# Patient Record
Sex: Female | Born: 1950 | Race: White | Hispanic: No | Smoking: Former smoker
Health system: Southern US, Community
[De-identification: ages and names within clinical notes are randomized; demographics above are authoritative.]

## PROBLEM LIST (undated history)

## (undated) DIAGNOSIS — J449 Chronic obstructive pulmonary disease, unspecified: Secondary | ICD-10-CM

## (undated) DIAGNOSIS — K529 Noninfective gastroenteritis and colitis, unspecified: Secondary | ICD-10-CM

## (undated) DIAGNOSIS — I10 Essential (primary) hypertension: Secondary | ICD-10-CM

## (undated) DIAGNOSIS — M810 Age-related osteoporosis without current pathological fracture: Secondary | ICD-10-CM

## (undated) HISTORY — DX: Age-related osteoporosis without current pathological fracture: M81.0

## (undated) HISTORY — PX: ELBOW SURGERY: SHX618

## (undated) HISTORY — DX: Chronic obstructive pulmonary disease, unspecified: J44.9

## (undated) HISTORY — PX: FOOT SURGERY: SHX648

## (undated) HISTORY — DX: Noninfective gastroenteritis and colitis, unspecified: K52.9

## (undated) HISTORY — PX: HAND SURGERY: SHX662

## (undated) HISTORY — PX: ABDOMINAL HYSTERECTOMY: SHX81

---

## 1997-10-08 ENCOUNTER — Emergency Department (HOSPITAL_COMMUNITY): Admission: EM | Admit: 1997-10-08 | Discharge: 1997-10-08 | Payer: Self-pay | Admitting: Emergency Medicine

## 1997-10-09 ENCOUNTER — Emergency Department (HOSPITAL_COMMUNITY): Admission: EM | Admit: 1997-10-09 | Discharge: 1997-10-09 | Payer: Self-pay | Admitting: Emergency Medicine

## 1997-10-11 ENCOUNTER — Encounter (HOSPITAL_COMMUNITY): Admission: RE | Admit: 1997-10-11 | Discharge: 1998-01-09 | Payer: Self-pay | Admitting: Emergency Medicine

## 1998-02-15 ENCOUNTER — Ambulatory Visit: Admission: RE | Admit: 1998-02-15 | Discharge: 1998-02-15 | Payer: Self-pay | Admitting: Family Medicine

## 1998-04-08 ENCOUNTER — Ambulatory Visit: Admission: RE | Admit: 1998-04-08 | Discharge: 1998-04-08 | Payer: Self-pay | Admitting: Family Medicine

## 1998-04-08 ENCOUNTER — Encounter: Payer: Self-pay | Admitting: Family Medicine

## 1998-10-27 ENCOUNTER — Ambulatory Visit (HOSPITAL_BASED_OUTPATIENT_CLINIC_OR_DEPARTMENT_OTHER): Admission: RE | Admit: 1998-10-27 | Discharge: 1998-10-27 | Payer: Self-pay | Admitting: Orthopedic Surgery

## 1999-03-15 ENCOUNTER — Encounter: Payer: Self-pay | Admitting: Family Medicine

## 1999-03-15 ENCOUNTER — Ambulatory Visit (HOSPITAL_COMMUNITY): Admission: RE | Admit: 1999-03-15 | Discharge: 1999-03-15 | Payer: Self-pay | Admitting: Family Medicine

## 1999-11-07 ENCOUNTER — Ambulatory Visit (HOSPITAL_BASED_OUTPATIENT_CLINIC_OR_DEPARTMENT_OTHER): Admission: RE | Admit: 1999-11-07 | Discharge: 1999-11-08 | Payer: Self-pay | Admitting: Orthopedic Surgery

## 2000-03-27 ENCOUNTER — Ambulatory Visit (HOSPITAL_COMMUNITY): Admission: RE | Admit: 2000-03-27 | Discharge: 2000-03-27 | Payer: Self-pay | Admitting: *Deleted

## 2000-03-27 ENCOUNTER — Encounter: Payer: Self-pay | Admitting: *Deleted

## 2001-04-24 ENCOUNTER — Encounter: Payer: Self-pay | Admitting: Family Medicine

## 2001-04-24 ENCOUNTER — Ambulatory Visit (HOSPITAL_COMMUNITY): Admission: RE | Admit: 2001-04-24 | Discharge: 2001-04-24 | Payer: Self-pay | Admitting: Family Medicine

## 2002-06-03 ENCOUNTER — Ambulatory Visit (HOSPITAL_COMMUNITY): Admission: RE | Admit: 2002-06-03 | Discharge: 2002-06-03 | Payer: Self-pay | Admitting: *Deleted

## 2002-06-03 ENCOUNTER — Encounter: Payer: Self-pay | Admitting: *Deleted

## 2002-07-08 ENCOUNTER — Encounter: Admission: RE | Admit: 2002-07-08 | Discharge: 2002-10-06 | Payer: Self-pay | Admitting: Family Medicine

## 2002-11-06 ENCOUNTER — Ambulatory Visit (HOSPITAL_BASED_OUTPATIENT_CLINIC_OR_DEPARTMENT_OTHER): Admission: RE | Admit: 2002-11-06 | Discharge: 2002-11-06 | Payer: Self-pay | Admitting: Orthopedic Surgery

## 2003-06-09 ENCOUNTER — Ambulatory Visit (HOSPITAL_COMMUNITY): Admission: RE | Admit: 2003-06-09 | Discharge: 2003-06-09 | Payer: Self-pay | Admitting: *Deleted

## 2003-07-21 ENCOUNTER — Ambulatory Visit (HOSPITAL_COMMUNITY): Admission: RE | Admit: 2003-07-21 | Discharge: 2003-07-21 | Payer: Self-pay | Admitting: Gastroenterology

## 2003-09-15 ENCOUNTER — Ambulatory Visit (HOSPITAL_BASED_OUTPATIENT_CLINIC_OR_DEPARTMENT_OTHER): Admission: RE | Admit: 2003-09-15 | Discharge: 2003-09-15 | Payer: Self-pay | Admitting: Family Medicine

## 2004-01-25 ENCOUNTER — Emergency Department (HOSPITAL_COMMUNITY): Admission: EM | Admit: 2004-01-25 | Discharge: 2004-01-25 | Payer: Self-pay | Admitting: Emergency Medicine

## 2004-07-18 ENCOUNTER — Ambulatory Visit (HOSPITAL_COMMUNITY): Admission: RE | Admit: 2004-07-18 | Discharge: 2004-07-18 | Payer: Self-pay | Admitting: *Deleted

## 2009-05-14 HISTORY — PX: ELBOW SURGERY: SHX618

## 2010-11-13 ENCOUNTER — Encounter (HOSPITAL_BASED_OUTPATIENT_CLINIC_OR_DEPARTMENT_OTHER)
Admission: RE | Admit: 2010-11-13 | Discharge: 2010-11-13 | Disposition: A | Discharge status: Home / Self Care | Payer: 59 | Source: Ambulatory Visit | Attending: Orthopedic Surgery | Admitting: Orthopedic Surgery

## 2010-11-13 LAB — BASIC METABOLIC PANEL
BUN: 8 mg/dL (ref 6–23)
CO2: 29 mEq/L (ref 19–32)
Chloride: 98 mEq/L (ref 96–112)
Glucose, Bld: 100 mg/dL — ABNORMAL HIGH (ref 70–99)
Potassium: 3.8 mEq/L (ref 3.5–5.1)
Sodium: 136 mEq/L (ref 135–145)

## 2010-11-16 ENCOUNTER — Other Ambulatory Visit: Payer: Self-pay | Admitting: Orthopedic Surgery

## 2010-11-16 ENCOUNTER — Ambulatory Visit (HOSPITAL_BASED_OUTPATIENT_CLINIC_OR_DEPARTMENT_OTHER)
Admission: RE | Admit: 2010-11-16 | Discharge: 2010-11-16 | Disposition: A | Discharge status: Home / Self Care | Payer: 59 | Source: Ambulatory Visit | Attending: Orthopedic Surgery | Admitting: Orthopedic Surgery

## 2010-11-16 DIAGNOSIS — M6530 Trigger finger, unspecified finger: Secondary | ICD-10-CM | POA: Insufficient documentation

## 2010-11-16 DIAGNOSIS — Z01812 Encounter for preprocedural laboratory examination: Secondary | ICD-10-CM | POA: Insufficient documentation

## 2010-11-16 DIAGNOSIS — F17200 Nicotine dependence, unspecified, uncomplicated: Secondary | ICD-10-CM | POA: Insufficient documentation

## 2010-11-16 DIAGNOSIS — I10 Essential (primary) hypertension: Secondary | ICD-10-CM | POA: Insufficient documentation

## 2010-11-16 DIAGNOSIS — Z01810 Encounter for preprocedural cardiovascular examination: Secondary | ICD-10-CM | POA: Insufficient documentation

## 2010-11-16 DIAGNOSIS — G4733 Obstructive sleep apnea (adult) (pediatric): Secondary | ICD-10-CM | POA: Insufficient documentation

## 2010-11-16 DIAGNOSIS — F3289 Other specified depressive episodes: Secondary | ICD-10-CM | POA: Insufficient documentation

## 2010-11-16 DIAGNOSIS — F329 Major depressive disorder, single episode, unspecified: Secondary | ICD-10-CM | POA: Insufficient documentation

## 2010-11-16 DIAGNOSIS — M659 Unspecified synovitis and tenosynovitis, unspecified site: Secondary | ICD-10-CM | POA: Insufficient documentation

## 2010-11-24 NOTE — Op Note (Signed)
Dawn Lowery, Dawn Lowery                 ACCOUNT NO.:  [id number]  MEDICAL RECORD NO.:  [id number]  LOCATION:                                 FACILITY:  PHYSICIAN:  Katy Fitch. Draysen Weygandt, M.D.      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  OPERATIONS: 1. Release of right long finger A1 pulley with synovectomy with     superficialis profundus tendons for synovial biopsy. 2. Release of right ring finger A1 pulley with limited synovectomy.  OPERATIONS:  Katy Fitch. Luellen Howson, MD  ASSISTANT:  Marveen Reeks Dasnoit, PA-C  ANESTHESIA:  General sedation/2% lidocaine flexor sheath block and palm block right long and ring fingers.  TOTAL VOLUME:  4.5 mL.  SUPERVISING ANESTHESIOLOGIST:  Janetta Hora. Gelene Mink, MD  INDICATIONS:  Tanaiya Kolarik is a 60 year old woman well acquainted with our practice.  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  She presented for evaluation and management of trigger fingers and hand numbness.  In April 2012, we performed detailed electrodiagnostic studies which did not reveal evidence of entrapment neuropathy.  Ms. 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  To date her evaluation of the symptoms has been nondiagnostic.  She has been treated with steroid injection without success.  Due to a failed respond to nonoperative measures, she is brought to the operating room at this time anticipating release of her right long and ring finger A1 pulleys.  Questions were invited and answered in detail in the preoperative holding  area.  PROCEDURE:  Kearie Mennen is brought to room 2 of the [facility] and placed in supine position upon the operating table.  Following preoperative evaluation by Dr. Gelene Mink, general anesthesia was declined and local anesthesia with sedation recommended and accepted.  Under Dr. Gelene Mink supervision, IV sedation was provided and followed by routine Betadine prep of the right hand.  Lidocaine 2% was infiltrated into the path intended incisions and into the flexor sheaths of the right long and ring fingers.  After few moments, excellent anesthesia was achieved.  The right arm was then prepped with Betadine soap solution and sterilely draped.  A pneumatic tourniquet was applied to proximal brachium. Following exsanguination of the right arm with Esmarch bandage, arterial tourniquet was inflated to 120 mmHg.  After routine surgical time-out, short oblique incisions were fashioned directly over the palpably thickened A1 pulleys.  The pretendinous fibers of the palmar fascia was released to the long and ring fingers.  Inspection of the flexor sheaths at the A1 pulley revealed rather significant tenosynovitis proximal to the A1 pulleys of both the long and ring fingers.  The A1 pulley was isolated, split with scalpel and scissors and a proximal cuff of tenosynovitis resected with scissors dissection from the superficialis profundus tendons of the long and ring fingers.  The specimen from  the long finger was placed in formalin and passed off the pathologic evaluation.  There was minor tendinopathy of the superficialis tendon.  After completion of the A1 pulley release and synovectomy, Ms. Evon demonstrated full active range of motion of her fingers without residual triggering.  Careful inspection of the index and small fingers revealed a slight crepitation suggesting synovitis in these tendon sheath as well.  The wounds were then inspected for bleeding points and  repaired with mattress sutures of 5-0 nylon.  Ms. Yano was placed in compressive dressing with Xeroflo sterile gauze and Ace wrap.  For aftercare were going to advise immediate range of motion excises.  We are going to suggest that her primary care physician consider a further evaluation for possible inflammatory arthritis. Question invited and answered in detail.  She is provided prescription for Dilaudid 2 mg 1 p.o. q.4-6 h. p.r.n. pain 20 tablets without refill.     Katy Fitch Neel Buffone, M.D.    RVS/MEDQ  D:  11/16/2010  T:  11/16/2010  Job:  [id number]  Electronically Signed by Josephine Igo M.D. on 11/24/2010 08:40:01 AM

## 2017-12-12 ENCOUNTER — Emergency Department (HOSPITAL_BASED_OUTPATIENT_CLINIC_OR_DEPARTMENT_OTHER)
Admission: EM | Admit: 2017-12-12 | Discharge: 2017-12-12 | Disposition: A | Discharge status: Home / Self Care | Payer: Self-pay | Attending: Emergency Medicine | Admitting: Emergency Medicine

## 2017-12-12 ENCOUNTER — Other Ambulatory Visit: Payer: Self-pay

## 2017-12-12 ENCOUNTER — Encounter (HOSPITAL_BASED_OUTPATIENT_CLINIC_OR_DEPARTMENT_OTHER): Payer: Self-pay

## 2017-12-12 DIAGNOSIS — I159 Secondary hypertension, unspecified: Secondary | ICD-10-CM | POA: Insufficient documentation

## 2017-12-12 DIAGNOSIS — Z79899 Other long term (current) drug therapy: Secondary | ICD-10-CM | POA: Insufficient documentation

## 2017-12-12 DIAGNOSIS — N390 Urinary tract infection, site not specified: Secondary | ICD-10-CM | POA: Insufficient documentation

## 2017-12-12 DIAGNOSIS — F17210 Nicotine dependence, cigarettes, uncomplicated: Secondary | ICD-10-CM | POA: Insufficient documentation

## 2017-12-12 HISTORY — DX: Essential (primary) hypertension: I10

## 2017-12-12 LAB — BASIC METABOLIC PANEL
Anion gap: 9 (ref 5–15)
BUN: 9 mg/dL (ref 8–23)
CALCIUM: 9.5 mg/dL (ref 8.9–10.3)
CO2: 30 mmol/L (ref 22–32)
Chloride: 101 mmol/L (ref 98–111)
Creatinine, Ser: 0.72 mg/dL (ref 0.44–1.00)
GFR calc non Af Amer: >60 mL/min (ref >60)
Glucose, Bld: 95 mg/dL (ref 70–99)
Potassium: 3.6 mmol/L (ref 3.5–5.1)
SODIUM: 140 mmol/L (ref 135–145)

## 2017-12-12 LAB — CBC WITH DIFFERENTIAL/PLATELET
BASOS PCT: 0 %
Basophils Absolute: 0 10*3/uL (ref 0.0–0.1)
EOS ABS: 0.1 10*3/uL (ref 0.0–0.7)
EOS PCT: 2 %
HCT: 44.8 % (ref 36.0–46.0)
Hemoglobin: 15.6 g/dL — ABNORMAL HIGH (ref 12.0–15.0)
LYMPHS ABS: 1.4 10*3/uL (ref 0.7–4.0)
Lymphocytes Relative: 23 %
MCH: 31.8 pg (ref 26.0–34.0)
MCHC: 34.8 g/dL (ref 30.0–36.0)
MCV: 91.2 fL (ref 78.0–100.0)
MONO ABS: 0.5 10*3/uL (ref 0.1–1.0)
MONOS PCT: 8 %
Neutro Abs: 4.1 10*3/uL (ref 1.7–7.7)
Neutrophils Relative %: 67 %
PLATELETS: 219 10*3/uL (ref 150–400)
RBC: 4.91 MIL/uL (ref 3.87–5.11)
RDW: 12.6 % (ref 11.5–15.5)
WBC: 6.1 10*3/uL (ref 4.0–10.5)

## 2017-12-12 LAB — URINALYSIS, ROUTINE W REFLEX MICROSCOPIC
BILIRUBIN URINE: NEGATIVE
Glucose, UA: NEGATIVE mg/dL
HGB URINE DIPSTICK: NEGATIVE
KETONES UR: NEGATIVE mg/dL
NITRITE: NEGATIVE
PH: 7 (ref 5.0–8.0)
Protein, ur: NEGATIVE mg/dL
SPECIFIC GRAVITY, URINE: 1.01 (ref 1.005–1.030)

## 2017-12-12 LAB — URINALYSIS, MICROSCOPIC (REFLEX)

## 2017-12-12 MED ORDER — LISINOPRIL 10 MG PO TABS: 20.0000 mg | ORAL_TABLET | Freq: Every day | ORAL | 1 refills | Status: DC

## 2017-12-12 MED ORDER — LOVASTATIN 40 MG PO TABS: 40.0000 mg | ORAL_TABLET | Freq: Every day | ORAL | 1 refills | Status: DC

## 2017-12-12 MED ORDER — CEPHALEXIN 500 MG PO CAPS: 500.0000 mg | ORAL_CAPSULE | Freq: Two times a day (BID) | ORAL | 0 refills | Status: DC

## 2017-12-12 MED ORDER — HYDROCHLOROTHIAZIDE 12.5 MG PO TABS: 12.5000 mg | ORAL_TABLET | Freq: Every day | ORAL | 1 refills | Status: DC

## 2017-12-12 NOTE — ED Triage Notes (Signed)
Pt c/o elevated BP and HA-hx of HTN-out of meds x "months"-NAD-steady gait

## 2017-12-12 NOTE — Discharge Instructions (Addendum)
Take antibiotics as prescribed until all gone for urinary tract infection.  Take your blood pressure medications as prescribed.  Low-sodium diet.  Follow-up with family doctor.  Return if worsening symptoms.  Avoid ibuprofen for headaches, he can take Tylenol or Excedrin Migraine.

## 2017-12-12 NOTE — ED Notes (Signed)
ED Provider at bedside. 

## 2017-12-12 NOTE — ED Provider Notes (Signed)
[facility] EMERGENCY DEPARTMENT Provider Note   CSN: [id number] Arrival date & time: 12/12/17  1514     History   Chief Complaint Chief Complaint  Patient presents with  . Hypertension    HPI Dawn Lowery is a 67 y.o. female.  HPI Dawn Lowery is a 67 y.o. female presents to ED with elevated BP. States she moved here about a years ago and has not found PCP yet. States ran out of medications several months ago. PT takes HCTZ, lisinopril, lovastatin. Reports she has had mild headache for several weeks. Yesterday headache worsened, states had associated dizziness and nausea. Tried ibuprofen which helped. States today headache is better but still present. No focal neurological complaints. No changes in vision. No numbness or weakness in extremities. No chest pain or SOB. States she is having some urinary symptoms, states dribbles when she goes and only small amount at a time. States has a BP cuff at home and BPs have been very high in 409W systolic.   Past Medical History:  Diagnosis Date  . Hypertension     There are no active problems to display for this patient.   Past Surgical History:  Procedure Laterality Date  . ABDOMINAL HYSTERECTOMY    . ELBOW SURGERY       OB History   None      Home Medications    Prior to Admission medications   Medication Sig Start Date End Date Taking? Authorizing Provider  hydrochlorothiazide (HYDRODIURIL) 25 MG tablet Take 25 mg by mouth daily.   Yes [provider]  lisinopril (PRINIVIL,ZESTRIL) 10 MG tablet Take 10 mg by mouth daily.   Yes [provider]    Family History No family history on file.  Social History Social History   Tobacco Use  . Smoking status: Current Every Day Smoker  . Smokeless tobacco: Never Used  Substance Use Topics  . Alcohol use: Yes    Comment: occ  . Drug use: Never     Allergies   Sulfa antibiotics   Review of Systems Review of Systems  Constitutional:  Negative for chills and fever.  Respiratory: Negative for cough, chest tightness and shortness of breath.   Cardiovascular: Negative for chest pain, palpitations and leg swelling.  Gastrointestinal: Negative for abdominal pain, diarrhea, nausea and vomiting.  Genitourinary: Positive for decreased urine volume and urgency. Negative for dysuria, flank pain, pelvic pain, vaginal bleeding, vaginal discharge and vaginal pain.  Musculoskeletal: Negative for arthralgias, myalgias, neck pain and neck stiffness.  Skin: Negative for rash.  Neurological: Positive for headaches. Negative for dizziness and weakness.  All other systems reviewed and are negative.    Physical Exam Updated Vital Signs BP (!) 191/110 (BP Location: Left Arm)   Pulse 78   Temp 98.5 F (36.9 C) (Oral)   Resp 18   Ht 5\' 5"  (1.651 m)   Wt 75.3 kg (166 lb)   SpO2 100%   BMI 27.62 kg/m   Physical Exam  Constitutional: She is oriented to person, place, and time. She appears well-developed and well-nourished. No distress.  HENT:  Head: Normocephalic.  Eyes: Conjunctivae are normal.  Neck: Neck supple.  Cardiovascular: Normal rate, regular rhythm and normal heart sounds.  Pulmonary/Chest: Effort normal and breath sounds normal. No respiratory distress. She has no wheezes. She has no rales.  Abdominal: Soft. Bowel sounds are normal. She exhibits no distension. There is no tenderness. There is no rebound.  Musculoskeletal: She exhibits no edema.  Neurological: She is alert and oriented to person, place, and time.  Skin: Skin is warm and dry.  Psychiatric: She has a normal mood and affect. Her behavior is normal.  Nursing note and vitals reviewed.    ED Treatments / Results  Labs (all labs ordered are listed, but only abnormal results are displayed) Labs Reviewed  CBC WITH DIFFERENTIAL/PLATELET - Abnormal; Notable for the following components:      Result Value   Hemoglobin 15.6 (*)    All other components within  normal limits  URINALYSIS, ROUTINE W REFLEX MICROSCOPIC - Abnormal; Notable for the following components:   Leukocytes, UA MODERATE (*)    All other components within normal limits  URINALYSIS, MICROSCOPIC (REFLEX) - Abnormal; Notable for the following components:   Bacteria, UA FEW (*)    All other components within normal limits  BASIC METABOLIC PANEL    EKG EKG Interpretation  Date/Time:  Thursday December 12 2017 16:05:58 EDT Ventricular Rate:  72 PR Interval:  130 QRS Duration: 82 QT Interval:  418 QTC Calculation: 457 R Axis:   52 Text Interpretation:  Normal sinus rhythm Normal ECG No significant change since last tracing Confirmed by Blanchie Dessert [phone]) on 12/12/2017 4:09:09 PM   Radiology No results found.  Procedures Procedures (including critical care time)  Medications Ordered in ED Medications - No data to display   Initial Impression / Assessment and Plan / ED Course  I have reviewed the triage vital signs and the nursing notes.  Pertinent labs & imaging results that were available during my care of the patient were reviewed by me and considered in my medical decision making (see chart for details).     Pt in ed with elevated BPs at home. Mild headaches for last few days, minimal now. Normal neurological exam. Will check ECG, renal function. I do not think she has [place] and do not think she needs and brain imaging at this time. She is non toxic NAD.   Urinalysis is suggestive of infection.  Her labs are unremarkable.  EKG is normal.  Plan to discharge home, will refill her medications for her blood pressure.  She states she has someone that she is planning on seeing as a primary care doctor, instructed to follow-up.  Return precautions discussed.  Vitals:   12/12/17 1520 12/12/17 1521  BP: (!) 191/110   Pulse: 78   Resp: 18   Temp: 98.5 F (36.9 C)   TempSrc: Oral   SpO2: 100%   Weight:  75.3 kg (166 lb)  Height:  5\' 5"  (1.651 m)     Final  Clinical Impressions(s) / ED Diagnoses   Final diagnoses:  Secondary hypertension  Urinary tract infection without hematuria, site unspecified    ED Discharge Orders        Ordered    hydrochlorothiazide (HYDRODIURIL) 12.5 MG tablet  Daily     12/12/17 1714    lisinopril (PRINIVIL,ZESTRIL) 10 MG tablet  Daily     12/12/17 1714    lovastatin (MEVACOR) 40 MG tablet  Daily at bedtime     12/12/17 1714    cephALEXin (KEFLEX) 500 MG capsule  2 times daily     12/12/17 1714       Jeannett Senior, PA-C 12/12/17 1716    Blanchie Dessert, MD 12/13/17 [phone]

## 2020-01-29 ENCOUNTER — Ambulatory Visit (INDEPENDENT_AMBULATORY_CARE_PROVIDER_SITE_OTHER): Payer: Self-pay | Admitting: Family

## 2020-01-29 ENCOUNTER — Other Ambulatory Visit: Payer: Self-pay

## 2020-01-29 ENCOUNTER — Encounter: Payer: Self-pay | Admitting: Family

## 2020-01-29 VITALS — BP 155/93 | HR 86 | Temp 98.5°F | Resp 16 | Ht 64.0 in | Wt 177.6 lb

## 2020-01-29 DIAGNOSIS — I10 Essential (primary) hypertension: Secondary | ICD-10-CM

## 2020-01-29 DIAGNOSIS — Z720 Tobacco use: Secondary | ICD-10-CM

## 2020-01-29 DIAGNOSIS — E785 Hyperlipidemia, unspecified: Secondary | ICD-10-CM

## 2020-01-29 DIAGNOSIS — Z7185 Encounter for immunization safety counseling: Secondary | ICD-10-CM

## 2020-01-29 DIAGNOSIS — Z7189 Other specified counseling: Secondary | ICD-10-CM

## 2020-01-29 LAB — BASIC METABOLIC PANEL
BUN: 9 mg/dL (ref 7–25)
CO2: 30 mmol/L (ref 20–32)
Calcium: 9.8 mg/dL (ref 8.6–10.4)
Chloride: 104 mmol/L (ref 98–110)
Creat: 0.78 mg/dL (ref 0.50–0.99)
Glucose, Bld: 86 mg/dL (ref 65–99)
Potassium: 5.2 mmol/L (ref 3.5–5.3)
Sodium: 140 mmol/L (ref 135–146)

## 2020-01-29 MED ORDER — LISINOPRIL 10 MG PO TABS: 10.0000 mg | ORAL_TABLET | Freq: Every day | ORAL | 1 refills | Status: DC

## 2020-01-29 MED ORDER — HYDROCHLOROTHIAZIDE 12.5 MG PO TABS: 12.5000 mg | ORAL_TABLET | Freq: Every day | ORAL | 1 refills | Status: DC

## 2020-01-29 MED ORDER — LOVASTATIN 40 MG PO TABS: 40.0000 mg | ORAL_TABLET | Freq: Every day | ORAL | 1 refills | Status: DC

## 2020-01-29 NOTE — Patient Instructions (Addendum)
Please complete lab work prior to leaving.  Restart your medications.  Welcome to [facility]!

## 2020-01-29 NOTE — Progress Notes (Signed)
Subjective:    Patient ID: Dawn Lowery, female    DOB: 15-Aug-1950, 69 y.o.   MRN: [id number]  HPI  Patient is a 69 yr ol female who presents today to establish care. States she moved here 2 years ago from [place]. She is originally from this area and wanted to return to be closer to her daughter.   HTN- reports that she ran out of her medication about 6 months ago. States she knows that her blood pressure has been elevated. States she feels flush/palpitations when it is elevated.    BP Readings from Last 3 Encounters:  01/29/20 (!) 155/93  12/12/17 (!) 191/110   Used to be a heavy drinker, does not drink anymore since she retired.  Smokes 1/2 PPD (started smoking at age 69) Not ready to quit.  "I enjoy smoking"  Declines flu shot.   Hyperlipidemia- previously on mevacor. She tolerated without difficulty.  Review of Systems  Constitutional: Negative for unexpected weight change.  HENT: Negative for hearing loss and rhinorrhea.   Eyes: Negative for visual disturbance.  Respiratory: Negative for cough.   Cardiovascular: Negative for chest pain.  Gastrointestinal: Negative for constipation and diarrhea.  Genitourinary: Negative for dysuria, frequency and hematuria.  Musculoskeletal: Negative for arthralgias and myalgias.  Skin: Negative for rash.  Neurological: Negative for headaches.  Hematological: Negative for adenopathy.  Psychiatric/Behavioral:       Denies depression/anxiety       Past Medical History:  Diagnosis Date   Hypertension      Social History   Socioeconomic History   Marital status: Single    Spouse name: Not on file   Number of children: Not on file   Years of education: Not on file   Highest education level: Not on file  Occupational History   Not on file  Tobacco Use   Smoking status: Current Every Day Smoker    Types: Cigarettes   Smokeless tobacco: Never Used  Substance and Sexual Activity   Alcohol use: Not Currently   Drug  use: Never   Sexual activity: Not Currently  Other Topics Concern   Not on file  Social History Narrative   Not on file   Social Determinants of Health   Financial Resource Strain:    Difficulty of Paying Living Expenses: Not on file  Food Insecurity:    Worried About [place] in the Last Year: Not on file   Ran Out of Food in the Last Year: Not on file  Transportation Needs:    Lack of Transportation (Medical): Not on file   Lack of Transportation (Non-Medical): Not on file  Physical Activity:    Days of Exercise per Week: Not on file   Minutes of Exercise per Session: Not on file  Stress:    Feeling of Stress : Not on file  Social Connections:    Frequency of Communication with Friends and Family: Not on file   Frequency of Social Gatherings with Friends and Family: Not on file   Attends Religious Services: Not on file   Active Member of Clubs or Organizations: Not on file   Attends Archivist Meetings: Not on file   Marital Status: Not on file  Intimate Partner Violence:    Fear of Current or Ex-Partner: Not on file   Emotionally Abused: Not on file   Physically Abused: Not on file   Sexually Abused: Not on file    Past Surgical History:  Procedure Laterality  Date   ABDOMINAL HYSTERECTOMY     ELBOW SURGERY     FOOT SURGERY Right    big toe   HAND SURGERY Left    thumb surgery    History reviewed. No pertinent family history.  Allergies  Allergen Reactions   Sulfa Antibiotics Hives    Current Outpatient Medications on File Prior to Visit  Medication Sig Dispense Refill   hydrochlorothiazide (HYDRODIURIL) 12.5 MG tablet Take 1 tablet (12.5 mg total) by mouth daily. (Patient not taking: Reported on 01/29/2020) 30 tablet 1   lisinopril (PRINIVIL,ZESTRIL) 10 MG tablet Take 2 tablets (20 mg total) by mouth daily. (Patient not taking: Reported on 01/29/2020) 30 tablet 1   lovastatin (MEVACOR) 40 MG tablet Take 1  tablet (40 mg total) by mouth at bedtime. (Patient not taking: Reported on 01/29/2020) 30 tablet 1   No current facility-administered medications on file prior to visit.    BP (!) 155/93 (BP Location: Right Arm, Patient Position: Sitting, Cuff Size: Small)    Pulse 86    Temp 98.5 F (36.9 C) (Oral)    Resp 16    Ht 5\' 4"  (1.626 m)    Wt 177 lb 9.6 oz (80.6 kg)    SpO2 100%    BMI 30.48 kg/m    Objective:   Physical Exam Constitutional:      Appearance: She is well-developed.  Neck:     Thyroid: No thyromegaly.  Cardiovascular:     Rate and Rhythm: Normal rate and regular rhythm.     Heart sounds: Normal heart sounds. No murmur heard.   Pulmonary:     Effort: Pulmonary effort is normal. No respiratory distress.     Breath sounds: Normal breath sounds. No wheezing.  Musculoskeletal:        General: No swelling.     Cervical back: Neck supple.  Skin:    General: Skin is warm and dry.  Neurological:     Mental Status: She is alert and oriented to person, place, and time.  Psychiatric:        Behavior: Behavior normal.        Thought Content: Thought content normal.        Judgment: Judgment normal.           Assessment & Plan:  HTN-  Uncontrolled.  Restart hctz and lisinopril.  Check bmet to assess renal function.  Hyperlipidemia- restart mevacor. Plan to check lipid panel when she follow up.  Tobacco abuse- discussed importance of smoking cessation. She is not currently ready to quit.  Immunization counseling- she has not been vaccinated against covid.  Pt counseled on importance of protecting herself from covid through vaccination. She will consider.  40 minutes spent on today's visit. The majority of this time was spent counseling patient and obtaining history.  This visit occurred during the SARS-CoV-2 public health emergency.  Safety protocols were in place, including screening questions prior to the visit, additional usage of staff PPE, and extensive cleaning of  exam room while observing appropriate contact time as indicated for disinfecting solutions.

## 2020-01-30 ENCOUNTER — Encounter: Payer: Self-pay | Admitting: Family

## 2020-02-01 NOTE — Progress Notes (Signed)
Mailed out to patient 

## 2020-02-29 ENCOUNTER — Ambulatory Visit: Payer: Self-pay | Admitting: Family

## 2020-03-04 ENCOUNTER — Telehealth: Payer: Self-pay | Admitting: Family

## 2020-03-04 ENCOUNTER — Other Ambulatory Visit: Payer: Self-pay

## 2020-03-04 ENCOUNTER — Ambulatory Visit (INDEPENDENT_AMBULATORY_CARE_PROVIDER_SITE_OTHER): Payer: Self-pay | Admitting: Family

## 2020-03-04 VITALS — BP 144/69 | HR 68 | Temp 98.5°F | Resp 16 | Ht 64.0 in | Wt 174.0 lb

## 2020-03-04 DIAGNOSIS — E1169 Type 2 diabetes mellitus with other specified complication: Secondary | ICD-10-CM

## 2020-03-04 DIAGNOSIS — Z0000 Encounter for general adult medical examination without abnormal findings: Secondary | ICD-10-CM

## 2020-03-04 DIAGNOSIS — Z1231 Encounter for screening mammogram for malignant neoplasm of breast: Secondary | ICD-10-CM

## 2020-03-04 DIAGNOSIS — E785 Hyperlipidemia, unspecified: Secondary | ICD-10-CM

## 2020-03-04 DIAGNOSIS — I10 Essential (primary) hypertension: Secondary | ICD-10-CM

## 2020-03-04 NOTE — Telephone Encounter (Signed)
Could you please check status of her record's release? I don't think I have received her records. tks

## 2020-03-04 NOTE — Progress Notes (Signed)
Subjective:    Patient ID: Dawn Lowery, female    DOB: Dec 09, 1950, 69 y.o.   MRN: [id number]  HPI   Patient is a 69 yr old female who presents today for follow up.  HTN- last visit we restarted her hctz and lisinopril.  BP Readings from Last 3 Encounters:  03/04/20 (!) 144/69  01/29/20 (!) 155/93  12/12/17 (!) 191/110   Hyperlipidemia- back on mevcor  Reports HA on the left side of her head.  Describes pounding.  Denies nausea/vomitting.  Review of Systems See HPI  Past Medical History:  Diagnosis Date  . Hypertension      Social History   Socioeconomic History  . Marital status: Single    Spouse name: Not on file  . Number of children: Not on file  . Years of education: Not on file  . Highest education level: Not on file  Occupational History  . Not on file  Tobacco Use  . Smoking status: Current Every Day Smoker    Types: Cigarettes  . Smokeless tobacco: Never Used  Substance and Sexual Activity  . Alcohol use: Not Currently  . Drug use: Never  . Sexual activity: Not Currently  Other Topics Concern  . Not on file  Social History Narrative   Retired Engineer, structural   Son- lives in [place]   Daughter lives locally   5 grandchildren and 1 great grandson   Enjoys spending time with her daughter and relaxing.    2 dogs   Single   Completed HS   Social Determinants of Health   Financial Resource Strain:   . Difficulty of Paying Living Expenses: Not on file  Food Insecurity:   . Worried About [place] fund[place] in the Last Year: Not on file  . Ran Out of Food in the Last Year: Not on file  Transportation Needs:   . Lack of Transportation (Medical): Not on file  . Lack of Transportation (Non-Medical): Not on file  Physical Activity:   . Days of Exercise per Week: Not on file  . Minutes of Exercise per Session: Not on file  Stress:   . Feeling of Stress : Not on file  Social Connections:   . Frequency of Communication with Friends and Family: Not  on file  . Frequency of Social Gatherings with Friends and Family: Not on file  . Attends Religious Services: Not on file  . Active Member of Clubs or Organizations: Not on file  . Attends Archivist Meetings: Not on file  . Marital Status: Not on file  Intimate Partner Violence:   . Fear of Current or Ex-Partner: Not on file  . Emotionally Abused: Not on file  . Physically Abused: Not on file  . Sexually Abused: Not on file    Past Surgical History:  Procedure Laterality Date  . ABDOMINAL HYSTERECTOMY    . ELBOW SURGERY Right 2011   tennis elbow  . FOOT SURGERY Right    big toe- pinning due to fracture  . HAND SURGERY Left    thumb surgery    Family History  Problem Relation Age of Onset  . Lung cancer Mother   . Lung cancer Father   . Obesity Sister   . Hypertension Sister        [name]  . Lung cancer Paternal Grandfather     Allergies  Allergen Reactions  . Sulfa Antibiotics Hives    Current Outpatient Medications on File Prior to Visit  Medication Sig Dispense Refill  . hydrochlorothiazide (HYDRODIURIL) 12.5 MG tablet Take 1 tablet (12.5 mg total) by mouth daily. 90 tablet 1  . lisinopril (ZESTRIL) 10 MG tablet Take 1 tablet (10 mg total) by mouth daily. 90 tablet 1  . lovastatin (MEVACOR) 40 MG tablet Take 1 tablet (40 mg total) by mouth at bedtime. 90 tablet 1   No current facility-administered medications on file prior to visit.    BP (!) 144/69 (BP Location: Right Arm, Patient Position: Sitting, Cuff Size: Small)   Pulse 68   Temp 98.5 F (36.9 C) (Oral)   Resp 16   Ht 5\' 4"  (1.626 m)   Wt 174 lb (78.9 kg)   SpO2 100%   BMI 29.87 kg/m       Objective:   Physical Exam Constitutional:      Appearance: She is well-developed.  Neck:     Thyroid: No thyromegaly.  Cardiovascular:     Rate and Rhythm: Normal rate and regular rhythm.     Heart sounds: Normal heart sounds. No murmur heard.   Pulmonary:     Effort: Pulmonary  effort is normal. No respiratory distress.     Breath sounds: Normal breath sounds. No wheezing.  Musculoskeletal:     Cervical back: Neck supple.  Skin:    General: Skin is warm and dry.  Neurological:     Mental Status: She is alert and oriented to person, place, and time.  Psychiatric:        Behavior: Behavior normal.        Thought Content: Thought content normal.        Judgment: Judgment normal.           Assessment & Plan:  HTN- bp improved back on medication. Continue hctz 12.5 and lisinopril 10mg  once daily. Check follow up bmet.   Hyperlipidemia- back on statin. Obtain follow up lipid panel.   Will refer for mammogram.  She is uninsured currently and reports that colo is up to date.  Recommended flu shot at the pharmacy. Commended her on receiving the Moderna vaccine.  This visit occurred during the SARS-CoV-2 public health emergency.  Safety protocols were in place, including screening questions prior to the visit, additional usage of staff PPE, and extensive cleaning of exam room while observing appropriate contact time as indicated for disinfecting solutions.

## 2020-03-04 NOTE — Telephone Encounter (Signed)
Records released faxed to previous pcp, provider and fax number confirmed on the phone

## 2020-03-05 LAB — BASIC METABOLIC PANEL
BUN/Creatinine Ratio: 8 (calc) (ref 6–22)
BUN: 6 mg/dL — ABNORMAL LOW (ref 7–25)
CO2: 30 mmol/L (ref 20–32)
Calcium: 9.3 mg/dL (ref 8.6–10.4)
Chloride: 103 mmol/L (ref 98–110)
Creat: 0.77 mg/dL (ref 0.50–0.99)
Glucose, Bld: 88 mg/dL (ref 65–99)
Potassium: 4.2 mmol/L (ref 3.5–5.3)
Sodium: 138 mmol/L (ref 135–146)

## 2020-03-05 LAB — LIPID PANEL
Cholesterol: 166 mg/dL (ref <200)
HDL: 45 mg/dL — ABNORMAL LOW (ref >=50)
LDL Cholesterol (Calc): 95 mg/dL (calc)
Non-HDL Cholesterol (Calc): 121 mg/dL (calc) (ref <130)
Total CHOL/HDL Ratio: 3.7 (calc) (ref <5.0)
Triglycerides: 165 mg/dL — ABNORMAL HIGH (ref <150)

## 2020-05-14 DIAGNOSIS — I639 Cerebral infarction, unspecified: Secondary | ICD-10-CM

## 2020-05-14 HISTORY — DX: Cerebral infarction, unspecified: I63.9

## 2020-05-16 ENCOUNTER — Telehealth: Payer: Self-pay

## 2020-05-16 NOTE — Telephone Encounter (Signed)
FYI Looks like patient is at ed now

## 2020-05-16 NOTE — Telephone Encounter (Signed)
Nurse Assessment Nurse: Risa Grill, RN, Lambert Mody Date/Time Lamount Cohen Time): 05/16/2020 8:47:27 AM Confirm and document reason for call. If symptomatic, describe symptoms. ---Caller states she had an episode of slurry speech and couldn't remember pass code for the tv yesterday . The paramedics said she probably had a TIA and advised to go to the hospital, but the emergency room had a 12-14 hour wait. She opted not to go . She is having weakness in her right arm today. hurt her right arm 2-3 weeks ago. Does the patient have any new or worsening symptoms? ---Yes Will a triage be completed? ---Yes Related visit to physician within the last 2 weeks? ---No Does the PT have any chronic conditions? (i.e. diabetes, asthma, this includes High risk factors for pregnancy, etc.) ---Yes List chronic conditions. ---HTN Is this a behavioral health or substance abuse call? ---No Guidelines Guideline Title Affirmed Question Affirmed Notes Nurse Date/Time (Eastern Time) Neurologic Deficit [1] Weakness (i.e., paralysis, loss of muscle strength) of the face, arm / Risa Grill, RN, Tracie 05/16/2020 8:49:47 AM PLEASE NOTE: All timestamps contained within this report are represented as [place] Standard Time. CONFIDENTIALTY NOTICE: This fax transmission is intended only for the addressee. It contains information that is legally privileged, confidential or otherwise protected from use or disclosure. If you are not the intended recipient, you are strictly prohibited from reviewing, disclosing, copying using or disseminating any of this information or taking any action in reliance on or regarding this information. If you have received this fax in error, please notify us immediately by telephone so that we can arrange for its return to [name]. Phone: [phone], Toll-Free: [phone], Fax: [phone] Page: 2 of 2 Call Id: [id number] Guidelines Guideline Title Affirmed Question Affirmed Notes Nurse Date/Time  Lamount Cohen Time) hand, or leg / foot on one side of the body AND [2] sudden onset AND [3] brief (now gone) Disp. Time Lamount Cohen Time) Disposition Final User 05/16/2020 8:45:41 AM Send to Urgent Queue Talmadge Coventry 05/16/2020 8:52:46 AM Go to ED Now (or PCP triage) Yes Risa Grill, RN, Cyndia Diver Disagree/Comply Comply Caller Understands Yes PreDisposition Did not know what to do Care Advice Given Per Guideline GO TO ED NOW (OR PCP TRIAGE): * IF NO PCP (PRIMARY CARE PROVIDER) SECOND-LEVEL TRIAGE: You need to be seen within the next hour. Go to the ED/UCC at _____________ Hospital. Leave as soon as you can. ANOTHER ADULT SHOULD DRIVE: * It is better and safer if another adult drives instead of you. CARE ADVICE given per Neurologic Deficit (Adult) guideline. Referrals GO TO FACILITY OTHER - SPECIFY

## 2020-05-16 NOTE — Telephone Encounter (Signed)
Pt called stating she experienced TIA symptoms yesterday, 05/15/20 and called EMS.  EMS advised pt she would be waiting at the hospital for hours and pt declined to be transported.  She called office this morning stating she is still experiencing some slight symptoms and wanted to know if PCP could advise without sending pt to ER. I advised pt I would have to transfer her to our nurse triage for further evaluation.  Pt transferred to Triage.

## 2020-06-06 ENCOUNTER — Encounter: Payer: Self-pay | Admitting: Family

## 2020-06-13 ENCOUNTER — Telehealth: Payer: Self-pay | Admitting: Family

## 2020-06-13 ENCOUNTER — Other Ambulatory Visit: Payer: Self-pay

## 2020-06-13 ENCOUNTER — Telehealth (INDEPENDENT_AMBULATORY_CARE_PROVIDER_SITE_OTHER): Payer: Medicaid Other | Admitting: Family

## 2020-06-13 DIAGNOSIS — E538 Deficiency of other specified B group vitamins: Secondary | ICD-10-CM

## 2020-06-13 DIAGNOSIS — I10 Essential (primary) hypertension: Secondary | ICD-10-CM

## 2020-06-13 DIAGNOSIS — E785 Hyperlipidemia, unspecified: Secondary | ICD-10-CM | POA: Diagnosis not present

## 2020-06-13 DIAGNOSIS — I63412 Cerebral infarction due to embolism of left middle cerebral artery: Secondary | ICD-10-CM

## 2020-06-13 DIAGNOSIS — Z720 Tobacco use: Secondary | ICD-10-CM | POA: Diagnosis not present

## 2020-06-13 NOTE — Telephone Encounter (Signed)
Patient scheduled to come in for labs 06-17-2020

## 2020-06-13 NOTE — Telephone Encounter (Signed)
Can you please schedule her a lab visit?

## 2020-06-13 NOTE — Progress Notes (Signed)
Virtual Visit via Video Note  I connected with Dawn Lowery on 06/13/20 at 11:20 AM EST by a video enabled telemedicine application and verified that I am speaking with the correct person using two identifiers.  Location: Patient: home Provider: work   I discussed the limitations of evaluation and management by telemedicine and the availability of in person appointments. The patient expressed understanding and agreed to proceed. Only the patient and myself were present for today's video call.   History of Present Illness:   Patient is a 70 year old female who presents today for hospital follow-up.  She was admitted to [facility] January 3 through May 19, 2020.  She was diagnosed with a left hemispheric embolic stroke.  Upon arrival she reported 2 episodes of transient aphasia as well as right-sided arm weakness.  She was not felt to be a TPA candidate.  MRI was performed which showed 9 acute/early subacute cortical and subcortical infarcts within the left MCA territory.  Neurology was consulted.  She underwent a CT angio of the head and neck which showed multifocal left MCA territory/watershed infarcts and no large vessel occlusion.  There was noted to be moderate to severe left distal P2/P3 segment narrowing and mild to moderate narrowing of the bilateral M2 segments and proximal superior cerebellar arteries and mild left M1 segment and mid basilar artery narrowing.  Her statin dose was increased to meet goal of LDL less than 70.  Also her lisinopril was increased for improved blood pressure control.  She was discharged home with aspirin and Plavix for 90 days which should continue until August 14, 2020 at which time she can be transitioned to aspirin 325 mg daily.  It was also recommended that she pick up a Zio patch as an outpatient to assess for atrial fibrillation.  She reports that her right sided weakness is much better. She is able to use her right hand.  She did not  require physical therapy at the time of discharge.  She completed her zio monitor. She is waiting to hear back on her results. These results are not available for review in care everywhere.  Past Medical History:  Diagnosis Date  . Hypertension      Social History   Socioeconomic History  . Marital status: Single    Spouse name: Not on file  . Number of children: Not on file  . Years of education: Not on file  . Highest education level: Not on file  Occupational History  . Not on file  Tobacco Use  . Smoking status: Current Every Day Smoker    Types: Cigarettes  . Smokeless tobacco: Never Used  Substance and Sexual Activity  . Alcohol use: Not Currently  . Drug use: Never  . Sexual activity: Not Currently  Other Topics Concern  . Not on file  Social History Narrative   Retired Engineer, structural   Son- lives in [place]   Daughter lives locally   5 grandchildren and 1 great grandson   Enjoys spending time with her daughter and relaxing.    2 dogs   Single   Completed HS   Social Determinants of Health   Financial Resource Strain: Not on file  Food Insecurity: Not on file  Transportation Needs: Not on file  Physical Activity: Not on file  Stress: Not on file  Social Connections: Not on file  Intimate Partner Violence: Not on file    Past Surgical History:  Procedure Laterality Date  .  ABDOMINAL HYSTERECTOMY    . ELBOW SURGERY Right 2011   tennis elbow  . FOOT SURGERY Right    big toe- pinning due to fracture  . HAND SURGERY Left    thumb surgery    Family History  Problem Relation Age of Onset  . Lung cancer Mother   . Lung cancer Father   . Obesity Sister   . Hypertension Sister        [name]  . Lung cancer Paternal Grandfather     Allergies  Allergen Reactions  . Sulfa Antibiotics Hives    Current Outpatient Medications on File Prior to Visit  Medication Sig Dispense Refill  . aspirin 81 MG chewable tablet Chew by mouth.    Marland Kitchen  atorvastatin (LIPITOR) 80 MG tablet Take by mouth.    . clopidogrel (PLAVIX) 75 MG tablet Take by mouth.    . hydrochlorothiazide (HYDRODIURIL) 25 MG tablet Take 25 mg by mouth daily.    Marland Kitchen lisinopril (ZESTRIL) 20 MG tablet Take by mouth.    . pantoprazole (PROTONIX) 40 MG tablet Take by mouth.    . vitamin B-12 (CYANOCOBALAMIN) 500 MCG tablet Take by mouth.     No current facility-administered medications on file prior to visit.    There were no vitals taken for this visit.     Observations/Objective:   Gen: Awake, alert, no acute distress Resp: Breathing is even and non-labored Psych: calm/pleasant demeanor Neuro: Alert and Oriented x 3, + facial symmetry, speech is clear.   Assessment and Plan:   HTN- lisinopril was increased from 10mg  to 20mg  while hospitalized and her hctz was increased from 12.5 to 25mg  once daily. She needs to return for follow up bmet.  Will ask staff to schedule a lab visit. BP last night was 148/88.  Has not checked blood pressure today. Continue lisinopril 20mg  and hctz 25mg . BP acceptable per pt report.    BP Readings from Last 3 Encounters:  03/04/20 (!) 144/69  01/29/20 (!) 155/93  12/12/17 (!) 191/110   Tobacco abuse- now down <1/2 PPD following her CVA. She is working on completely quitting.    CVA-  Neuro recommendations as follows:  Continue dual antiplatelet therapy with aspirin + plavix x 90 days (08/14/20) and then de-escalate to aspirin 325 mg monotherapy given intracranial stenosis. Reviewed these recommendations with the patient.    Hyperlipidemia- LDL was 105 most recently.  Goal <70. Previously on mevacor 40mg .  This was changed to atorvastatin 80mg .  Plan to recheck lipids in 3 months.   B12 deficiency- she was placed on oral b12 supplementation 56mcg PO daily. Goal b12 level is >400.  Tobacco abuse- discussed importance of complete tobacco cessation for secondary stroke prevention.  Follow Up Instructions:    I discussed the  assessment and treatment plan with the patient. The patient was provided an opportunity to ask questions and all were answered. The patient agreed with the plan and demonstrated an understanding of the instructions.   The patient was advised to call back or seek an in-person evaluation if the symptoms worsen or if the condition fails to improve as anticipated.  Nance Pear, NP

## 2020-06-14 ENCOUNTER — Telehealth: Payer: Self-pay | Admitting: Family

## 2020-06-14 DIAGNOSIS — Z87891 Personal history of nicotine dependence: Secondary | ICD-10-CM | POA: Insufficient documentation

## 2020-06-14 DIAGNOSIS — E785 Hyperlipidemia, unspecified: Secondary | ICD-10-CM | POA: Insufficient documentation

## 2020-06-14 DIAGNOSIS — I10 Essential (primary) hypertension: Secondary | ICD-10-CM | POA: Insufficient documentation

## 2020-06-14 DIAGNOSIS — E538 Deficiency of other specified B group vitamins: Secondary | ICD-10-CM | POA: Insufficient documentation

## 2020-06-14 DIAGNOSIS — I63412 Cerebral infarction due to embolism of left middle cerebral artery: Secondary | ICD-10-CM | POA: Insufficient documentation

## 2020-06-14 DIAGNOSIS — Z720 Tobacco use: Secondary | ICD-10-CM | POA: Insufficient documentation

## 2020-06-14 NOTE — Telephone Encounter (Signed)
Please contact pt and schedule her for a lab appointment.

## 2020-06-14 NOTE — Telephone Encounter (Signed)
Please disregard. I see that this is already scheduled.

## 2020-06-17 ENCOUNTER — Other Ambulatory Visit: Payer: Self-pay

## 2020-06-17 ENCOUNTER — Other Ambulatory Visit (INDEPENDENT_AMBULATORY_CARE_PROVIDER_SITE_OTHER): Payer: Medicaid Other

## 2020-06-17 DIAGNOSIS — I10 Essential (primary) hypertension: Secondary | ICD-10-CM

## 2020-06-17 LAB — BASIC METABOLIC PANEL
BUN: 6 mg/dL (ref 6–23)
CO2: 29 mEq/L (ref 19–32)
Calcium: 9.6 mg/dL (ref 8.4–10.5)
Chloride: 97 mEq/L (ref 96–112)
Creatinine, Ser: 0.7 mg/dL (ref 0.40–1.20)
GFR: 88.16 mL/min (ref >60.00)
Glucose, Bld: 108 mg/dL — ABNORMAL HIGH (ref 70–99)
Potassium: 4.1 mEq/L (ref 3.5–5.1)
Sodium: 134 mEq/L — ABNORMAL LOW (ref 135–145)

## 2020-06-21 ENCOUNTER — Telehealth: Payer: Self-pay | Admitting: Family

## 2020-06-21 ENCOUNTER — Encounter: Payer: Self-pay | Admitting: Family

## 2020-06-21 DIAGNOSIS — I471 Supraventricular tachycardia: Secondary | ICD-10-CM

## 2020-06-21 NOTE — Telephone Encounter (Signed)
Please contact pt and let her know that I have completed her FMLA for her daughter. Also, I received the results from her heart monitor.  Her rhythm was normal, but she did have some episodes of increased heart rate.  I would like to set her up to meet with cardiology.

## 2020-06-23 NOTE — Telephone Encounter (Signed)
Pt was notified via Mychart when she sent a message inquiring about the call for a cardiology referral.

## 2020-07-01 ENCOUNTER — Encounter: Payer: Self-pay | Admitting: General Practice

## 2020-07-14 ENCOUNTER — Other Ambulatory Visit: Payer: Self-pay | Admitting: Family

## 2020-07-14 MED ORDER — LISINOPRIL 20 MG PO TABS
20.0000 mg | ORAL_TABLET | Freq: Every day | ORAL | 0 refills | Status: DC
Start: 2020-07-14 — End: 2020-08-17

## 2020-07-28 ENCOUNTER — Other Ambulatory Visit: Payer: Self-pay

## 2020-07-28 ENCOUNTER — Encounter: Payer: Self-pay | Admitting: Family

## 2020-07-28 ENCOUNTER — Other Ambulatory Visit: Payer: Self-pay | Admitting: Family

## 2020-07-28 MED ORDER — HYDROCHLOROTHIAZIDE 25 MG PO TABS: 25.0000 mg | ORAL_TABLET | Freq: Every day | ORAL | 1 refills | Status: DC

## 2020-07-28 NOTE — Telephone Encounter (Signed)
Patient advised per ov noted from 06-13-20, her HCTZ had been increased to 25 mg and provider wanted her to continue this dose. Patient reports she has been taking 12.5 because she did not have a rx for 25mg . New rx for HCTZ 25 mg sent to her pharmacy as indicated on last ov note. She will follow up in April

## 2020-08-16 ENCOUNTER — Encounter: Payer: Self-pay | Admitting: Family

## 2020-08-17 MED ORDER — HYDROCHLOROTHIAZIDE 25 MG PO TABS: 25.0000 mg | ORAL_TABLET | Freq: Every day | ORAL | 1 refills | Status: DC

## 2020-08-17 MED ORDER — LISINOPRIL 20 MG PO TABS: 20.0000 mg | ORAL_TABLET | Freq: Every day | ORAL | 1 refills | Status: DC

## 2020-08-17 MED ORDER — ATORVASTATIN CALCIUM 80 MG PO TABS: 80.0000 mg | ORAL_TABLET | Freq: Every day | ORAL | 1 refills | Status: DC

## 2020-08-17 MED ORDER — PANTOPRAZOLE SODIUM 40 MG PO TBEC: 40.0000 mg | DELAYED_RELEASE_TABLET | Freq: Every day | ORAL | 1 refills | Status: DC

## 2020-09-02 ENCOUNTER — Other Ambulatory Visit: Payer: Self-pay

## 2020-09-02 ENCOUNTER — Ambulatory Visit (INDEPENDENT_AMBULATORY_CARE_PROVIDER_SITE_OTHER): Payer: Medicaid Other | Admitting: Family

## 2020-09-02 ENCOUNTER — Encounter: Payer: Self-pay | Admitting: Family

## 2020-09-02 VITALS — BP 128/74 | HR 84 | Temp 99.0°F | Resp 16 | Ht 65.0 in | Wt 165.6 lb

## 2020-09-02 DIAGNOSIS — E785 Hyperlipidemia, unspecified: Secondary | ICD-10-CM | POA: Diagnosis not present

## 2020-09-02 DIAGNOSIS — Z720 Tobacco use: Secondary | ICD-10-CM

## 2020-09-02 DIAGNOSIS — E538 Deficiency of other specified B group vitamins: Secondary | ICD-10-CM | POA: Diagnosis not present

## 2020-09-02 DIAGNOSIS — Z0000 Encounter for general adult medical examination without abnormal findings: Secondary | ICD-10-CM

## 2020-09-02 DIAGNOSIS — I63412 Cerebral infarction due to embolism of left middle cerebral artery: Secondary | ICD-10-CM

## 2020-09-02 DIAGNOSIS — Z23 Encounter for immunization: Secondary | ICD-10-CM | POA: Diagnosis not present

## 2020-09-02 DIAGNOSIS — I10 Essential (primary) hypertension: Secondary | ICD-10-CM | POA: Diagnosis not present

## 2020-09-02 LAB — COMPREHENSIVE METABOLIC PANEL
ALT: 19 U/L (ref 0–35)
AST: 17 U/L (ref 0–37)
Albumin: 4.1 g/dL (ref 3.5–5.2)
Alkaline Phosphatase: 112 U/L (ref 39–117)
BUN: 7 mg/dL (ref 6–23)
CO2: 30 mEq/L (ref 19–32)
Calcium: 10 mg/dL (ref 8.4–10.5)
Chloride: 98 mEq/L (ref 96–112)
Creatinine, Ser: 0.7 mg/dL (ref 0.40–1.20)
GFR: 88.03 mL/min (ref >60.00)
Glucose, Bld: 88 mg/dL (ref 70–99)
Potassium: 4.3 mEq/L (ref 3.5–5.1)
Sodium: 138 mEq/L (ref 135–145)
Total Bilirubin: 0.8 mg/dL (ref 0.2–1.2)
Total Protein: 6.9 g/dL (ref 6.0–8.3)

## 2020-09-02 LAB — LIPID PANEL
Cholesterol: 147 mg/dL (ref 0–200)
HDL: 49.3 mg/dL (ref >39.00)
LDL Cholesterol: 76 mg/dL (ref 0–99)
NonHDL: 97.22
Total CHOL/HDL Ratio: 3
Triglycerides: 106 mg/dL (ref 0.0–149.0)
VLDL: 21.2 mg/dL (ref 0.0–40.0)

## 2020-09-02 MED ORDER — TETANUS-DIPHTHERIA TOXOIDS TD 2-2 LF/0.5ML IM SUSP: 0.5000 mL | Freq: Once | INTRAMUSCULAR | 0 refills | Status: AC

## 2020-09-02 MED ORDER — SHINGRIX 50 MCG/0.5ML IM SUSR: INTRAMUSCULAR | 1 refills | Status: DC

## 2020-09-02 NOTE — Assessment & Plan Note (Signed)
Tolerating atorvastatin 80mg. Continue same. Obtain follow up lipid panel.  

## 2020-09-02 NOTE — Assessment & Plan Note (Signed)
On oral b12 525mcg daily. Obtain follow up b12 level.

## 2020-09-02 NOTE — Assessment & Plan Note (Signed)
Reports only residual deficit is some difficultly writing neatly with her right hand. She completed plavix and is now on aspirin 325mg  once daily for secondary stroke prevention + statin.  Still needs to quit smoking- see below.

## 2020-09-02 NOTE — Patient Instructions (Signed)
Please get your tetanus shot from your pharmacy.  You should be contacted about your referral to GI.

## 2020-09-02 NOTE — Assessment & Plan Note (Signed)
She has cut down to < 1/2 PPD. We discussed starting 14 mcg patch to help her quit completely.

## 2020-09-02 NOTE — Progress Notes (Signed)
Subjective:   By signing my name below, I, [name], attest that this documentation has been prepared under the direction and in the presence of [name], NP. 09/02/2020    Patient ID: Dawn Lowery, female    DOB: 02/02/1951, 70 y.o.   MRN: [id number]  No chief complaint on file.   HPI Patient is in today for a office visit.   B12 deficiency- She reports that she is taking 500 mg vitamin B12 daily PO. She had her third Covid 19 booster shot on 08/23/2020. She is willing to get a Pneumonia vaccination.   Hx of CVA- reports that her only deficit is difficulty writing neatly with her right hand. She is on aspirin 325mg  once daily.  Preventative care-She is also interested in getting a mammogram screening appointment.    Hypertension- She is taking 25 mg Hydrochlorothiazide daily PO to help manage her hypertension.  Hyperlipidemia- She reported that she takes 80 mg Lipitor to manage her cholesterol levels since her last hospital visit.  GERD- She stopped taking her 40 mg Protonix daily PO because she has not experienced any GERD recently.  Past Medical History:  Diagnosis Date  . Hypertension     Past Surgical History:  Procedure Laterality Date  . ABDOMINAL HYSTERECTOMY    . ELBOW SURGERY Right 2011   tennis elbow  . FOOT SURGERY Right    big toe- pinning due to fracture  . HAND SURGERY Left    thumb surgery    Family History  Problem Relation Age of Onset  . Lung cancer Mother   . Lung cancer Father   . Obesity Sister   . Hypertension Sister        [name]  . Lung cancer Paternal Grandfather     Social History   Socioeconomic History  . Marital status: Single    Spouse name: Not on file  . Number of children: Not on file  . Years of education: Not on file  . Highest education level: Not on file  Occupational History  . Not on file  Tobacco Use  . Smoking status: Current Every Day Smoker    Types: Cigarettes  . Smokeless tobacco:  Never Used  Substance and Sexual Activity  . Alcohol use: Not Currently  . Drug use: Never  . Sexual activity: Not Currently  Other Topics Concern  . Not on file  Social History Narrative   Retired Engineer, structural   Son- lives in [place]   Daughter lives locally   5 grandchildren and 1 great grandson   Enjoys spending time with her daughter and relaxing.    2 dogs   Single   Completed HS   Social Determinants of Health   Financial Resource Strain: Not on file  Food Insecurity: Not on file  Transportation Needs: Not on file  Physical Activity: Not on file  Stress: Not on file  Social Connections: Not on file  Intimate Partner Violence: Not on file    Outpatient Medications Prior to Visit  Medication Sig Dispense Refill  . atorvastatin (LIPITOR) 80 MG tablet Take 1 tablet (80 mg total) by mouth daily. 90 tablet 1  . hydrochlorothiazide (HYDRODIURIL) 25 MG tablet Take 1 tablet (25 mg total) by mouth daily. 90 tablet 1  . lisinopril (ZESTRIL) 20 MG tablet Take 1 tablet (20 mg total) by mouth daily. 90 tablet 1  . pantoprazole (PROTONIX) 40 MG tablet Take 1 tablet (40 mg total) by mouth daily. 90 tablet  1   No facility-administered medications prior to visit.    Allergies  Allergen Reactions  . Sulfa Antibiotics Hives    ROS     Objective:    Physical Exam Constitutional:      Appearance: She is well-developed.  HENT:     Head: Normocephalic and atraumatic.  Eyes:     Comments: No nystagmus.  Neck:     Thyroid: No thyromegaly.  Cardiovascular:     Rate and Rhythm: Normal rate and regular rhythm.     Pulses: Normal pulses.     Heart sounds: Normal heart sounds. No murmur heard.   Pulmonary:     Effort: Pulmonary effort is normal. No respiratory distress.     Breath sounds: Normal breath sounds. No wheezing.  Musculoskeletal:        General: No swelling.     Cervical back: Neck supple.     Comments: 5/5 strength in both upper and lower extremities.    Skin:    General: Skin is warm and dry.  Neurological:     General: No focal deficit present.     Mental Status: She is alert and oriented to person, place, and time.     Cranial Nerves: No cranial nerve deficit.     Motor: No weakness.  Psychiatric:        Behavior: Behavior normal.        Thought Content: Thought content normal.        Judgment: Judgment normal.     There were no vitals taken for this visit. Wt Readings from Last 3 Encounters:  03/04/20 174 lb (78.9 kg)  01/29/20 177 lb 9.6 oz (80.6 kg)  12/12/17 166 lb (75.3 kg)    Diabetic Foot Exam - Simple   No data filed    Lab Results  Component Value Date   WBC 6.1 12/12/2017   HGB 15.6 (H) 12/12/2017   HCT 44.8 12/12/2017   PLT 219 12/12/2017   GLUCOSE 108 (H) 06/17/2020   CHOL 166 03/04/2020   TRIG 165 (H) 03/04/2020   HDL 45 (L) 03/04/2020   LDLCALC 95 03/04/2020   NA 134 (L) 06/17/2020   K 4.1 06/17/2020   CL 97 06/17/2020   CREATININE 0.70 06/17/2020   BUN 6 06/17/2020   CO2 29 06/17/2020    No results found for: TSH Lab Results  Component Value Date   WBC 6.1 12/12/2017   HGB 15.6 (H) 12/12/2017   HCT 44.8 12/12/2017   MCV 91.2 12/12/2017   PLT 219 12/12/2017   Lab Results  Component Value Date   NA 134 (L) 06/17/2020   K 4.1 06/17/2020   CO2 29 06/17/2020   GLUCOSE 108 (H) 06/17/2020   BUN 6 06/17/2020   CREATININE 0.70 06/17/2020   CALCIUM 9.6 06/17/2020   ANIONGAP 9 12/12/2017   GFR 88.16 06/17/2020   Lab Results  Component Value Date   CHOL 166 03/04/2020   Lab Results  Component Value Date   HDL 45 (L) 03/04/2020   Lab Results  Component Value Date   LDLCALC 95 03/04/2020   Lab Results  Component Value Date   TRIG 165 (H) 03/04/2020   Lab Results  Component Value Date   CHOLHDL 3.7 03/04/2020   No results found for: HGBA1C     Assessment & Plan:   Problem List Items Addressed This Visit   None      No orders of the defined types were placed in this  encounter.  I, Shehryar [place], personally preformed the services described in this documentation.  All medical record entries made by the scribe were at my direction and in my presence.  I have reviewed the chart and discharge instructions (if applicable) and agree that the record reflects my personal performance and is accurate and complete. 09/02/2020   I,[name],acting as a scribe for Nance Pear, NP.,have documented all relevant documentation on the behalf of Nance Pear, NP,as directed by  Nance Pear, NP while in the presence of Nance Pear, NP.    Shehryar Granger, NP, have reviewed all documentation for this visit. The documentation on 09/02/20 for the exam, diagnosis, procedures, and orders are all accurate and complete.

## 2020-09-06 LAB — METHYLMALONIC ACID, SERUM: Methylmalonic Acid, Quant: 111 nmol/L (ref 87–318)

## 2020-09-26 ENCOUNTER — Encounter: Payer: Self-pay | Admitting: Family

## 2020-09-30 ENCOUNTER — Ambulatory Visit (INDEPENDENT_AMBULATORY_CARE_PROVIDER_SITE_OTHER): Payer: Medicaid Other | Admitting: Family

## 2020-09-30 ENCOUNTER — Other Ambulatory Visit: Payer: Self-pay

## 2020-09-30 ENCOUNTER — Encounter: Payer: Self-pay | Admitting: Family

## 2020-09-30 VITALS — BP 132/65 | HR 77 | Temp 98.4°F | Resp 16 | Wt 165.0 lb

## 2020-09-30 DIAGNOSIS — D229 Melanocytic nevi, unspecified: Secondary | ICD-10-CM

## 2020-09-30 DIAGNOSIS — K219 Gastro-esophageal reflux disease without esophagitis: Secondary | ICD-10-CM

## 2020-09-30 DIAGNOSIS — C44529 Squamous cell carcinoma of skin of other part of trunk: Secondary | ICD-10-CM

## 2020-09-30 MED ORDER — PANTOPRAZOLE SODIUM 40 MG PO TBEC: 40.0000 mg | DELAYED_RELEASE_TABLET | Freq: Every day | ORAL | Status: DC

## 2020-09-30 NOTE — Assessment & Plan Note (Signed)
Procedure including risks/benefits explained to patient.  Questions were answered. After informed consent was obtained and a time out completed, the site was cleansed with betadine and then alcohol. 1% Lidocaine with epinephrine was injected under lesion and then shave biopsy was performed. Area was cauterized to obtain hemostasis.  Pt tolerated procedure well.  Specimen sent for pathology review.  Pt instructed to keep the area dry for 24 hours and to contact us if she develops redness, drainage or swelling at the site.  Pt may use tylenol as needed for discomfort today.

## 2020-09-30 NOTE — Progress Notes (Signed)
Subjective:   By signing my name below, I, [name], attest that this documentation has been prepared under the direction and in the presence of [name] NP. 09/30/2020      Patient ID: Dawn Lowery, female    DOB: 31-Mar-1951, 70 y.o.   MRN: [id number]  No chief complaint on file.   HPI Patient is in today for a visit to remove a mole on her right buttock. She complains that it itches and gives her issues in her daily life. It often scabs and when she pulls up her underwear the scab pulls off.  She resumed her 40 mg Protonix because her gerd symptoms recurred.      Past Medical History:  Diagnosis Date  . Hypertension     Past Surgical History:  Procedure Laterality Date  . ABDOMINAL HYSTERECTOMY    . ELBOW SURGERY Right 2011   tennis elbow  . FOOT SURGERY Right    big toe- pinning due to fracture  . HAND SURGERY Left    thumb surgery    Family History  Problem Relation Age of Onset  . Lung cancer Mother   . Lung cancer Father   . Obesity Sister   . Hypertension Sister        [name]  . Lung cancer Paternal Grandfather     Social History   Socioeconomic History  . Marital status: Single    Spouse name: Not on file  . Number of children: Not on file  . Years of education: Not on file  . Highest education level: Not on file  Occupational History  . Not on file  Tobacco Use  . Smoking status: Current Every Day Smoker    Types: Cigarettes  . Smokeless tobacco: Never Used  Substance and Sexual Activity  . Alcohol use: Not Currently  . Drug use: Never  . Sexual activity: Not Currently  Other Topics Concern  . Not on file  Social History Narrative   Retired Engineer, structural   Son- lives in [place]   Daughter lives locally   5 grandchildren and 1 great grandson   Enjoys spending time with her daughter and relaxing.    2 dogs   Single   Completed HS   Social Determinants of Health   Financial Resource Strain: Not on file  Food  Insecurity: Not on file  Transportation Needs: Not on file  Physical Activity: Not on file  Stress: Not on file  Social Connections: Not on file  Intimate Partner Violence: Not on file    Outpatient Medications Prior to Visit  Medication Sig Dispense Refill  . aspirin 325 MG tablet Take by mouth daily.    Marland Kitchen atorvastatin (LIPITOR) 80 MG tablet Take 1 tablet (80 mg total) by mouth daily. 90 tablet 1  . hydrochlorothiazide (HYDRODIURIL) 25 MG tablet Take 1 tablet (25 mg total) by mouth daily. 90 tablet 1  . lisinopril (ZESTRIL) 20 MG tablet Take 1 tablet (20 mg total) by mouth daily. 90 tablet 1  . vitamin B-12 (CYANOCOBALAMIN) 500 MCG tablet Take 500 mcg by mouth daily.    Marland Kitchen Zoster Vaccine Adjuvanted [facility]) injection Inject 0.5mg  IM now and again in 2-6 months. 0.5 mL 1   No facility-administered medications prior to visit.    Allergies  Allergen Reactions  . Sulfa Antibiotics Hives    Review of Systems  Gastrointestinal: Positive for abdominal pain.  Skin:       (+)Mole on right buttock  Objective:    Physical Exam Constitutional:      Appearance: Normal appearance.  HENT:     Head: Normocephalic and atraumatic.  Eyes:     Extraocular Movements: Extraocular movements intact.     Pupils: Pupils are equal, round, and reactive to light.  Skin:    General: Skin is warm and dry.     Comments: Approximately 1 cm wide raised lesion on right buttock  Neurological:     Mental Status: She is alert and oriented to person, place, and time.  Psychiatric:        Behavior: Behavior normal.     There were no vitals taken for this visit. Wt Readings from Last 3 Encounters:  09/02/20 165 lb 9.6 oz (75.1 kg)  03/04/20 174 lb (78.9 kg)  01/29/20 177 lb 9.6 oz (80.6 kg)    Diabetic Foot Exam - Simple   No data filed    Lab Results  Component Value Date   WBC 6.1 12/12/2017   HGB 15.6 (H) 12/12/2017   HCT 44.8 12/12/2017   PLT 219 12/12/2017   GLUCOSE 88  09/02/2020   CHOL 147 09/02/2020   TRIG 106.0 09/02/2020   HDL 49.30 09/02/2020   LDLCALC 76 09/02/2020   ALT 19 09/02/2020   AST 17 09/02/2020   NA 138 09/02/2020   K 4.3 09/02/2020   CL 98 09/02/2020   CREATININE 0.70 09/02/2020   BUN 7 09/02/2020   CO2 30 09/02/2020    No results found for: TSH Lab Results  Component Value Date   WBC 6.1 12/12/2017   HGB 15.6 (H) 12/12/2017   HCT 44.8 12/12/2017   MCV 91.2 12/12/2017   PLT 219 12/12/2017   Lab Results  Component Value Date   NA 138 09/02/2020   K 4.3 09/02/2020   CO2 30 09/02/2020   GLUCOSE 88 09/02/2020   BUN 7 09/02/2020   CREATININE 0.70 09/02/2020   BILITOT 0.8 09/02/2020   ALKPHOS 112 09/02/2020   AST 17 09/02/2020   ALT 19 09/02/2020   PROT 6.9 09/02/2020   ALBUMIN 4.1 09/02/2020   CALCIUM 10.0 09/02/2020   ANIONGAP 9 12/12/2017   GFR 88.03 09/02/2020   Lab Results  Component Value Date   CHOL 147 09/02/2020   Lab Results  Component Value Date   HDL 49.30 09/02/2020   Lab Results  Component Value Date   LDLCALC 76 09/02/2020   Lab Results  Component Value Date   TRIG 106.0 09/02/2020   Lab Results  Component Value Date   CHOLHDL 3 09/02/2020   No results found for: HGBA1C     Assessment & Plan:   Problem List Items Addressed This Visit   None      No orders of the defined types were placed in this encounter.   I, [name] NP, personally preformed the services described in this documentation.  All medical record entries made by the scribe were at my direction and in my presence.  I have reviewed the chart and discharge instructions (if applicable) and agree that the record reflects my personal performance and is accurate and complete. 09/30/2020   I,[name],acting as a Education administrator for Nance Pear, NP.,have documented all relevant documentation on the behalf of Nance Pear, NP,as directed by  Nance Pear, NP while in the presence of Nance Pear, NP.   Shehryar [employer]

## 2020-09-30 NOTE — Assessment & Plan Note (Signed)
Patient did not tolerate coming off of protonix 40mg  once daily and has restarted.

## 2020-09-30 NOTE — Patient Instructions (Signed)
Excision of Skin Lesions, Care After  This sheet gives you information about how to care for yourself after your procedure. Your health care provider may also give you more specific instructions. If you have problems or questions, contact your health care provider.  What can I expect after the procedure?  After your procedure, it is common to have pain or discomfort at the excision site.  Follow these instructions at home:  Excision care       · Follow instructions from your health care provider about how to take care of your excision site. Make sure you:  ? Wash your hands with soap and water before and after you change your bandage (dressing). If soap and water are not available, use hand sanitizer.  ? Change your dressing as told by your health care provider.  ? Leave stitches (sutures), skin glue, or adhesive strips in place. These skin closures may need to stay in place for 2 weeks or longer. If adhesive strip edges start to loosen and curl up, you may trim the loose edges. Do not remove adhesive strips completely unless your health care provider tells you to do that.  · Check the excision area every day for signs of infection. Watch for:  ? Redness, swelling, or pain.  ? Fluid or blood.  ? Warmth.  ? Pus or a bad smell.  · Keep the site clean, dry, and protected for at least 48 hours.  · For bleeding, apply gentle but firm pressure to the area using a folded towel for 20 minutes.  · Avoid high-impact exercise and activities until the sutures are removed or the area heals.  General instructions  · Take over-the-counter and prescription medicines only as told by your health care provider.  · Follow instructions from your health care provider about how to minimize scarring. Scarring should lessen over time.  · Avoid sun exposure until the area has healed. Use sunscreen to protect the area from the sun after it has healed.  · Keep all follow-up visits as told by your health care provider. This is  important.  Contact a health care provider if:  · You have redness, swelling, or pain around your excision site.  · You have fluid or blood coming from your excision site.  · Your excision site feels warm to the touch.  · You have pus or a bad smell coming from your excision site.  · You have a fever.  · You have pain that does not improve in 2-3 days after your procedure.  · You notice skin irregularities or changes in how you feel (sensation).  Summary  · This sheet of instructions provides you with information about caring for yourself after your procedure. Contact your health care provider if you have any problems or questions.  · Take over-the-counter and prescription medicines only as told by your health care provider.  · Change your dressing as told by your health care provider.  · Contact a health care provider if you have redness, swelling, pain, or other signs of infection around your excision site.  · Keep all follow-up visits as told by your health care provider. This is important.  This information is not intended to replace advice given to you by your health care provider. Make sure you discuss any questions you have with your health care provider.  Document Revised: 11/06/2017 Document Reviewed: 11/06/2017  Elsevier Patient Education © 2021 Elsevier Inc.   

## 2020-09-30 NOTE — Addendum Note (Signed)
Addended by: Jiles Prows on: 09/30/2020 03:19 PM   Modules accepted: Orders

## 2020-10-04 ENCOUNTER — Inpatient Hospital Stay (HOSPITAL_BASED_OUTPATIENT_CLINIC_OR_DEPARTMENT_OTHER): Admission: RE | Admit: 2020-10-04 | Payer: Medicaid Other | Source: Ambulatory Visit

## 2020-10-05 ENCOUNTER — Encounter: Payer: Self-pay | Admitting: Family

## 2020-10-07 ENCOUNTER — Telehealth: Payer: Self-pay

## 2020-10-07 NOTE — Telephone Encounter (Signed)
Cytology called in wanted to know where the mole was removed from, I inform them right buttock.

## 2020-10-11 ENCOUNTER — Other Ambulatory Visit: Payer: Self-pay

## 2020-10-11 ENCOUNTER — Ambulatory Visit (HOSPITAL_BASED_OUTPATIENT_CLINIC_OR_DEPARTMENT_OTHER)
Admission: RE | Admit: 2020-10-11 | Discharge: 2020-10-11 | Disposition: A | Discharge status: Home / Self Care | Payer: Medicaid Other | Source: Ambulatory Visit | Attending: Family | Admitting: Family

## 2020-10-11 ENCOUNTER — Encounter (HOSPITAL_BASED_OUTPATIENT_CLINIC_OR_DEPARTMENT_OTHER): Payer: Self-pay

## 2020-10-11 DIAGNOSIS — Z1231 Encounter for screening mammogram for malignant neoplasm of breast: Secondary | ICD-10-CM | POA: Diagnosis present

## 2020-10-11 DIAGNOSIS — Z0000 Encounter for general adult medical examination without abnormal findings: Secondary | ICD-10-CM

## 2020-10-12 ENCOUNTER — Telehealth: Payer: Self-pay | Admitting: Family

## 2020-10-12 DIAGNOSIS — C4490 Unspecified malignant neoplasm of skin, unspecified: Secondary | ICD-10-CM | POA: Insufficient documentation

## 2020-10-12 DIAGNOSIS — D0471 Carcinoma in situ of skin of right lower limb, including hip: Secondary | ICD-10-CM

## 2020-10-12 NOTE — Telephone Encounter (Signed)
Reviewed skin biopsy results with patient- + squamous cell carcinoma. Discussed importance of following through with dermatology referral for wider/deeper incision.  Pt verbalizes understanding.

## 2020-10-22 ENCOUNTER — Telehealth: Payer: Medicaid Other | Admitting: Nurse Practitioner

## 2020-10-22 DIAGNOSIS — N3000 Acute cystitis without hematuria: Secondary | ICD-10-CM | POA: Diagnosis not present

## 2020-10-22 MED ORDER — CEPHALEXIN 500 MG PO CAPS: 500.0000 mg | ORAL_CAPSULE | Freq: Two times a day (BID) | ORAL | 0 refills | Status: DC

## 2020-10-22 NOTE — Progress Notes (Signed)

## 2020-10-25 ENCOUNTER — Encounter: Payer: Self-pay | Admitting: Family

## 2020-10-25 NOTE — Telephone Encounter (Signed)
Dawn Lowery, could you please call the radiology reading room and ask them the status of the mammogram reading on this patient?  [phone]  See if there is anything that they need from the patient (records release etc.) Thanks

## 2020-10-27 ENCOUNTER — Other Ambulatory Visit: Payer: Self-pay | Admitting: Family

## 2020-10-27 DIAGNOSIS — R928 Other abnormal and inconclusive findings on diagnostic imaging of breast: Secondary | ICD-10-CM

## 2020-11-07 ENCOUNTER — Ambulatory Visit
Admission: RE | Admit: 2020-11-07 | Discharge: 2020-11-07 | Disposition: A | Discharge status: Home / Self Care | Payer: Medicaid Other | Source: Ambulatory Visit | Attending: Family | Admitting: Family

## 2020-11-07 ENCOUNTER — Other Ambulatory Visit: Payer: Self-pay

## 2020-11-07 DIAGNOSIS — R928 Other abnormal and inconclusive findings on diagnostic imaging of breast: Secondary | ICD-10-CM

## 2020-11-07 IMAGING — US US BREAST*L* LIMITED INC AXILLA
1 series · 4 of 4 positions shown · non-contrast
Comparison: Previous exams including recent screening mammogram
dated [DATE].

CLINICAL DATA: Patient returns today to evaluate a possible
elongated mass within the central LEFT breast identified on recent
screening mammogram.

EXAM:
DIGITAL DIAGNOSTIC UNILATERAL LEFT MAMMOGRAM WITH TOMOSYNTHESIS AND
CAD; ULTRASOUND LEFT BREAST LIMITED
TECHNIQUE: Left digital diagnostic mammography and breast tomosynthesis was
performed. The images were evaluated with computer-aided detection.;
Targeted ultrasound examination of the left breast was performed

[Series 1: us breast*left* limited inc axilla · 0.06mm/px · 4 of 4 slices shown]
[im 1/4]
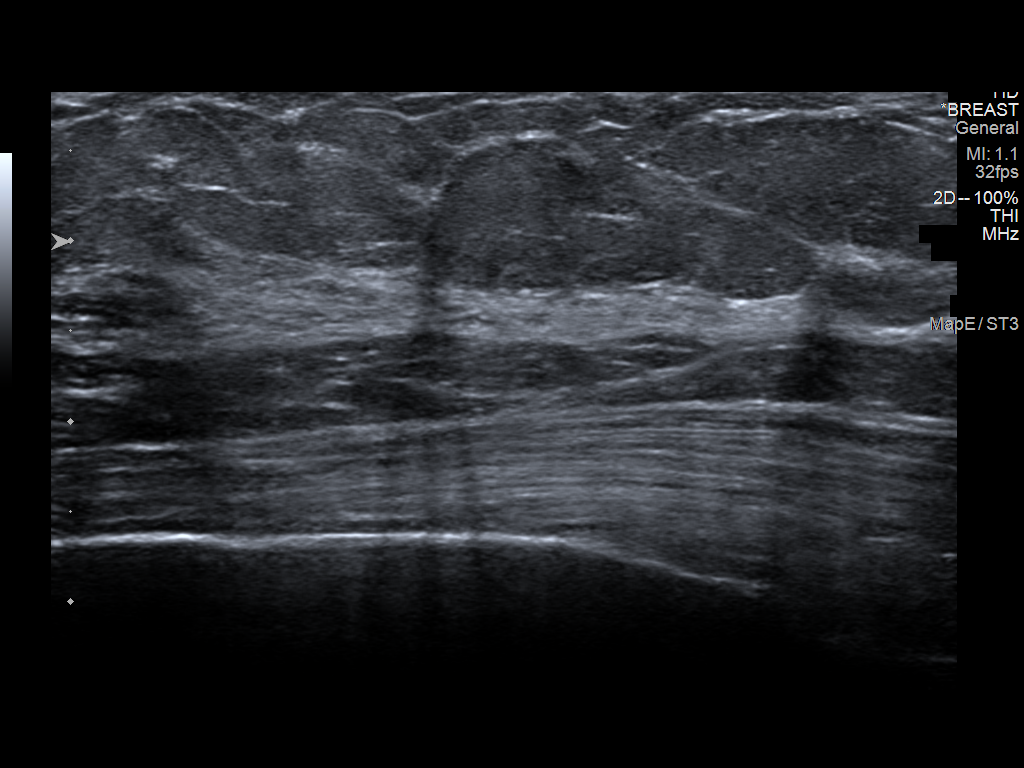
[im 2/4]
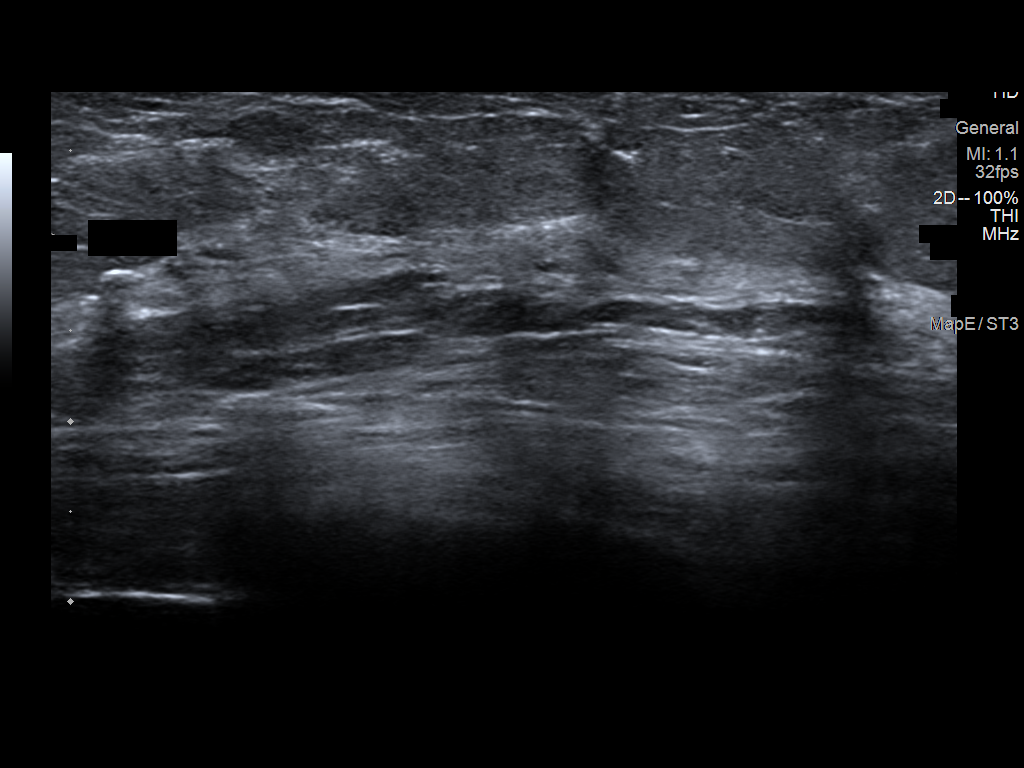
[im 3/4]
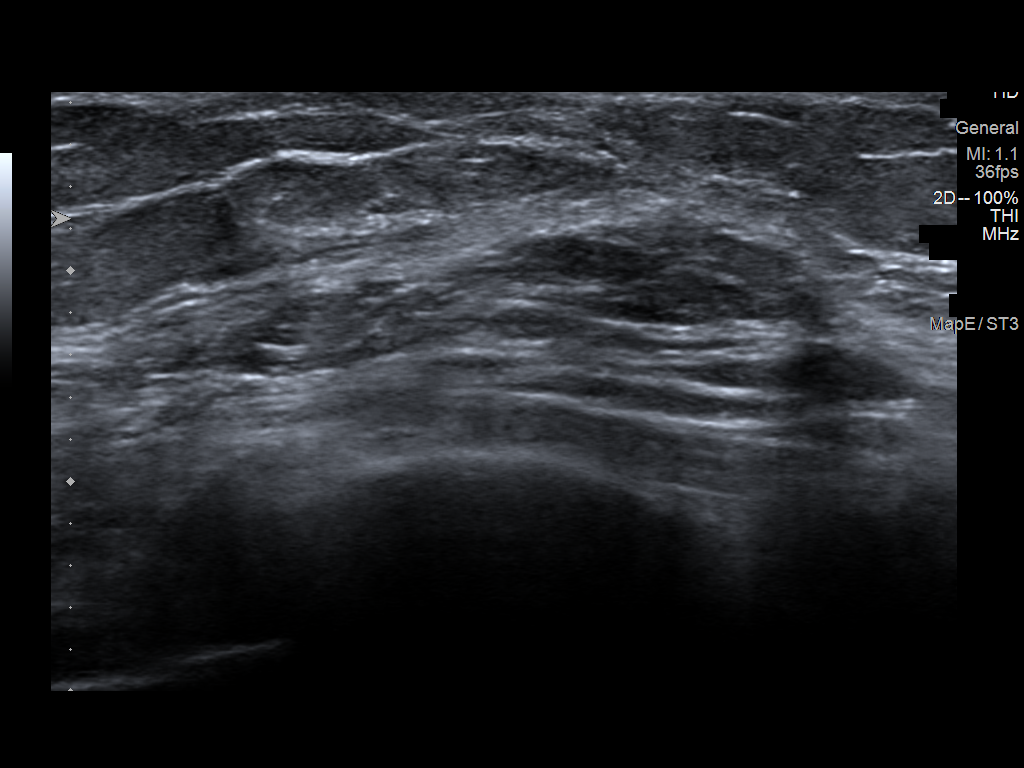
[im 4/4]
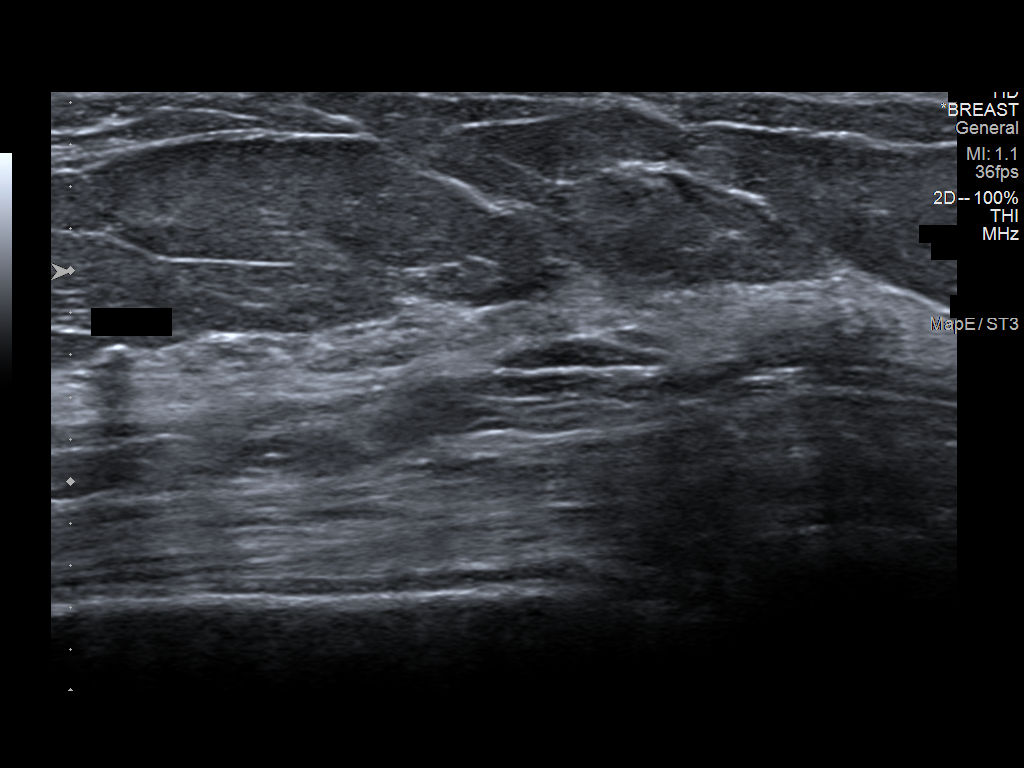

[4 of 4 positions shown; findings below may reference images not displayed]

ACR Breast Density Category b: There are scattered areas of
fibroglandular density.
FINDINGS: LEFT breast diagnostic mammogram: On today's additional diagnostic
views, including spot compression with 3D tomosynthesis, there is no
persistent abnormality within the outer central LEFT breast
suggesting superimposition of normal fibroglandular tissues.
Ultrasound will be performed to ensure benignity.

Targeted ultrasound is performed, evaluating the outer,
retroareolar, and all periareolar regions of the LEFT breast,
showing only normal fibroglandular tissues and fat lobules. No solid
or cystic mass is identified. No dilated ducts.
IMPRESSION: No evidence of malignancy.

Patient may return to routine annual bilateral screening mammogram
schedule.

RECOMMENDATION:
Screening mammogram in one year.(Code:[4M])

I have discussed the findings and recommendations with the patient.
If applicable, a reminder letter will be sent to the patient
regarding the next appointment.

BI-RADS CATEGORY  1: Negative.

## 2020-11-07 IMAGING — MG MM DIGITAL DIAGNOSTIC UNILAT*L* W/ TOMO W/ CAD
4 series · 4 of 12 positions shown · non-contrast
Comparison: Previous exams including recent screening mammogram
dated [DATE].

CLINICAL DATA: Patient returns today to evaluate a possible
elongated mass within the central LEFT breast identified on recent
screening mammogram.

EXAM:
DIGITAL DIAGNOSTIC UNILATERAL LEFT MAMMOGRAM WITH TOMOSYNTHESIS AND
CAD; ULTRASOUND LEFT BREAST LIMITED
TECHNIQUE: Left digital diagnostic mammography and breast tomosynthesis was
performed. The images were evaluated with computer-aided detection.;
Targeted ultrasound examination of the left breast was performed

[L ML synth-2D]
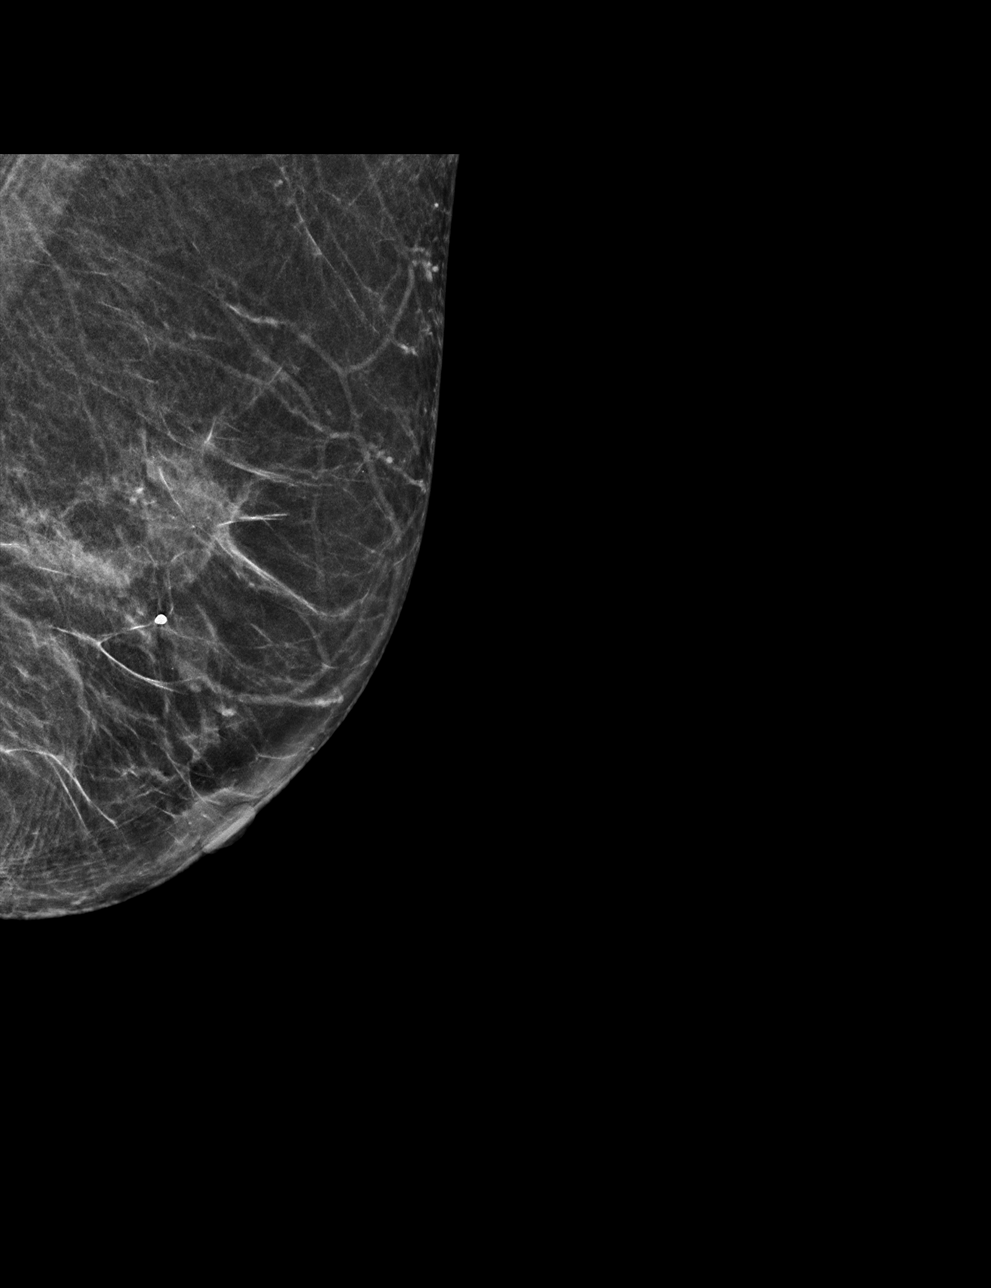

[L MLO synth-2D]
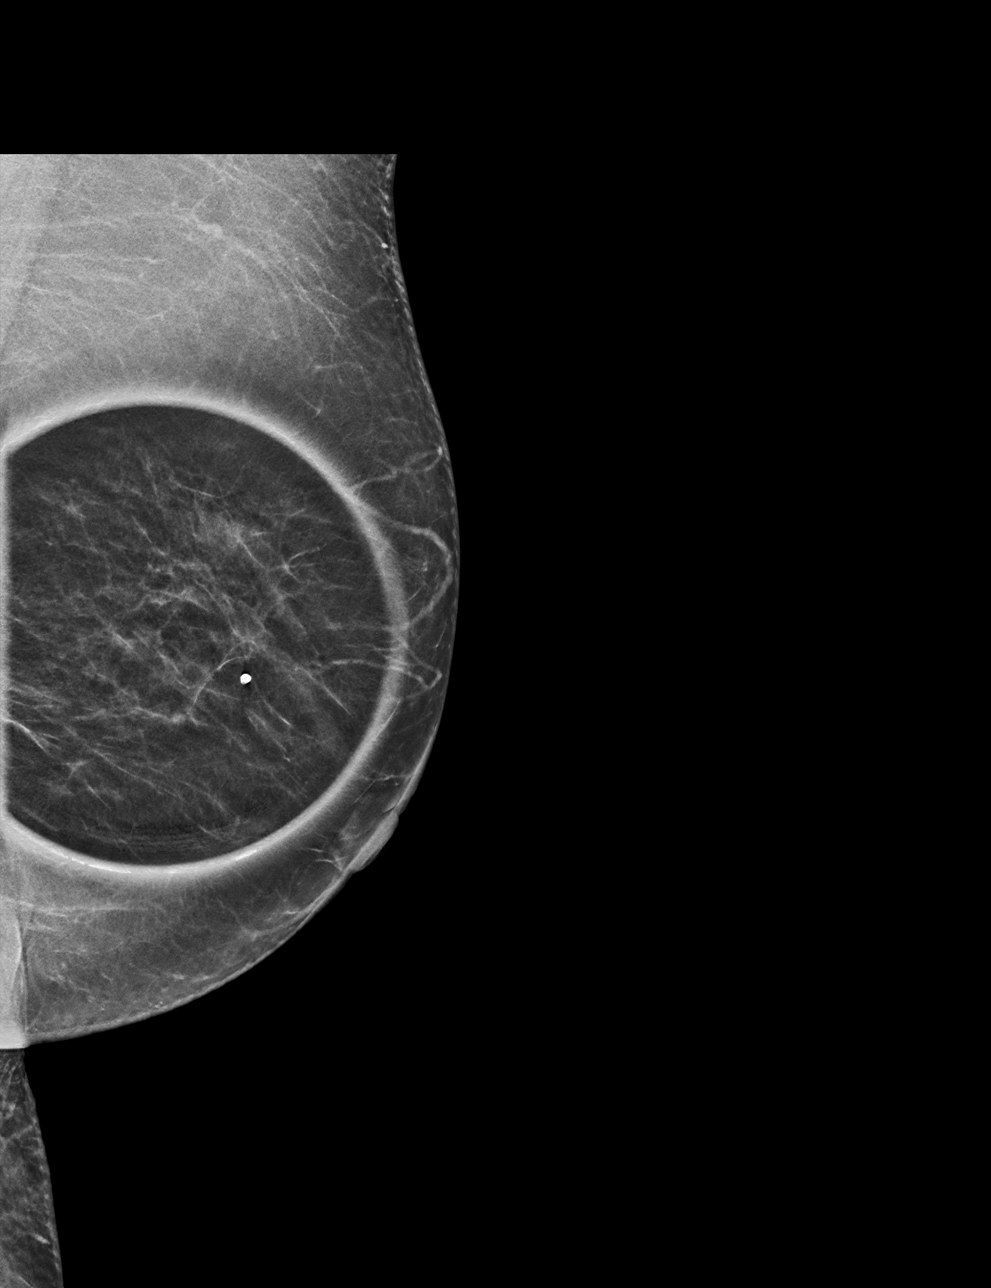

[L ML tomo · tomo slice 27/54.0]
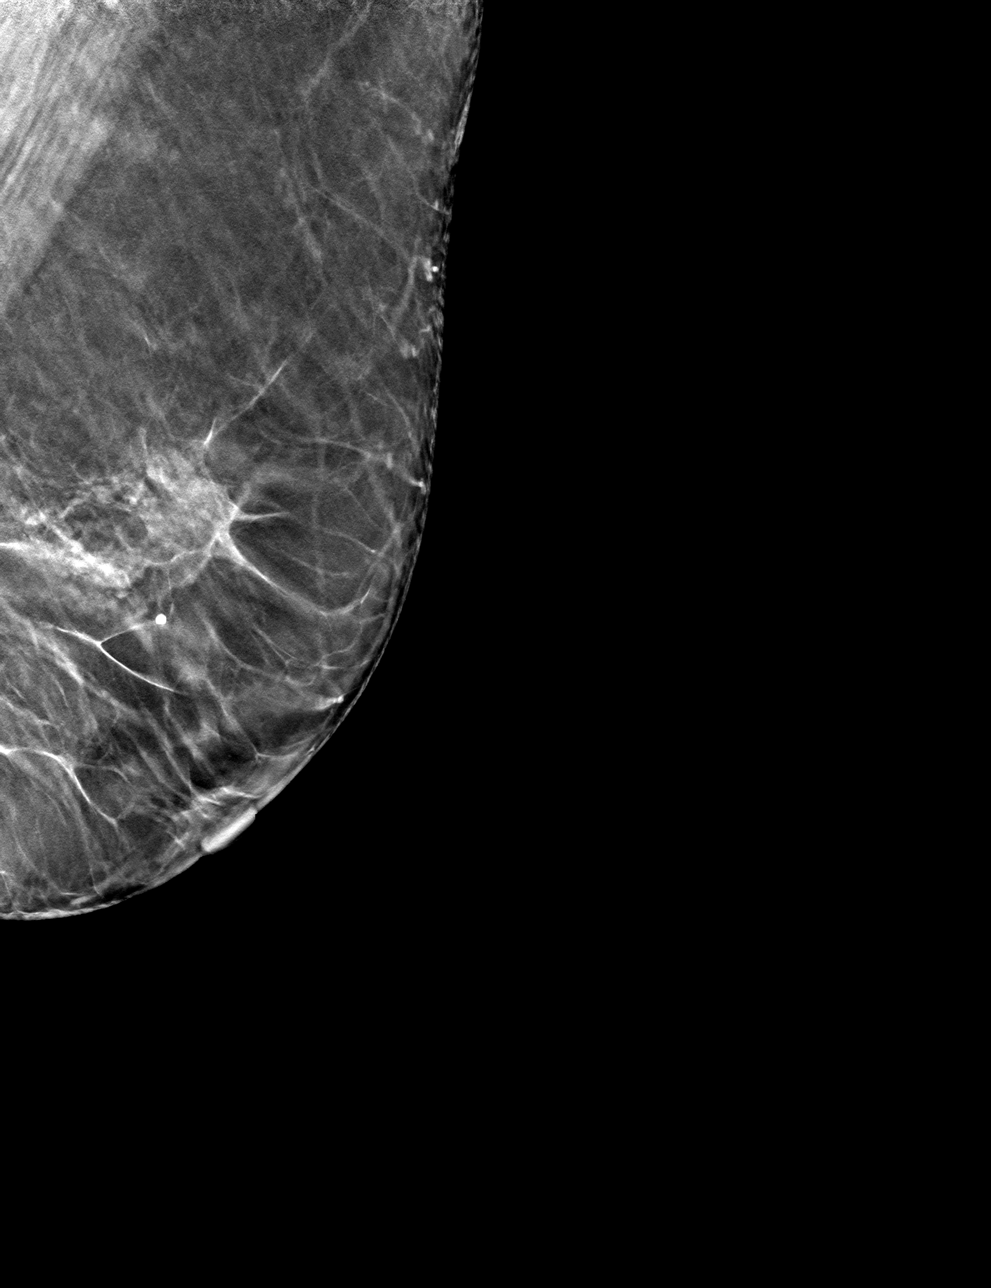

[L MLO tomo · tomo slice 21/40.0]
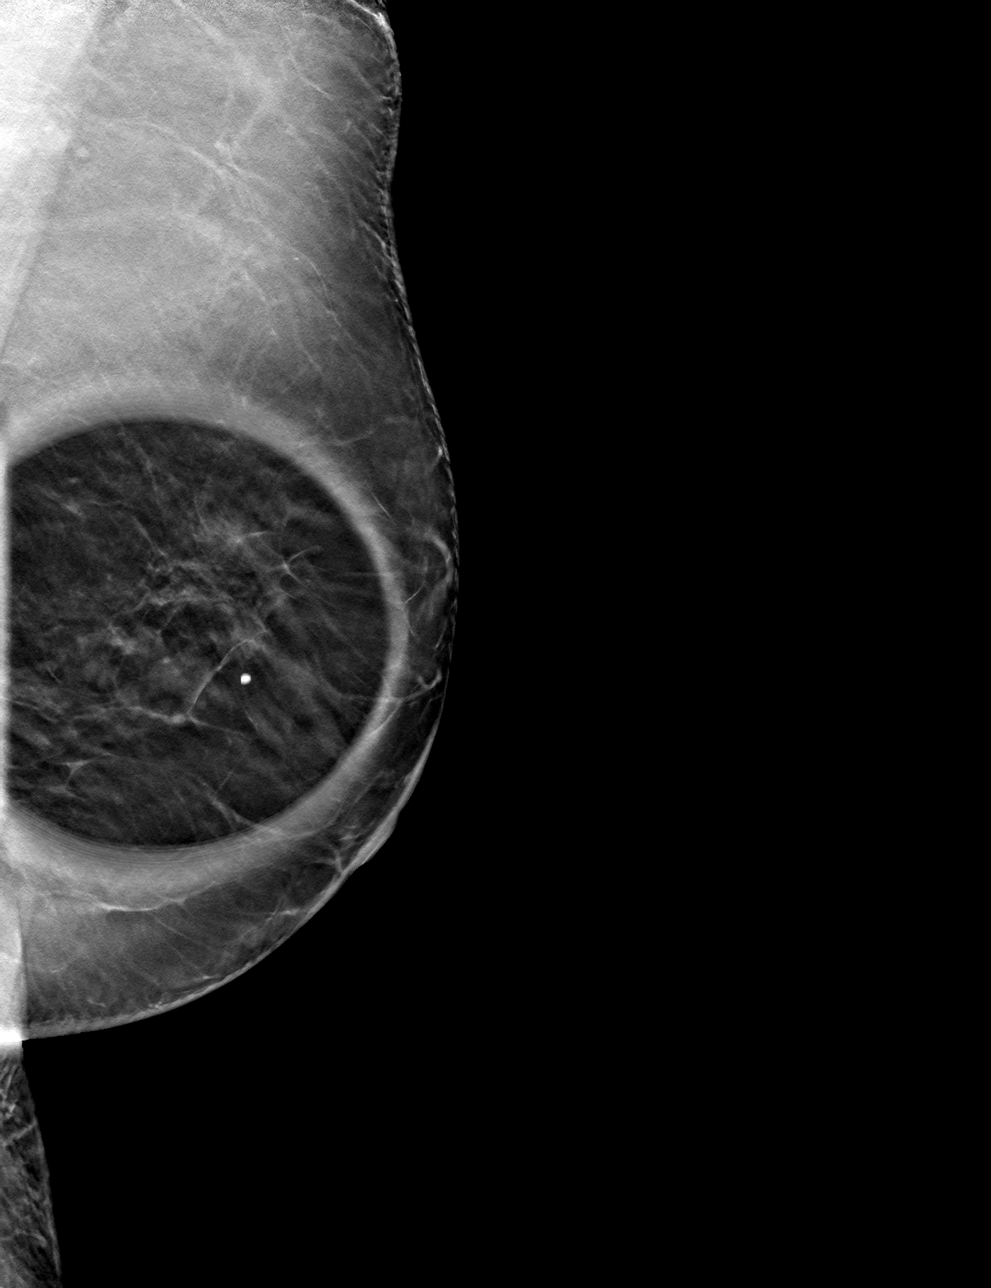

[4 of 12 positions shown; findings below may reference images not displayed]

ACR Breast Density Category b: There are scattered areas of
fibroglandular density.
FINDINGS: LEFT breast diagnostic mammogram: On today's additional diagnostic
views, including spot compression with 3D tomosynthesis, there is no
persistent abnormality within the outer central LEFT breast
suggesting superimposition of normal fibroglandular tissues.
Ultrasound will be performed to ensure benignity.

Targeted ultrasound is performed, evaluating the outer,
retroareolar, and all periareolar regions of the LEFT breast,
showing only normal fibroglandular tissues and fat lobules. No solid
or cystic mass is identified. No dilated ducts.
IMPRESSION: No evidence of malignancy.

Patient may return to routine annual bilateral screening mammogram
schedule.

RECOMMENDATION:
Screening mammogram in one year.(Code:[4M])

I have discussed the findings and recommendations with the patient.
If applicable, a reminder letter will be sent to the patient
regarding the next appointment.

BI-RADS CATEGORY  1: Negative.

## 2020-12-08 ENCOUNTER — Telehealth: Payer: Self-pay | Admitting: Family

## 2020-12-08 NOTE — Telephone Encounter (Signed)
Pt had [place] document to pick up at front desk (5 pages FMLA) Pt has not pick up document since 06-21-2020. Document mailed to pt.

## 2020-12-26 ENCOUNTER — Encounter: Payer: Self-pay | Admitting: Family

## 2020-12-27 ENCOUNTER — Other Ambulatory Visit: Payer: Self-pay | Admitting: Family

## 2021-02-15 ENCOUNTER — Other Ambulatory Visit: Payer: Self-pay | Admitting: Family

## 2021-03-06 ENCOUNTER — Ambulatory Visit (INDEPENDENT_AMBULATORY_CARE_PROVIDER_SITE_OTHER): Payer: Medicaid Other | Admitting: Family

## 2021-03-06 ENCOUNTER — Other Ambulatory Visit: Payer: Self-pay

## 2021-03-06 VITALS — BP 146/79 | HR 71 | Temp 98.1°F | Resp 16 | Ht 65.0 in | Wt 164.0 lb

## 2021-03-06 DIAGNOSIS — Z23 Encounter for immunization: Secondary | ICD-10-CM | POA: Diagnosis not present

## 2021-03-06 DIAGNOSIS — E785 Hyperlipidemia, unspecified: Secondary | ICD-10-CM | POA: Diagnosis not present

## 2021-03-06 DIAGNOSIS — I63412 Cerebral infarction due to embolism of left middle cerebral artery: Secondary | ICD-10-CM

## 2021-03-06 DIAGNOSIS — E538 Deficiency of other specified B group vitamins: Secondary | ICD-10-CM

## 2021-03-06 DIAGNOSIS — I10 Essential (primary) hypertension: Secondary | ICD-10-CM

## 2021-03-06 LAB — BASIC METABOLIC PANEL
BUN: 7 mg/dL (ref 6–23)
CO2: 29 mEq/L (ref 19–32)
Calcium: 9.4 mg/dL (ref 8.4–10.5)
Chloride: 96 mEq/L (ref 96–112)
Creatinine, Ser: 0.65 mg/dL (ref 0.40–1.20)
GFR: 89.3 mL/min (ref >60.00)
Glucose, Bld: 68 mg/dL — ABNORMAL LOW (ref 70–99)
Potassium: 3.9 mEq/L (ref 3.5–5.1)
Sodium: 134 mEq/L — ABNORMAL LOW (ref 135–145)

## 2021-03-06 LAB — VITAMIN B12: Vitamin B-12: 1111 pg/mL — ABNORMAL HIGH (ref 211–911)

## 2021-03-06 MED ORDER — HYDROCHLOROTHIAZIDE 25 MG PO TABS: 25.0000 mg | ORAL_TABLET | Freq: Every day | ORAL | 1 refills | Status: DC

## 2021-03-06 MED ORDER — VITAMIN B-12 1000 MCG PO TABS: 1000.0000 ug | ORAL_TABLET | Freq: Every day | ORAL | Status: AC

## 2021-03-06 MED ORDER — LISINOPRIL 20 MG PO TABS: 20.0000 mg | ORAL_TABLET | Freq: Every day | ORAL | 1 refills | Status: DC

## 2021-03-06 MED ORDER — ATORVASTATIN CALCIUM 80 MG PO TABS: 80.0000 mg | ORAL_TABLET | Freq: Every day | ORAL | 1 refills | Status: DC

## 2021-03-06 NOTE — Addendum Note (Signed)
Addended by: [name] on: 03/06/2021 10:20 AM   Modules accepted: Orders

## 2021-03-06 NOTE — Assessment & Plan Note (Addendum)
Lab Results  Component Value Date   CHOL 147 09/02/2020   HDL 49.30 09/02/2020   LDLCALC 76 09/02/2020   TRIG 106.0 09/02/2020   CHOLHDL 3 09/02/2020   Lipids stable. Continue atorvastatin 80mg .

## 2021-03-06 NOTE — Assessment & Plan Note (Signed)
Recheck level. Was low on oral b12 500 mcg outside our system. Recommended that she increase to 1000 mcg once daily.

## 2021-03-06 NOTE — Assessment & Plan Note (Addendum)
BP Readings from Last 3 Encounters:  03/06/21 (!) 146/79  09/30/20 132/65  09/02/20 128/74   Fair BP today. Continue current dose of lisinopril.

## 2021-03-06 NOTE — Assessment & Plan Note (Signed)
Continue bp control, statin and aspirin 325mg  once daily.

## 2021-03-06 NOTE — Progress Notes (Signed)
Subjective:   By signing my name below, I, [name], attest that this documentation has been prepared under the direction and in the presence of [name] NP. 03/06/2021    Patient ID: Dawn Lowery, female    DOB: 22-Jul-1950, 70 y.o.   MRN: [id number]  Chief Complaint  Patient presents with   Hypertension    Here for follow up   Hyperlipidemia    HPI Patient is in today for a office visit.   Blood pressure- Her blood pressure is slightly elevated. She continues taking 25 mg hydrochlorothiazide daily PO, 20 mg lisinopril daily PO and reports no new issues while taking it. She is requesting a refill on them as well.   BP Readings from Last 3 Encounters:  03/06/21 (!) 146/79  09/30/20 132/65  09/02/20 128/74   Pulse Readings from Last 3 Encounters:  03/06/21 71  09/30/20 77  09/02/20 84   Cholesterol- She continues taking 80 mg atorvastatin daily PO and reports no new issues while taking it. She is requesting a refill on it as well. Her last cholesterol levels looked good.  Lab Results  Component Value Date   CHOL 147 09/02/2020   HDL 49.30 09/02/2020   LDLCALC 76 09/02/2020   TRIG 106.0 09/02/2020   CHOLHDL 3 09/02/2020   B12- She continues taking vitamin 500 mg B12 supplements daily. Her last lab work showed she had low vitamin B12. Immunizations- She is receiving a flu vaccine during this visit. She reports receiving the new bivalent Covid-19 vaccine on 02/20/2021.    Health Maintenance Due  Topic Date Due   TETANUS/TDAP  Never done   Zoster Vaccines- Shingrix (1 of 2) Never done   COLONOSCOPY (Pts 45-8yrs Insurance coverage will need to be confirmed)  Never done    Past Medical History:  Diagnosis Date   Hypertension     Past Surgical History:  Procedure Laterality Date   ABDOMINAL HYSTERECTOMY     ELBOW SURGERY Right 2011   tennis elbow   FOOT SURGERY Right    big toe- pinning due to fracture   HAND SURGERY Left    thumb surgery     Family History  Problem Relation Age of Onset   Lung cancer Mother    Lung cancer Father    Obesity Sister    Hypertension Sister        [name]   Lung cancer Paternal Grandfather     Social History   Socioeconomic History   Marital status: Single    Spouse name: Not on file   Number of children: Not on file   Years of education: Not on file   Highest education level: Not on file  Occupational History   Not on file  Tobacco Use   Smoking status: Every Day    Types: Cigarettes   Smokeless tobacco: Never  Substance and Sexual Activity   Alcohol use: Not Currently   Drug use: Never   Sexual activity: Not Currently  Other Topics Concern   Not on file  Social History Narrative   Retired Engineer, structural   Son- lives in [place]   Daughter lives locally   5 grandchildren and 1 great grandson   Enjoys spending time with her daughter and relaxing.    2 dogs   Single   Completed HS   Social Determinants of Health   Financial Resource Strain: Not on file  Food Insecurity: Not on file  Transportation Needs: Not on file  Physical Activity: Not on file  Stress: Not on file  Social Connections: Not on file  Intimate Partner Violence: Not on file    Outpatient Medications Prior to Visit  Medication Sig Dispense Refill   aspirin 325 MG tablet Take by mouth daily.     hydrochlorothiazide (HYDRODIURIL) 25 MG tablet Take 1 tablet by mouth once daily 30 tablet 1   pantoprazole (PROTONIX) 40 MG tablet Take 1 tablet by mouth once daily 90 tablet 1   Zoster Vaccine Adjuvanted [facility]) injection Inject 0.5mg  IM now and again in 2-6 months. 0.5 mL 1   atorvastatin (LIPITOR) 80 MG tablet Take 1 tablet (80 mg total) by mouth daily. 90 tablet 1   cephALEXin (KEFLEX) 500 MG capsule Take 1 capsule (500 mg total) by mouth 2 (two) times daily. 14 capsule 0   lisinopril (ZESTRIL) 20 MG tablet Take 1 tablet (20 mg total) by mouth daily. 90 tablet 1   vitamin B-12  (CYANOCOBALAMIN) 500 MCG tablet Take 500 mcg by mouth daily.     No facility-administered medications prior to visit.    Allergies  Allergen Reactions   Sulfa Antibiotics Hives    ROS    See HPI Objective:    Physical Exam Constitutional:      General: She is not in acute distress.    Appearance: Normal appearance. She is not ill-appearing.  HENT:     Head: Normocephalic and atraumatic.     Right Ear: External ear normal.     Left Ear: External ear normal.  Eyes:     Extraocular Movements: Extraocular movements intact.     Pupils: Pupils are equal, round, and reactive to light.  Cardiovascular:     Rate and Rhythm: Normal rate and regular rhythm.     Heart sounds: Normal heart sounds. No murmur heard.   No gallop.  Pulmonary:     Effort: Pulmonary effort is normal. No respiratory distress.     Breath sounds: Normal breath sounds. No wheezing or rales.  Skin:    General: Skin is warm and dry.  Neurological:     Mental Status: She is alert and oriented to person, place, and time.  Psychiatric:        Behavior: Behavior normal.    BP (!) 146/79 (BP Location: Right Arm, Patient Position: Sitting, Cuff Size: Small)   Pulse 71   Temp 98.1 F (36.7 C) (Oral)   Resp 16   Ht 5\' 5"  (1.651 m)   Wt 164 lb (74.4 kg)   SpO2 100%   BMI 27.29 kg/m  Wt Readings from Last 3 Encounters:  03/06/21 164 lb (74.4 kg)  09/30/20 165 lb (74.8 kg)  09/02/20 165 lb 9.6 oz (75.1 kg)       Assessment & Plan:   Problem List Items Addressed This Visit       Unprioritized   Hyperlipidemia    Lab Results  Component Value Date   CHOL 147 09/02/2020   HDL 49.30 09/02/2020   LDLCALC 76 09/02/2020   TRIG 106.0 09/02/2020   CHOLHDL 3 09/02/2020  Lipids stable. Continue atorvastatin 80mg .        Relevant Medications   lisinopril (ZESTRIL) 20 MG tablet   atorvastatin (LIPITOR) 80 MG tablet   Essential hypertension    BP Readings from Last 3 Encounters:  03/06/21 (!) 146/79   09/30/20 132/65  09/02/20 128/74  Fair BP today. Continue current dose of lisinopril.       Relevant Medications  lisinopril (ZESTRIL) 20 MG tablet   atorvastatin (LIPITOR) 80 MG tablet   Other Relevant Orders   Basic metabolic panel   Cerebrovascular accident (CVA) due to embolism of left middle cerebral artery ([place])    Continue bp control, statin and aspirin 325mg  once daily.       Relevant Medications   lisinopril (ZESTRIL) 20 MG tablet   atorvastatin (LIPITOR) 80 MG tablet   B12 deficiency    Recheck level. Was low on oral b12 500 mcg outside our system. Recommended that she increase to 1000 mcg once daily.       Relevant Orders   B12   Other Visit Diagnoses     Needs flu shot    -  Primary   Relevant Orders   Flu Vaccine QUAD High Dose(Fluad) (Completed)        Meds ordered this encounter  Medications   vitamin B-12 (CYANOCOBALAMIN) 1000 MCG tablet    Sig: Take 1 tablet (1,000 mcg total) by mouth daily.    Order Specific Question:   Supervising Provider    Answer:   Penni Homans A [[id number]]   lisinopril (ZESTRIL) 20 MG tablet    Sig: Take 1 tablet (20 mg total) by mouth daily.    Dispense:  90 tablet    Refill:  1    Order Specific Question:   Supervising Provider    Answer:   Penni Homans A [[id number]]   atorvastatin (LIPITOR) 80 MG tablet    Sig: Take 1 tablet (80 mg total) by mouth daily.    Dispense:  90 tablet    Refill:  1    Order Specific Question:   Supervising Provider    Answer:   Penni Homans A [[id number]]    I, [name] NP, personally preformed the services described in this documentation.  All medical record entries made by the scribe were at my direction and in my presence.  I have reviewed the chart and discharge instructions (if applicable) and agree that the record reflects my personal performance and is accurate and complete. 03/06/2021   I,[name],acting as a Education administrator for Nance Pear, NP.,have documented all relevant  documentation on the behalf of Nance Pear, NP,as directed by  Nance Pear, NP while in the presence of Nance Pear, NP.   Nance Pear, NP

## 2021-03-09 ENCOUNTER — Ambulatory Visit (INDEPENDENT_AMBULATORY_CARE_PROVIDER_SITE_OTHER): Payer: Medicaid Other

## 2021-03-09 ENCOUNTER — Encounter: Payer: Self-pay | Admitting: Family

## 2021-03-09 VITALS — Ht 65.0 in | Wt 164.0 lb

## 2021-03-09 DIAGNOSIS — Z0000 Encounter for general adult medical examination without abnormal findings: Secondary | ICD-10-CM

## 2021-03-09 NOTE — Patient Instructions (Signed)
Dawn Lowery , Thank you for taking time to complete your Medicare Wellness Visit. I appreciate your ongoing commitment to your health goals. Please review the following plan we discussed and let me know if I can assist you in the future.   Screening recommendations/referrals: Colonoscopy: Per our conversation completed 6-7 years ago & exam was normal. Mammogram: Completed 10/11/2020-Due 10/11/2021 Bone Density: Declined today. Please call the office to schedule if you change your mind. Recommended yearly ophthalmology/optometry visit for glaucoma screening and checkup Recommended yearly dental visit for hygiene and checkup  Vaccinations: Influenza vaccine: Up to date Pneumococcal vaccine: Up to date Tdap vaccine: As we discussed today, please bring vaccination date to your next office visit. Shingles vaccine:  As we discussed today, please bring vaccination date to your next office visit.  Covid-19:Up to date  Advanced directives: Please bring a copy of Living Will and/or [place] for your chart once completed.   Conditions/risks identified: See problem list  Next appointment: Follow up in one year for your annual wellness visit 03/12/2022 @ 9:00   Preventive Care 65 Years and Older, Female Preventive care refers to lifestyle choices and visits with your health care provider that can promote health and wellness. What does preventive care include? A yearly physical exam. This is also called an annual well check. Dental exams once or twice a year. Routine eye exams. Ask your health care provider how often you should have your eyes checked. Personal lifestyle choices, including: Daily care of your teeth and gums. Regular physical activity. Eating a healthy diet. Avoiding tobacco and drug use. Limiting alcohol use. Practicing safe sex. Taking low-dose aspirin every day. Taking vitamin and mineral supplements as recommended by your health care provider. What happens  during an annual well check? The services and screenings done by your health care provider during your annual well check will depend on your age, overall health, lifestyle risk factors, and family history of disease. Counseling  Your health care provider may ask you questions about your: Alcohol use. Tobacco use. Drug use. Emotional well-being. Home and relationship well-being. Sexual activity. Eating habits. History of falls. Memory and ability to understand (cognition). Work and work Statistician. Reproductive health. Screening  You may have the following tests or measurements: Height, weight, and BMI. Blood pressure. Lipid and cholesterol levels. These may be checked every 5 years, or more frequently if you are over 66 years old. Skin check. Lung cancer screening. You may have this screening every year starting at age 37 if you have a 30-pack-year history of smoking and currently smoke or have quit within the past 15 years. Fecal occult blood test (FOBT) of the stool. You may have this test every year starting at age 70. Flexible sigmoidoscopy or colonoscopy. You may have a sigmoidoscopy every 5 years or a colonoscopy every 10 years starting at age 70. Hepatitis C blood test. Hepatitis B blood test. Sexually transmitted disease (STD) testing. Diabetes screening. This is done by checking your blood sugar (glucose) after you have not eaten for a while (fasting). You may have this done every 1-3 years. Bone density scan. This is done to screen for osteoporosis. You may have this done starting at age 70. Mammogram. This may be done every 1-2 years. Talk to your health care provider about how often you should have regular mammograms. Talk with your health care provider about your test results, treatment options, and if necessary, the need for more tests. Vaccines  Your health care provider  may recommend certain vaccines, such as: Influenza vaccine. This is recommended every  year. Tetanus, diphtheria, and acellular pertussis (Tdap, Td) vaccine. You may need a Td booster every 10 years. Zoster vaccine. You may need this after age 70. Pneumococcal 13-valent conjugate (PCV13) vaccine. One dose is recommended after age 70. Pneumococcal polysaccharide (PPSV23) vaccine. One dose is recommended after age 70. Talk to your health care provider about which screenings and vaccines you need and how often you need them. This information is not intended to replace advice given to you by your health care provider. Make sure you discuss any questions you have with your health care provider. Document Released: 05/27/2015 Document Revised: 01/18/2016 Document Reviewed: 03/01/2015 Elsevier Interactive Patient Education  2017 [employer] Prevention in the Home Falls can cause injuries. They can happen to people of all ages. There are many things you can do to make your home safe and to help prevent falls. What can I do on the outside of my home? Regularly fix the edges of walkways and driveways and fix any cracks. Remove anything that might make you trip as you walk through a door, such as a raised step or threshold. Trim any bushes or trees on the path to your home. Use bright outdoor lighting. Clear any walking paths of anything that might make someone trip, such as rocks or tools. Regularly check to see if handrails are loose or broken. Make sure that both sides of any steps have handrails. Any raised decks and porches should have guardrails on the edges. Have any leaves, snow, or ice cleared regularly. Use sand or salt on walking paths during winter. Clean up any spills in your garage right away. This includes oil or grease spills. What can I do in the bathroom? Use night lights. Install grab bars by the toilet and in the tub and shower. Do not use towel bars as grab bars. Use non-skid mats or decals in the tub or shower. If you need to sit down in the shower, use a  plastic, non-slip stool. Keep the floor dry. Clean up any water that spills on the floor as soon as it happens. Remove soap buildup in the tub or shower regularly. Attach bath mats securely with double-sided non-slip rug tape. Do not have throw rugs and other things on the floor that can make you trip. What can I do in the bedroom? Use night lights. Make sure that you have a light by your bed that is easy to reach. Do not use any sheets or blankets that are too big for your bed. They should not hang down onto the floor. Have a firm chair that has side arms. You can use this for support while you get dressed. Do not have throw rugs and other things on the floor that can make you trip. What can I do in the kitchen? Clean up any spills right away. Avoid walking on wet floors. Keep items that you use a lot in easy-to-reach places. If you need to reach something above you, use a strong step stool that has a grab bar. Keep electrical cords out of the way. Do not use floor polish or wax that makes floors slippery. If you must use wax, use non-skid floor wax. Do not have throw rugs and other things on the floor that can make you trip. What can I do with my stairs? Do not leave any items on the stairs. Make sure that there are handrails on both sides  of the stairs and use them. Fix handrails that are broken or loose. Make sure that handrails are as long as the stairways. Check any carpeting to make sure that it is firmly attached to the stairs. Fix any carpet that is loose or worn. Avoid having throw rugs at the top or bottom of the stairs. If you do have throw rugs, attach them to the floor with carpet tape. Make sure that you have a light switch at the top of the stairs and the bottom of the stairs. If you do not have them, ask someone to add them for you. What else can I do to help prevent falls? Wear shoes that: Do not have high heels. Have rubber bottoms. Are comfortable and fit you  well. Are closed at the toe. Do not wear sandals. If you use a stepladder: Make sure that it is fully opened. Do not climb a closed stepladder. Make sure that both sides of the stepladder are locked into place. Ask someone to hold it for you, if possible. Clearly mark and make sure that you can see: Any grab bars or handrails. First and last steps. Where the edge of each step is. Use tools that help you move around (mobility aids) if they are needed. These include: Canes. Walkers. Scooters. Crutches. Turn on the lights when you go into a dark area. Replace any light bulbs as soon as they burn out. Set up your furniture so you have a clear path. Avoid moving your furniture around. If any of your floors are uneven, fix them. If there are any pets around you, be aware of where they are. Review your medicines with your doctor. Some medicines can make you feel dizzy. This can increase your chance of falling. Ask your doctor what other things that you can do to help prevent falls. This information is not intended to replace advice given to you by your health care provider. Make sure you discuss any questions you have with your health care provider. Document Released: 02/24/2009 Document Revised: 10/06/2015 Document Reviewed: 06/04/2014 Elsevier Interactive Patient Education  2017 [employer].

## 2021-03-09 NOTE — Progress Notes (Signed)
Subjective:   Dawn Lowery is a 70 y.o. female who presents for an Initial Medicare Annual Wellness Visit.  I connected with Elyn today by telephone and verified that I am speaking with the correct person using two identifiers. Location patient: home Location provider: work Persons participating in the virtual visit: patient, Marine scientist.    I discussed the limitations, risks, security and privacy concerns of performing an evaluation and management service by telephone and the availability of in person appointments. I also discussed with the patient that there may be a patient responsible charge related to this service. The patient expressed understanding and verbally consented to this telephonic visit.    Interactive audio and video telecommunications were attempted between this provider and patient, however failed, due to patient having technical difficulties OR patient did not have access to video capability.  We continued and completed visit with audio only.  Some vital signs may be absent or patient reported.   Time Spent with patient on telephone encounter: 20 minutes   Review of Systems     Cardiac Risk Factors include: advanced age (>49men, >71 women);hypertension;dyslipidemia     Objective:    Today's Vitals   03/09/21 0901  Weight: 164 lb (74.4 kg)  Height: 5\' 5"  (1.651 m)   Body mass index is 27.29 kg/m.  Advanced Directives 03/09/2021 12/12/2017  Does Patient Have a Medical Advance Directive? No Yes  Type of Advance Directive - [place];Living will  Would patient like information on creating a medical advance directive? No - Patient declined -    Current Medications (verified) Outpatient Encounter Medications as of 03/09/2021  Medication Sig   aspirin 325 MG tablet Take by mouth daily.   atorvastatin (LIPITOR) 80 MG tablet Take 1 tablet (80 mg total) by mouth daily.   hydrochlorothiazide (HYDRODIURIL) 25 MG tablet Take 1 tablet (25 mg total)  by mouth daily.   lisinopril (ZESTRIL) 20 MG tablet Take 1 tablet (20 mg total) by mouth daily.   pantoprazole (PROTONIX) 40 MG tablet Take 1 tablet by mouth once daily   vitamin B-12 (CYANOCOBALAMIN) 1000 MCG tablet Take 1 tablet (1,000 mcg total) by mouth daily.   Zoster Vaccine Adjuvanted [facility]) injection Inject 0.5mg  IM now and again in 2-6 months. (Patient not taking: Reported on 03/09/2021)   No facility-administered encounter medications on file as of 03/09/2021.    Allergies (verified) Morphine and related and Sulfa antibiotics   History: Past Medical History:  Diagnosis Date   Hypertension    Past Surgical History:  Procedure Laterality Date   ABDOMINAL HYSTERECTOMY     ELBOW SURGERY Right 2011   tennis elbow   FOOT SURGERY Right    big toe- pinning due to fracture   HAND SURGERY Left    thumb surgery   Family History  Problem Relation Age of Onset   Lung cancer Mother    Lung cancer Father    Obesity Sister    Hypertension Sister        [name]   Lung cancer Paternal Grandfather    Social History   Socioeconomic History   Marital status: Divorced    Spouse name: Not on file   Number of children: Not on file   Years of education: Not on file   Highest education level: Not on file  Occupational History   Not on file  Tobacco Use   Smoking status: Every Day    Packs/day: 0.50    Years: 20.00  Pack years: 10.00    Types: Cigarettes   Smokeless tobacco: Never  Substance and Sexual Activity   Alcohol use: Not Currently   Drug use: Never   Sexual activity: Not Currently  Other Topics Concern   Not on file  Social History Narrative   Retired Engineer, structural   Son- lives in [place]   Daughter lives locally   5 grandchildren and 1 great grandson   Enjoys spending time with her daughter and relaxing.    2 dogs   Single   Completed HS   Social Determinants of Health   Financial Resource Strain: Low Risk    Difficulty of Paying Living  Expenses: Not hard at all  Food Insecurity: No Food Insecurity   Worried About Charity fundraiser in the Last Year: Never true   Ran Out of Food in the Last Year: Never true  Transportation Needs: No Transportation Needs   Lack of Transportation (Medical): No   Lack of Transportation (Non-Medical): No  Physical Activity: Inactive   Days of Exercise per Week: 0 days   Minutes of Exercise per Session: 0 min  Stress: No Stress Concern Present   Feeling of Stress : Not at all  Social Connections: Socially Isolated   Frequency of Communication with Friends and Family: More than three times a week   Frequency of Social Gatherings with Friends and Family: More than three times a week   Attends Religious Services: Never   Marine scientist or Organizations: No   Attends Music therapist: Never   Marital Status: Divorced    Tobacco Counseling Ready to quit: Not Answered Counseling given: Not Answered   Clinical Intake:  Pre-visit preparation completed: Yes  Pain : No/denies pain     BMI - recorded: 27.29 Nutritional Status: BMI 25 -29 Overweight Nutritional Risks: None Diabetes: No  How often do you need to have someone help you when you read instructions, pamphlets, or other written materials from your doctor or pharmacy?: 1 - Never  Diabetic?No  Interpreter Needed?: No  Information entered by :: Caroleen Hamman LPN   Activities of Daily Living In your present state of health, do you have any difficulty performing the following activities: 03/09/2021 03/06/2021  Hearing? N N  Vision? N N  Difficulty concentrating or making decisions? N N  Walking or climbing stairs? N N  Dressing or bathing? N N  Doing errands, shopping? N N  Preparing Food and eating ? N -  Using the Toilet? N -  In the past six months, have you accidently leaked urine? N -  Do you have problems with loss of bowel control? N -  Managing your Medications? N -  Managing your  Finances? N -  Housekeeping or managing your Housekeeping? N -  Some recent data might be hidden    Patient Care Team: [name], NP as PCP - General (Internal Medicine)  Indicate any recent Medical Services you may have received from other than [facility] providers in the past year (date may be approximate).     Assessment:   This is a routine wellness examination for Dawn Lowery.  Hearing/Vision screen Hearing Screening - Comments:: No issues Vision Screening - Comments:: Last eye exam-6 months ago-Dr. Tora Perches  Dietary issues and exercise activities discussed: Current Exercise Habits: The patient does not participate in regular exercise at present, Exercise limited by: None identified   Goals Addressed  This Visit's Progress    Patient Stated       Would like to walk more       Depression Screen PHQ 2/9 Scores 03/09/2021 03/06/2021 01/29/2020  PHQ - 2 Score 0 0 0  PHQ- 9 Score - - 0    Fall Risk Fall Risk  03/09/2021  Falls in the past year? 0  Number falls in past yr: 0  Injury with Fall? 0  Follow up Falls prevention discussed    FALL RISK PREVENTION PERTAINING TO THE HOME:  Any stairs in or around the home? Yes  If so, are there any without handrails? No  Home free of loose throw rugs in walkways, pet beds, electrical cords, etc? Yes  Adequate lighting in your home to reduce risk of falls? Yes   ASSISTIVE DEVICES UTILIZED TO PREVENT FALLS:  Life alert? No  Use of a cane, walker or w/c? No  Grab bars in the bathroom? No  Shower chair or bench in shower? No  Elevated toilet seat or a handicapped toilet? No   TIMED UP AND GO:  Was the test performed? No . Phone visit   Cognitive Function:Normal cognitive status assessed by this Nurse Health Advisor. No abnormalities found.          Immunizations Immunization History  Administered Date(s) Administered   Fluad Quad(high Dose 65+) 03/06/2021   Moderna SARS-COV2 Booster Vaccination  08/23/2020   Moderna Sars-Covid-2 Vaccination 01/29/2020, 02/26/2020   Pfizer Covid-19 Vaccine Bivalent Booster 61yrs & up 02/20/2021   Prichard Sars-cov-2 Pediatric Vaccine(81mos to <29yrs) 02/20/2021   Pneumococcal Polysaccharide-23 09/02/2020    TDAP status: Due, Education has been provided regarding the importance of this vaccine. Advised may receive this vaccine at local pharmacy or Health Dept. Aware to provide a copy of the vaccination record if obtained from local pharmacy or Health Dept. Verbalized acceptance and understanding.  Flu Vaccine status: Up to date  Pneumococcal vaccine status: Up to date  Covid-19 vaccine status: Completed vaccines  Qualifies for Shingles Vaccine? Yes   Zostavax completed No   Shingrix Completed?: No.    Education has been provided regarding the importance of this vaccine. Patient has been advised to call insurance company to determine out of pocket expense if they have not yet received this vaccine. Advised may also receive vaccine at local pharmacy or Health Dept. Verbalized acceptance and understanding.  Screening Tests Health Maintenance  Topic Date Due   TETANUS/TDAP  Never done   Zoster Vaccines- Shingrix (1 of 2) Never done   COLONOSCOPY (Pts 45-38yrs Insurance coverage will need to be confirmed)  Never done   DEXA SCAN  08/22/2021 (Originally 11/30/2015)   COVID-19 Vaccine (3 - Moderna risk series) 03/20/2021   Pneumonia Vaccine 51+ Years old (2 - PCV) 09/02/2021   MAMMOGRAM  10/12/2022   INFLUENZA VACCINE  Completed   HPV VACCINES  Aged Out   Hepatitis C Screening  Discontinued    Health Maintenance  Health Maintenance Due  Topic Date Due   TETANUS/TDAP  Never done   Zoster Vaccines- Shingrix (1 of 2) Never done   COLONOSCOPY (Pts 45-27yrs Insurance coverage will need to be confirmed)  Never done    Colorectal cancer screening: Type of screening: Colonoscopy. Completed 6-7 years ago per patient. Repeat every 10 years-per  patient.  Mammogram status: Completed bilateral 10/11/2020. Repeat every year  Bone Density status: Due-Declined  Lung Cancer Screening: (Low Dose CT Chest recommended if Age 91-80 years, 30 pack-year currently smoking  OR have quit w/in 15years.) does not qualify.     Additional Screening:  Hepatitis C Screening: does qualify; Declined  Vision Screening: Recommended annual ophthalmology exams for early detection of glaucoma and other disorders of the eye. Is the patient up to date with their annual eye exam?  Yes  Who is the provider or what is the name of the office in which the patient attends annual eye exams? Dr. Tora Perches   Dental Screening: Recommended annual dental exams for proper oral hygiene  Community Resource Referral / Chronic Care Management: CRR required this visit?  No   CCM required this visit?  No      Plan:     I have personally reviewed and noted the following in the patient's chart:   Medical and social history Use of alcohol, tobacco or illicit drugs  Current medications and supplements including opioid prescriptions. Patient is not currently taking opioid prescriptions. Functional ability and status Nutritional status Physical activity Advanced directives List of other physicians Hospitalizations, surgeries, and ER visits in previous 12 months Vitals Screenings to include cognitive, depression, and falls Referrals and appointments  In addition, I have reviewed and discussed with patient certain preventive protocols, quality metrics, and best practice recommendations. A written personalized care plan for preventive services as well as general preventive health recommendations were provided to patient.   Due to this being a telephonic visit, the after visit summary with patients personalized plan was offered to patient via mail or my-chart.  Patient would like to access on my-chart.   Marta Antu, LPN   [id number]  Nurse health  Advisor  Nurse Notes: None

## 2021-03-13 ENCOUNTER — Encounter: Payer: Self-pay | Admitting: Family

## 2021-03-13 MED ORDER — OSELTAMIVIR PHOSPHATE 75 MG PO CAPS: 75.0000 mg | ORAL_CAPSULE | Freq: Two times a day (BID) | ORAL | 0 refills | Status: AC

## 2021-03-13 MED ORDER — ALBUTEROL SULFATE HFA 108 (90 BASE) MCG/ACT IN AERS: 2.0000 | INHALATION_SPRAY | Freq: Four times a day (QID) | RESPIRATORY_TRACT | 0 refills | Status: DC | PRN

## 2021-03-13 MED ORDER — BENZONATATE 100 MG PO CAPS: 100.0000 mg | ORAL_CAPSULE | Freq: Three times a day (TID) | ORAL | 0 refills | Status: DC | PRN

## 2021-03-13 NOTE — Telephone Encounter (Signed)
Information forwarded to provider in another message

## 2021-03-14 ENCOUNTER — Encounter: Payer: Self-pay | Admitting: Family

## 2021-05-02 ENCOUNTER — Encounter: Payer: Self-pay | Admitting: Family

## 2021-05-02 NOTE — Telephone Encounter (Signed)
Patient scheduled to come in tomorrow  

## 2021-05-03 ENCOUNTER — Ambulatory Visit: Payer: Medicaid Other | Admitting: Family

## 2021-05-06 ENCOUNTER — Encounter (HOSPITAL_BASED_OUTPATIENT_CLINIC_OR_DEPARTMENT_OTHER): Payer: Self-pay

## 2021-05-06 ENCOUNTER — Other Ambulatory Visit: Payer: Self-pay

## 2021-05-06 ENCOUNTER — Emergency Department (HOSPITAL_BASED_OUTPATIENT_CLINIC_OR_DEPARTMENT_OTHER)
Admission: EM | Admit: 2021-05-06 | Discharge: 2021-05-06 | Disposition: A | Discharge status: Home / Self Care | Payer: Medicaid Other | Attending: Emergency Medicine | Admitting: Emergency Medicine

## 2021-05-06 ENCOUNTER — Emergency Department (HOSPITAL_BASED_OUTPATIENT_CLINIC_OR_DEPARTMENT_OTHER): Payer: Medicaid Other

## 2021-05-06 DIAGNOSIS — J40 Bronchitis, not specified as acute or chronic: Secondary | ICD-10-CM | POA: Diagnosis not present

## 2021-05-06 DIAGNOSIS — R059 Cough, unspecified: Secondary | ICD-10-CM | POA: Diagnosis present

## 2021-05-06 DIAGNOSIS — J069 Acute upper respiratory infection, unspecified: Secondary | ICD-10-CM | POA: Diagnosis not present

## 2021-05-06 DIAGNOSIS — I10 Essential (primary) hypertension: Secondary | ICD-10-CM | POA: Insufficient documentation

## 2021-05-06 DIAGNOSIS — F17210 Nicotine dependence, cigarettes, uncomplicated: Secondary | ICD-10-CM | POA: Diagnosis not present

## 2021-05-06 DIAGNOSIS — Z20822 Contact with and (suspected) exposure to covid-19: Secondary | ICD-10-CM | POA: Insufficient documentation

## 2021-05-06 DIAGNOSIS — Z7982 Long term (current) use of aspirin: Secondary | ICD-10-CM | POA: Insufficient documentation

## 2021-05-06 DIAGNOSIS — Z79899 Other long term (current) drug therapy: Secondary | ICD-10-CM | POA: Diagnosis not present

## 2021-05-06 LAB — RESP PANEL BY RT-PCR (FLU A&B, COVID) ARPGX2
Influenza A by PCR: NEGATIVE
Influenza B by PCR: NEGATIVE
SARS Coronavirus 2 by RT PCR: NEGATIVE

## 2021-05-06 MED ORDER — PREDNISONE 20 MG PO TABS: 60.0000 mg | ORAL_TABLET | Freq: Every day | ORAL | 0 refills | Status: DC

## 2021-05-06 MED ORDER — IPRATROPIUM-ALBUTEROL 0.5-2.5 (3) MG/3ML IN SOLN
3.0000 mL | Freq: Once | RESPIRATORY_TRACT | Status: AC
  Administered 2021-05-06: 10:00:00 3 mL via RESPIRATORY_TRACT

## 2021-05-06 MED ORDER — PREDNISONE 20 MG PO TABS: 60.0000 mg | ORAL_TABLET | Freq: Every day | ORAL | 0 refills | Status: AC

## 2021-05-06 MED ORDER — IPRATROPIUM-ALBUTEROL 0.5-2.5 (3) MG/3ML IN SOLN
RESPIRATORY_TRACT | Status: AC
  Filled 2021-05-06: qty 3

## 2021-05-06 MED ORDER — PREDNISONE 50 MG PO TABS
60.0000 mg | ORAL_TABLET | Freq: Once | ORAL | Status: AC
  Administered 2021-05-06: 10:00:00 60 mg via ORAL
  Filled 2021-05-06: qty 1

## 2021-05-06 MED ORDER — AZITHROMYCIN 250 MG PO TABS: 250.0000 mg | ORAL_TABLET | Freq: Every day | ORAL | 0 refills | Status: DC

## 2021-05-06 NOTE — ED Provider Notes (Signed)
[facility] EMERGENCY DEPARTMENT Provider Note   CSN: [id number] Arrival date & time: 05/06/21  6761     History Chief Complaint  Patient presents with   Cough    Dawn Lowery is a 70 y.o. female.  The history is provided by the patient.  Cough Cough characteristics:  Non-productive Sputum characteristics:  Nondescript Severity:  Mild Onset quality:  Gradual Duration:  1 week Timing:  Intermittent Progression:  Waxing and waning Chronicity:  New Smoker: yes   Context: upper respiratory infection   Relieved by:  Beta-agonist inhaler Worsened by:  Nothing Associated symptoms: sinus congestion and wheezing   Associated symptoms: no chest pain, no chills, no diaphoresis, no ear fullness, no ear pain, no eye discharge, no fever, no headaches, no rash, no shortness of breath and no sore throat   Risk factors comment:  Smoker     Past Medical History:  Diagnosis Date   Hypertension     Patient Active Problem List   Diagnosis Date Noted   Skin cancer 10/12/2020   Nevus 09/30/2020   GERD (gastroesophageal reflux disease) 09/30/2020   Cerebrovascular accident (CVA) due to embolism of left middle cerebral artery ([place]) 06/14/2020   B12 deficiency 06/14/2020   Tobacco abuse 06/14/2020   Hyperlipidemia 06/14/2020   Essential hypertension 06/14/2020    Past Surgical History:  Procedure Laterality Date   ABDOMINAL HYSTERECTOMY     ELBOW SURGERY Right 2011   tennis elbow   FOOT SURGERY Right    big toe- pinning due to fracture   HAND SURGERY Left    thumb surgery     OB History   No obstetric history on file.     Family History  Problem Relation Age of Onset   Lung cancer Mother    Lung cancer Father    Obesity Sister    Hypertension Sister        [name]   Lung cancer Paternal Grandfather     Social History   Tobacco Use   Smoking status: Every Day    Packs/day: 0.50    Years: 20.00    Pack years: 10.00    Types: Cigarettes    Smokeless tobacco: Never  Substance Use Topics   Alcohol use: Not Currently   Drug use: Never    Home Medications Prior to Admission medications   Medication Sig Start Date End Date Taking? Authorizing Provider  azithromycin (ZITHROMAX) 250 MG tablet Take 1 tablet (250 mg total) by mouth daily. Take first 2 tablets together, then 1 every day until finished. 05/06/21  Yes Airam Runions, DO  predniSONE (DELTASONE) 20 MG tablet Take 3 tablets (60 mg total) by mouth daily for 4 days. 05/06/21 05/10/21 Yes Natsha Guidry, DO  albuterol (VENTOLIN HFA) 108 (90 Base) MCG/ACT inhaler Inhale 2 puffs into the lungs every 6 (six) hours as needed for wheezing or shortness of breath. 03/13/21   [name], NP  aspirin 325 MG tablet Take by mouth daily.    [provider]  atorvastatin (LIPITOR) 80 MG tablet Take 1 tablet (80 mg total) by mouth daily. 03/06/21   [name], NP  benzonatate (TESSALON) 100 MG capsule Take 1 capsule (100 mg total) by mouth 3 (three) times daily as needed. 03/13/21   [name], NP  hydrochlorothiazide (HYDRODIURIL) 25 MG tablet Take 1 tablet (25 mg total) by mouth daily. 03/06/21   [name], NP  lisinopril (ZESTRIL) 20 MG tablet Take 1 tablet (20 mg total) by mouth  daily. 03/06/21   [name], NP  pantoprazole (PROTONIX) 40 MG tablet Take 1 tablet by mouth once daily 02/15/21   [name], NP  vitamin B-12 (CYANOCOBALAMIN) 1000 MCG tablet Take 1 tablet (1,000 mcg total) by mouth daily. 03/06/21   [name], NP  Zoster Vaccine Adjuvanted [facility]) injection Inject 0.5mg  IM now and again in 2-6 months. Patient not taking: Reported on 03/09/2021 09/02/20   [name], NP    Allergies    Morphine and related and Sulfa antibiotics  Review of Systems   Review of Systems  Constitutional:  Negative for chills, diaphoresis and fever.  HENT:  Negative for ear pain and sore throat.   Eyes:   Negative for pain, discharge and visual disturbance.  Respiratory:  Positive for cough and wheezing. Negative for shortness of breath.   Cardiovascular:  Negative for chest pain and palpitations.  Gastrointestinal:  Negative for abdominal pain and vomiting.  Genitourinary:  Negative for dysuria and hematuria.  Musculoskeletal:  Negative for arthralgias and back pain.  Skin:  Negative for color change and rash.  Neurological:  Negative for seizures, syncope and headaches.  All other systems reviewed and are negative.  Physical Exam Updated Vital Signs BP (!) 156/76 (BP Location: Right Arm)    Pulse 91    Temp 97.6 F (36.4 C) (Oral)    Resp 18    Ht 5\' 5"  (1.651 m)    Wt 72.6 kg    SpO2 96%    BMI 26.63 kg/m   Physical Exam Vitals and nursing note reviewed.  Constitutional:      General: She is not in acute distress.    Appearance: She is well-developed. She is not ill-appearing.  HENT:     Head: Normocephalic and atraumatic.     Nose: Nose normal.     Mouth/Throat:     Mouth: Mucous membranes are moist.  Eyes:     Extraocular Movements: Extraocular movements intact.     Conjunctiva/sclera: Conjunctivae normal.     Pupils: Pupils are equal, round, and reactive to light.  Cardiovascular:     Rate and Rhythm: Normal rate and regular rhythm.     Pulses: Normal pulses.     Heart sounds: Normal heart sounds. No murmur heard. Pulmonary:     Effort: Pulmonary effort is normal. No respiratory distress.     Breath sounds: Wheezing present.  Abdominal:     Palpations: Abdomen is soft.     Tenderness: There is no abdominal tenderness.  Musculoskeletal:        General: No swelling.     Cervical back: Neck supple.  Skin:    General: Skin is warm and dry.     Capillary Refill: Capillary refill takes less than 2 seconds.  Neurological:     General: No focal deficit present.     Mental Status: She is alert.  Psychiatric:        Mood and Affect: Mood normal.    ED Results /  Procedures / Treatments   Labs (all labs ordered are listed, but only abnormal results are displayed) Labs Reviewed  RESP PANEL BY RT-PCR (FLU A&B, COVID) [id number]    EKG EKG Interpretation  Date/Time:  Saturday May 06 2021 09:37:20 EST Ventricular Rate:  97 PR Interval:  166 QRS Duration: 83 QT Interval:  362 QTC Calculation: 460 R Axis:   39 Text Interpretation: Sinus rhythm Confirmed by Lennice Sites ([id number]) on 05/06/2021 9:40:56 AM  Radiology DG Chest Portable 1  View  Result Date: 05/06/2021 CLINICAL DATA:  Shortness of breath EXAM: PORTABLE CHEST 1 VIEW COMPARISON:  None. FINDINGS: The heart size and mediastinal contours are within normal limits. Both lungs are clear. The visualized skeletal structures are unremarkable. IMPRESSION: No active disease. Electronically Signed   By: Rolm Baptise M.D.   On: 05/06/2021 10:00    Procedures Procedures   Medications Ordered in ED Medications  ipratropium-albuterol (DUONEB) 0.5-2.5 (3) MG/3ML nebulizer solution 3 mL ( Nebulization Not Given 05/06/21 1005)  predniSONE (DELTASONE) tablet 60 mg (60 mg Oral Given 05/06/21 1006)    ED Course  I have reviewed the triage vital signs and the nursing notes.  Pertinent labs & imaging results that were available during my care of the patient were reviewed by me and considered in my medical decision making (see chart for details).    MDM Rules/Calculators/A&P                          ZELLA DEWAN is here with cough.  No significant medical history.  Is a daily smoker.  Overall has mild wheezing on exam.  Has had dry cough for the past week.  Seems like she has likely COPD exacerbation.  This is not actually diagnosed.  Overall she appears well.  Chest x-ray negative for pneumonia.  No chest pain, no active shortness of breath.  Felt better after breathing treatment.  Given steroids.  Will prescribe Z-Pak, prednisone, breathing treatments as needed.  She had flu several weeks ago and seems  less likely that this will be influenza.  Will test for COVID.  She is outside the window for any COVID treatment.  Overall she appears well.  No hypoxia.  She understands return precautions and discharged in the ED in good condition.  This chart was dictated using voice recognition software.  Despite best efforts to proofread,  errors can occur which can change the documentation meaning.      Final Clinical Impression(s) / ED Diagnoses Final diagnoses:  Viral URI with cough  Bronchitis    Rx / DC Orders ED Discharge Orders          Ordered    predniSONE (DELTASONE) 20 MG tablet  Daily        05/06/21 1027    azithromycin (ZITHROMAX) 250 MG tablet  Daily        05/06/21 1027             Bloxom, Quita Skye, DO 05/06/21 1030

## 2021-05-06 NOTE — ED Triage Notes (Signed)
C/o shortness of breath x 1 week with cough. States coughing up clear phlegm. Been using inhaler and used daughters nebulizer with some relief.

## 2021-05-06 NOTE — Discharge Instructions (Signed)
Chest x-ray negative for pneumonia.  Suspect that this is reactive airway process from her COPD.  Could be virus.  Follow-up your viral testing on your MyChart.  I have prescribed you antibiotic and steroids.  Continue using inhaler 2 to 4 puffs every 4-6 hours as needed.

## 2021-05-08 ENCOUNTER — Other Ambulatory Visit: Payer: Self-pay | Admitting: Family

## 2021-05-09 ENCOUNTER — Other Ambulatory Visit: Payer: Self-pay | Admitting: Family

## 2021-05-11 ENCOUNTER — Other Ambulatory Visit: Payer: Self-pay | Admitting: Nurse Practitioner

## 2021-05-12 ENCOUNTER — Ambulatory Visit: Payer: Medicaid Other | Admitting: Family Medicine

## 2021-05-15 ENCOUNTER — Encounter: Payer: Self-pay | Admitting: Family

## 2021-05-16 ENCOUNTER — Ambulatory Visit: Payer: Medicaid Other | Admitting: Family

## 2021-05-16 MED ORDER — CEPHALEXIN 500 MG PO CAPS: 500.0000 mg | ORAL_CAPSULE | Freq: Two times a day (BID) | ORAL | 0 refills | Status: DC

## 2021-05-17 ENCOUNTER — Encounter: Payer: Self-pay | Admitting: Family

## 2021-05-18 ENCOUNTER — Encounter: Payer: Self-pay | Admitting: Family

## 2021-05-18 ENCOUNTER — Telehealth: Payer: Medicaid Other | Admitting: Nurse Practitioner

## 2021-05-18 NOTE — Telephone Encounter (Signed)
Definitely needs to be seen today. I am out of the office. Can we see if we can get her in with another provider? If there are no openings, she should go to urgent care.

## 2021-05-18 NOTE — Telephone Encounter (Signed)
Patient will get ready and go to an urgent care near her house today. Virtual visit set for today at 4 pm will be cancelled.   She will message [name] back to let us know about her progress.

## 2021-05-19 ENCOUNTER — Other Ambulatory Visit: Payer: Self-pay

## 2021-05-19 ENCOUNTER — Ambulatory Visit (HOSPITAL_BASED_OUTPATIENT_CLINIC_OR_DEPARTMENT_OTHER)
Admission: RE | Admit: 2021-05-19 | Discharge: 2021-05-19 | Disposition: A | Discharge status: Home / Self Care | Payer: Medicaid Other | Source: Ambulatory Visit | Attending: Family | Admitting: Family

## 2021-05-19 ENCOUNTER — Ambulatory Visit (INDEPENDENT_AMBULATORY_CARE_PROVIDER_SITE_OTHER): Payer: Medicaid Other | Admitting: Family

## 2021-05-19 VITALS — BP 135/88 | HR 107 | Temp 98.1°F | Resp 16 | Wt 148.0 lb

## 2021-05-19 DIAGNOSIS — R509 Fever, unspecified: Secondary | ICD-10-CM | POA: Diagnosis not present

## 2021-05-19 DIAGNOSIS — R5383 Other fatigue: Secondary | ICD-10-CM

## 2021-05-19 DIAGNOSIS — Z720 Tobacco use: Secondary | ICD-10-CM

## 2021-05-19 DIAGNOSIS — E876 Hypokalemia: Secondary | ICD-10-CM | POA: Diagnosis not present

## 2021-05-19 DIAGNOSIS — R0602 Shortness of breath: Secondary | ICD-10-CM

## 2021-05-19 LAB — POCT INFLUENZA A/B
Influenza A, POC: NEGATIVE
Influenza B, POC: NEGATIVE

## 2021-05-19 IMAGING — DX DG CHEST 2V
2 series · 2 of 2 positions shown · non-contrast
Comparison: AP chest [DATE]

CLINICAL DATA: Shortness of breath and fever. Evaluate for
pneumonia.

EXAM:
CHEST - 2 VIEW

[chest pa]
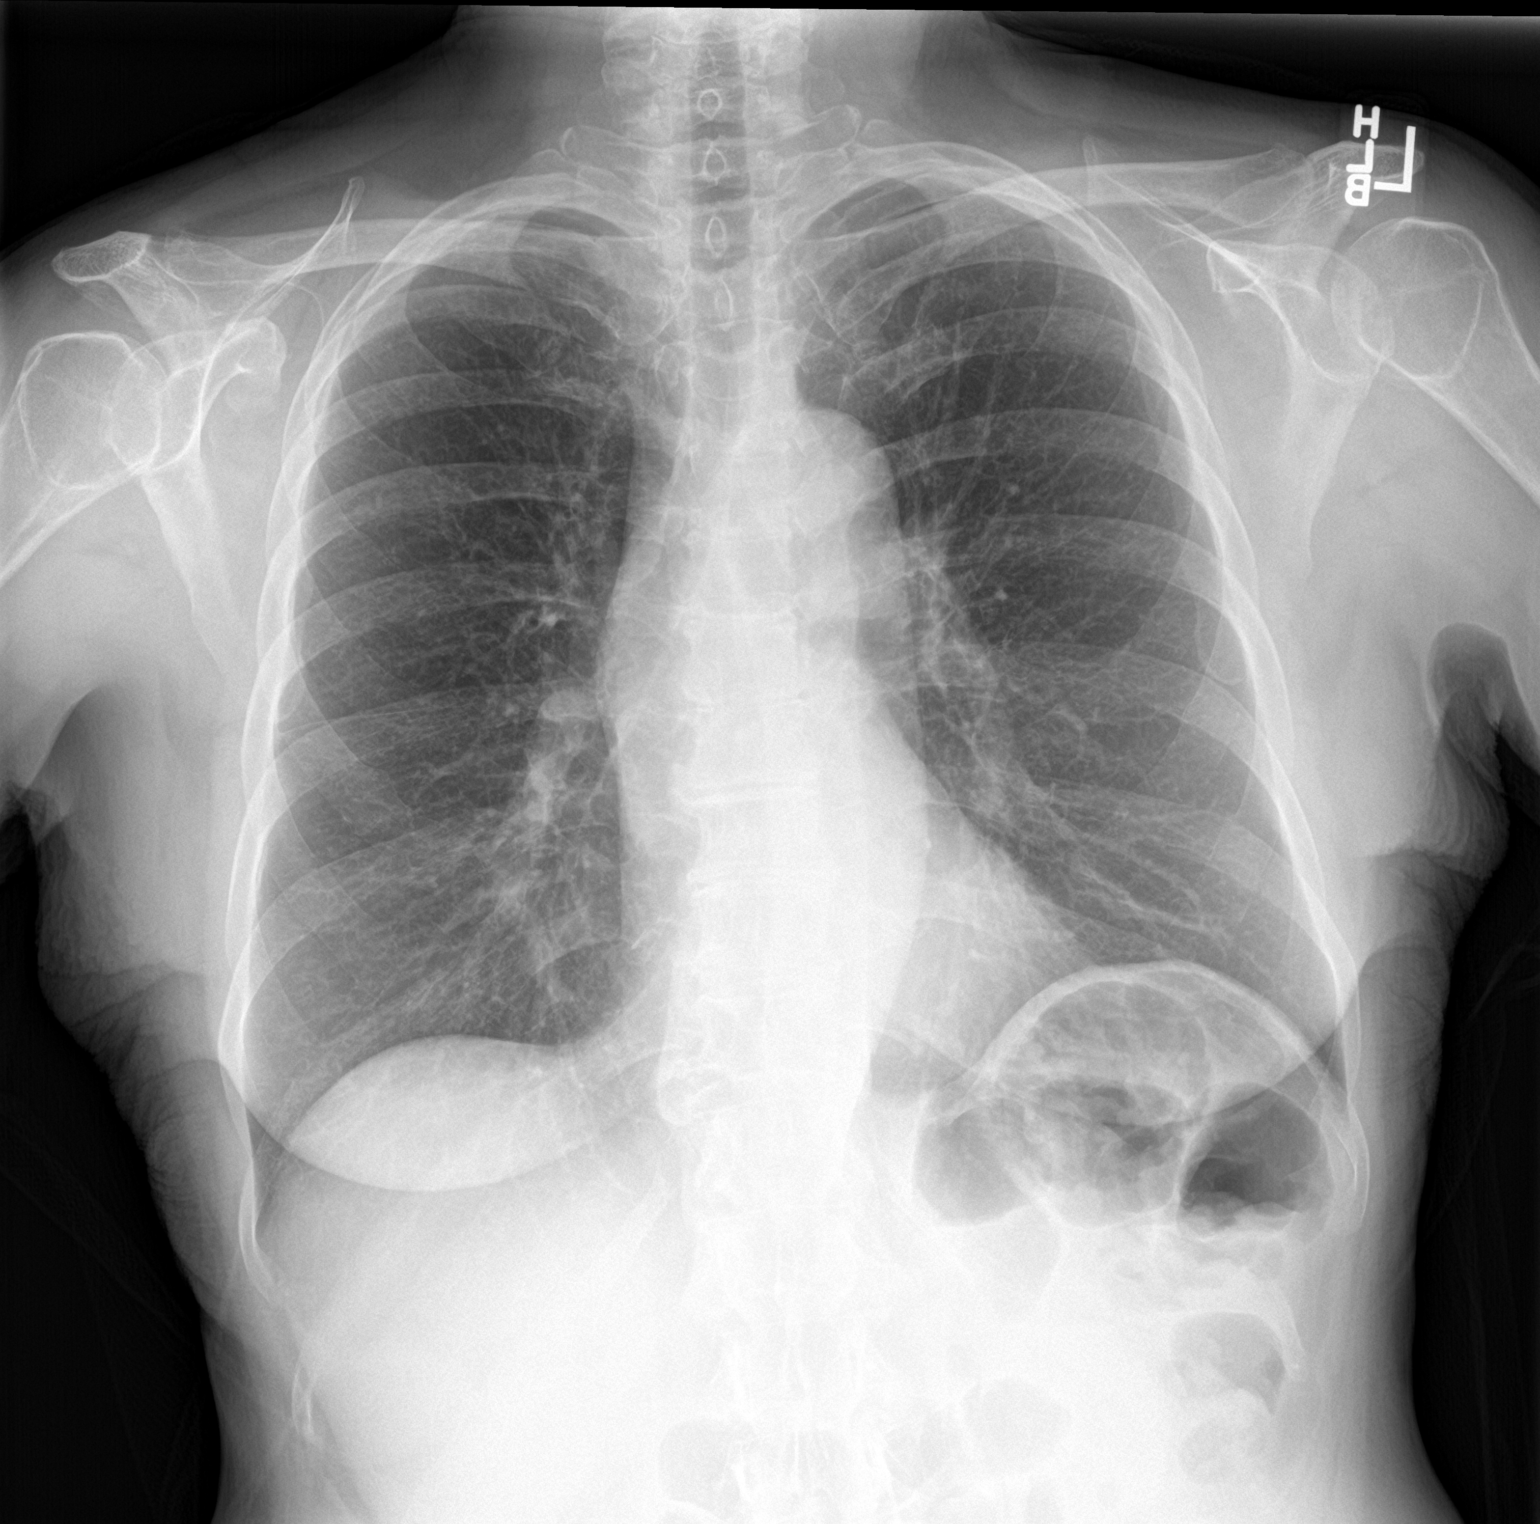

[chest lat]
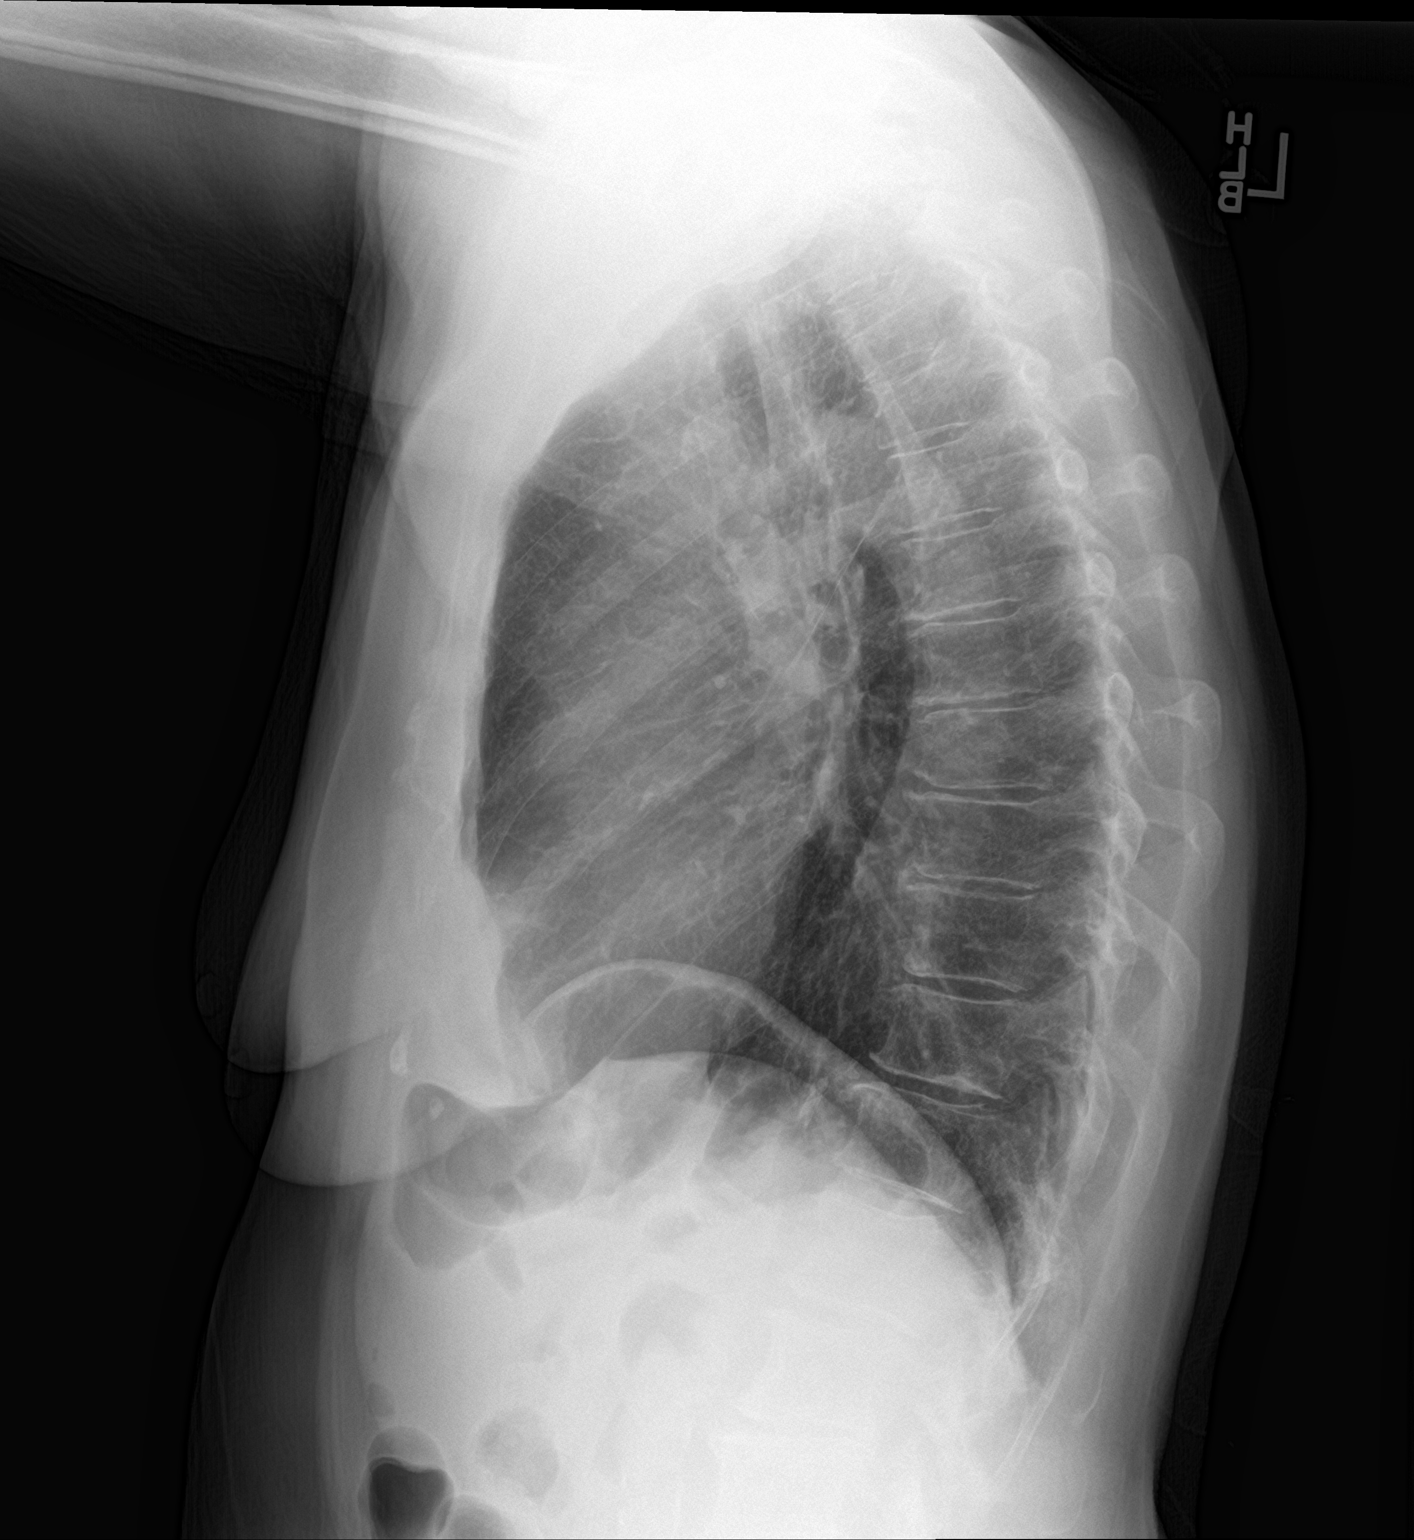

[2 of 2 positions shown; findings below may reference images not displayed]

FINDINGS: The heart size and mediastinal contours are within normal limits.
Mildly tortuous descending thoracic aorta. Mild hyperinflation,
similar to prior. No focal airspace opacity to indicate pneumonia.
No pulmonary edema, pleural effusion, or pneumothorax.
Mild-to-moderate multilevel degenerative disc changes of the
thoracic spine.
IMPRESSION: No active cardiopulmonary disease.  Chronic hyperinflation.

## 2021-05-19 MED ORDER — VARENICLINE TARTRATE 0.5 MG X 11 & 1 MG X 42 PO TBPK: ORAL_TABLET | ORAL | 0 refills | Status: DC

## 2021-05-19 NOTE — Telephone Encounter (Signed)
Patient scheduled to be seen today. 

## 2021-05-19 NOTE — Assessment & Plan Note (Signed)
Check cxr, BNP.  I do think she is likely dehydrated. She is tolerating fluids so I advised her to push her water intake with a goal of 6-8 8 oz glasses a day. Due to fever, will send off a covid PCR.

## 2021-05-19 NOTE — Telephone Encounter (Signed)
Let's work her in today please with me.

## 2021-05-19 NOTE — Patient Instructions (Addendum)
Please complete lab work prior to leaving.  Complete chest x-ray on the first floor. Push fluids (water, popsicles, italian ice, sorbet). Get plenty of rest.

## 2021-05-19 NOTE — Progress Notes (Signed)
Subjective:     Patient ID: Dawn Lowery, female    DOB: 1950/05/25, 71 y.o.   MRN: [id number]  Chief Complaint  Patient presents with   Fever    Complains of temperature 101 yesterday, negative covid test at home yesterday   Fatigue    Complains of feeling very tired    Fever    Patient is a 71 yr old female who presents today with several symptoms.  She reports that symptoms initially began with sneezing 12/1. Thought that it was allergies. She then developed a cough/wheezing. Went to the ED on 05/06/21 for cough- CXR was negative.  She was prescribed oral prednisone (7 days), zpack and albuterol.  She noted no significant improvement in cough.  She was negative for flu and covid via PCR at that visit.   Then went to ED again on 05/12/21 She was given tessalon perles which were helpful for cough. She was given rx for hycodan cough syrup. She was noted to be hypokalemic at that time and was prescribed kdur 18mQ bid x 7 days.  She has 2 days left. Reports that she has not needed the nebulizer recently.   Today, she reports that she has had fever over the last 2 days with Tmax 102. We advised her to go to urgent care yesterday but she was unable to be seen (see mychart message from yesterday for details).  Reports that she has "no energy or strength, poor appetite. She is drinking fluids but "probably not enough." States she has been drinking 2 cups of coffee a day as well as one mountain dew and one 8 oz bottle of water.   Wt Readings from Last 3 Encounters:  05/19/21 148 lb (67.1 kg)  05/06/21 160 lb (72.6 kg)  03/09/21 164 lb (74.4 kg)   She is motivated to quit smoking.   Health Maintenance Due  Topic Date Due   COLONOSCOPY (Pts [id number] coverage will need to be confirmed)  Never done   COVID-19 Vaccine (3 - Moderna risk series) 03/20/2021    Past Medical History:  Diagnosis Date   Hypertension     Past Surgical History:  Procedure Laterality Date    ABDOMINAL HYSTERECTOMY     ELBOW SURGERY Right 2011   tennis elbow   FOOT SURGERY Right    big toe- pinning due to fracture   HAND SURGERY Left    thumb surgery    Family History  Problem Relation Age of Onset   Lung cancer Mother    Lung cancer Father    Obesity Sister    Hypertension Sister        GuJossie Ng Lung cancer Paternal Grandfather     Social History   Socioeconomic History   Marital status: Divorced    Spouse name: Not on file   Number of children: Not on file   Years of education: Not on file   Highest education level: Not on file  Occupational History   Not on file  Tobacco Use   Smoking status: Every Day    Packs/day: 0.50    Years: 20.00    Pack years: 10.00    Types: Cigarettes   Smokeless tobacco: Never  Substance and Sexual Activity   Alcohol use: Not Currently   Drug use: Never   Sexual activity: Not Currently  Other Topics Concern   Not on file  Social History Narrative   Retired clEngineer, 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    2 dogs   Single   Completed HS   Social Determinants of Health   Financial Resource Strain: Low Risk    Difficulty of Paying Living Expenses: Not hard at all  Food Insecurity: No Food Insecurity   Worried About Charity fundraiser in the Last Year: Never true   Ran Out of Food in the Last Year: Never true  Transportation Needs: No Transportation Needs   Lack of Transportation (Medical): No   Lack of Transportation (Non-Medical): No  Physical Activity: Inactive   Days of Exercise per Week: 0 days   Minutes of Exercise per Session: 0 min  Stress: No Stress Concern Present   Feeling of Stress : Not at all  Social Connections: Socially Isolated   Frequency of Communication with Friends and Family: More than three times a week   Frequency of Social Gatherings with Friends and Family: More  than three times a week   Attends Religious Services: Never   Marine scientist or Organizations: No   Attends Music therapist: Never   Marital Status: Divorced  Human resources officer Violence: Not At Risk   Fear of Current or Ex-Partner: No   Emotionally Abused: No   Physically Abused: No   Sexually Abused: No    Outpatient Medications Prior to Visit  Medication Sig Dispense Refill   aspirin 325 MG tablet Take by mouth daily.     atorvastatin (LIPITOR) 80 MG tablet Take 1 tablet (80 mg total) by mouth daily. 90 tablet 1   cephALEXin (KEFLEX) 500 MG capsule Take 1 capsule (500 mg total) by mouth 2 (two) times daily. 10 capsule 0   hydrochlorothiazide (HYDRODIURIL) 25 MG tablet Take 1 tablet (25 mg total) by mouth daily. 90 tablet 1   lisinopril (ZESTRIL) 20 MG tablet Take 1 tablet (20 mg total) by mouth daily. 90 tablet 1   pantoprazole (PROTONIX) 40 MG tablet Take 1 tablet by mouth once daily 90 tablet 1   potassium chloride SA (KLOR-CON M) 20 MEQ tablet Take by mouth.     VENTOLIN HFA 108 (90 Base) MCG/ACT inhaler INHALE 2 PUFFS BY MOUTH EVERY 6 HOURS AS NEEDED FOR WHEEZING FOR SHORTNESS OF BREATH 18 g 0   vitamin B-12 (CYANOCOBALAMIN) 1000 MCG tablet Take 1 tablet (1,000 mcg total) by mouth daily.     Zoster Vaccine Adjuvanted [facility]) injection Inject 0.99m IM now and again in 2-6 months. 0.5 mL 1   azithromycin (ZITHROMAX) 250 MG tablet Take 1 tablet (250 mg total) by mouth daily. Take first 2 tablets together, then 1 every day until finished. 6 tablet 0   benzonatate (TESSALON) 100 MG capsule Take 1 capsule (100 mg total) by mouth 3 (three) times daily as needed. 20 capsule 0   No facility-administered medications prior to visit.    Allergies  Allergen Reactions   Morphine And Related Nausea And Vomiting   Sulfa Antibiotics Hives    Review of Systems  Constitutional:  Positive for fever.     See HPI Objective:    Physical Exam Constitutional:       General: She is not in acute distress.    Appearance: Normal appearance. She is well-developed. She is not ill-appearing or toxic-appearing.     Comments: Looks tired  HENT:     Head: Normocephalic and atraumatic.  Right Ear: External ear normal.     Left Ear: External ear normal.  Eyes:     General: No scleral icterus. Neck:     Thyroid: No thyromegaly.  Cardiovascular:     Rate and Rhythm: Normal rate and regular rhythm.     Heart sounds: Normal heart sounds. No murmur heard. Pulmonary:     Effort: Pulmonary effort is normal. No respiratory distress.     Breath sounds: Normal breath sounds. No wheezing.  Musculoskeletal:     Cervical back: Neck supple.  Skin:    General: Skin is warm and dry.  Neurological:     Mental Status: She is alert and oriented to person, place, and time.  Psychiatric:        Mood and Affect: Mood normal.        Behavior: Behavior normal.        Thought Content: Thought content normal.        Judgment: Judgment normal.    BP 135/88 (BP Location: Right Arm, Patient Position: Sitting, Cuff Size: Small)    Pulse (!) 107    Temp 98.1 F (36.7 C) (Oral)    Resp 16    Wt 148 lb (67.1 kg)    SpO2 100%    BMI 24.63 kg/m  Wt Readings from Last 3 Encounters:  05/19/21 148 lb (67.1 kg)  05/06/21 160 lb (72.6 kg)  03/09/21 164 lb (74.4 kg)       Assessment & Plan:   Problem List Items Addressed This Visit       Unprioritized   Tobacco abuse    She is motivated to quit. Trial of chantix. Common side effects including rare risk of suicide ideation was discussed with the patient today.  Patient is instructed to go directly to the ED if this occurs.  We discussed that patient can continue to smoke for 1 week after starting chantix, but then must discontinue cigarettes.  She is also instructed to contact us prior to completion of the starter month pack for an rx for the continuation month pack.  5 minutes spent with patient today on tobacco cessation  counseling.        SOB (shortness of breath) - Primary    Check cxr, BNP.  I do think she is likely dehydrated. She is tolerating fluids so I advised her to push her water intake with a goal of 6-8 8 oz glasses a day. Due to fever, will send off a covid PCR.        Relevant Orders   POCT Influenza A/B (Completed)   Novel Coronavirus, NAA ([facility])   CBC with Differential/Platelet   DG Chest 2 View   B Nat Peptide   Other Visit Diagnoses     Fatigue, unspecified type       Relevant Orders   POCT Influenza A/B (Completed)   Novel Coronavirus, NAA ([facility])   CBC with Differential/Platelet   Magnesium   TSH   B Nat Peptide   Hypokalemia       Relevant Orders   Comp Met (CMET)   Fever, unspecified fever cause       Relevant Orders   Urinalysis, Routine w reflex microscopic   Urine Culture       I have discontinued Marline Backbone "[name]"'s benzonatate and azithromycin. I am also having her maintain her aspirin, Shingrix, pantoprazole, vitamin B-12, lisinopril, atorvastatin, hydrochlorothiazide, Ventolin HFA, cephALEXin, and potassium chloride SA.  No orders of the defined types were  placed in this encounter.

## 2021-05-19 NOTE — Telephone Encounter (Signed)
Patient will be seen this afternoon

## 2021-05-19 NOTE — Assessment & Plan Note (Signed)
She is motivated to quit. Trial of chantix. Common side effects including rare risk of suicide ideation was discussed with the patient today.  Patient is instructed to go directly to the ED if this occurs.  We discussed that patient can continue to smoke for 1 week after starting chantix, but then must discontinue cigarettes.  She is also instructed to contact us prior to completion of the starter month pack for an rx for the continuation month pack.  5 minutes spent with patient today on tobacco cessation counseling.

## 2021-05-20 LAB — CBC WITH DIFFERENTIAL/PLATELET
Absolute Monocytes: 595 cells/uL (ref 200–950)
Basophils Absolute: 68 cells/uL (ref 0–200)
Basophils Relative: 1.1 %
Eosinophils Absolute: 837 cells/uL — ABNORMAL HIGH (ref 15–500)
Eosinophils Relative: 13.5 %
HCT: 41.4 % (ref 35.0–45.0)
Hemoglobin: 14.5 g/dL (ref 11.7–15.5)
Lymphs Abs: 1711 cells/uL (ref 850–3900)
MCH: 31.4 pg (ref 27.0–33.0)
MCHC: 35 g/dL (ref 32.0–36.0)
MCV: 89.6 fL (ref 80.0–100.0)
MPV: 11.2 fL (ref 7.5–12.5)
Monocytes Relative: 9.6 %
Neutro Abs: 2988 cells/uL (ref 1500–7800)
Neutrophils Relative %: 48.2 %
Platelets: 284 10*3/uL (ref 140–400)
RBC: 4.62 10*6/uL (ref 3.80–5.10)
RDW: 12.4 % (ref 11.0–15.0)
Total Lymphocyte: 27.6 %
WBC: 6.2 10*3/uL (ref 3.8–10.8)

## 2021-05-20 LAB — COMPREHENSIVE METABOLIC PANEL
AG Ratio: 1.4 (calc) (ref 1.0–2.5)
ALT: 23 U/L (ref 6–29)
AST: 25 U/L (ref 10–35)
Albumin: 3.9 g/dL (ref 3.6–5.1)
Alkaline phosphatase (APISO): 119 U/L (ref 37–153)
BUN: 11 mg/dL (ref 7–25)
CO2: 25 mmol/L (ref 20–32)
Calcium: 9.5 mg/dL (ref 8.6–10.4)
Chloride: 101 mmol/L (ref 98–110)
Creat: 0.89 mg/dL (ref 0.60–1.00)
Globulin: 2.8 g/dL (calc) (ref 1.9–3.7)
Glucose, Bld: 96 mg/dL (ref 65–99)
Potassium: 4.1 mmol/L (ref 3.5–5.3)
Sodium: 136 mmol/L (ref 135–146)
Total Bilirubin: 0.7 mg/dL (ref 0.2–1.2)
Total Protein: 6.7 g/dL (ref 6.1–8.1)

## 2021-05-20 LAB — URINALYSIS, ROUTINE W REFLEX MICROSCOPIC
Bacteria, UA: NONE SEEN /HPF
Bilirubin Urine: NEGATIVE
Glucose, UA: NEGATIVE
Hgb urine dipstick: NEGATIVE
Leukocytes,Ua: NEGATIVE
Nitrite: NEGATIVE
RBC / HPF: NONE SEEN /HPF (ref 0–2)
Specific Gravity, Urine: 1.019 (ref 1.001–1.035)
pH: 6.5 (ref 5.0–8.0)

## 2021-05-20 LAB — URINE CULTURE
MICRO NUMBER:: 12837740
Result:: NO GROWTH
SPECIMEN QUALITY:: ADEQUATE

## 2021-05-20 LAB — MAGNESIUM: Magnesium: 1.7 mg/dL (ref 1.5–2.5)

## 2021-05-20 LAB — BRAIN NATRIURETIC PEPTIDE: Brain Natriuretic Peptide: 7 pg/mL (ref <100)

## 2021-05-20 LAB — TSH: TSH: 2.38 mIU/L (ref 0.40–4.50)

## 2021-05-21 ENCOUNTER — Encounter: Payer: Self-pay | Admitting: Family

## 2021-05-21 LAB — NOVEL CORONAVIRUS, NAA: SARS-CoV-2, NAA: NOT DETECTED

## 2021-05-21 LAB — SARS-COV-2, NAA 2 DAY TAT

## 2021-05-22 ENCOUNTER — Ambulatory Visit: Payer: Medicaid Other | Admitting: Family

## 2021-05-24 ENCOUNTER — Other Ambulatory Visit: Payer: Self-pay | Admitting: Family

## 2021-05-25 ENCOUNTER — Other Ambulatory Visit: Payer: Self-pay | Admitting: Family

## 2021-05-25 ENCOUNTER — Encounter: Payer: Self-pay | Admitting: Family

## 2021-05-25 DIAGNOSIS — N390 Urinary tract infection, site not specified: Secondary | ICD-10-CM

## 2021-05-25 MED ORDER — PANTOPRAZOLE SODIUM 40 MG PO TBEC: 40.0000 mg | DELAYED_RELEASE_TABLET | Freq: Every day | ORAL | 1 refills | Status: DC

## 2021-05-25 NOTE — Telephone Encounter (Signed)
Patient advised, she is not able to come in today but was given lab appointment to bring specimen tomorrow

## 2021-05-26 ENCOUNTER — Other Ambulatory Visit: Payer: Medicaid Other

## 2021-05-26 DIAGNOSIS — N390 Urinary tract infection, site not specified: Secondary | ICD-10-CM

## 2021-05-28 ENCOUNTER — Telehealth: Payer: Self-pay | Admitting: Family

## 2021-05-28 LAB — URINE CULTURE
MICRO NUMBER:: 12868472
SPECIMEN QUALITY:: ADEQUATE

## 2021-05-28 MED ORDER — AMOXICILLIN-POT CLAVULANATE 875-125 MG PO TABS: 1.0000 | ORAL_TABLET | Freq: Two times a day (BID) | ORAL | 0 refills | Status: AC

## 2021-05-28 NOTE — Telephone Encounter (Signed)
See mychart.  

## 2021-06-05 ENCOUNTER — Encounter: Payer: Self-pay | Admitting: Family

## 2021-06-05 ENCOUNTER — Other Ambulatory Visit: Payer: Self-pay

## 2021-06-05 ENCOUNTER — Other Ambulatory Visit: Payer: Medicaid Other

## 2021-06-05 DIAGNOSIS — R300 Dysuria: Secondary | ICD-10-CM

## 2021-06-05 NOTE — Telephone Encounter (Signed)
Let's have pt come give UA and Culture today, dx dysuria.  I would like to confirm UTI before starting another antibiotic since the most recent culture shows that the bacteria should have been covered by both the Keflex and the Augmentin.

## 2021-06-06 ENCOUNTER — Encounter: Payer: Self-pay | Admitting: Family

## 2021-06-07 ENCOUNTER — Ambulatory Visit: Payer: Self-pay

## 2021-06-07 ENCOUNTER — Ambulatory Visit (INDEPENDENT_AMBULATORY_CARE_PROVIDER_SITE_OTHER): Payer: Medicaid Other

## 2021-06-07 ENCOUNTER — Ambulatory Visit (INDEPENDENT_AMBULATORY_CARE_PROVIDER_SITE_OTHER): Payer: Medicaid Other | Admitting: Orthopedic Surgery

## 2021-06-07 ENCOUNTER — Other Ambulatory Visit: Payer: Self-pay

## 2021-06-07 DIAGNOSIS — M25512 Pain in left shoulder: Secondary | ICD-10-CM | POA: Diagnosis not present

## 2021-06-07 DIAGNOSIS — M79602 Pain in left arm: Secondary | ICD-10-CM | POA: Diagnosis not present

## 2021-06-07 DIAGNOSIS — M79601 Pain in right arm: Secondary | ICD-10-CM

## 2021-06-07 LAB — URINE CULTURE
MICRO NUMBER:: 12905476
SPECIMEN QUALITY:: ADEQUATE

## 2021-06-07 NOTE — Progress Notes (Signed)
Office Visit Note   Patient: Dawn Lowery           Date of Birth: [birth year]           MRN: [id number] Visit Date: 06/07/2021 Requested by: [name], NP [address] [place],  [place] [address] PCP: [name], NP  Subjective: Chief Complaint  Patient presents with   Other     Bilateral arm pain    HPI: Dawn Lowery is a 71 year old patient with bilateral arm pain.  She had a fall with hyperextension of the neck in 2022.  She was playing with a dog.  She did have some chin abrasions.  Reports bilateral arm pain since that time.  Initially had neck pain after the fall but currently is not having much in the way of neck symptoms focally in the neck.  She reports radicular arm pain left worse than right.  No scapular pain.  She does describe numbness and tingling in the arms and hands left more than right.  She states that both arms ache like a toothache.  She did have a stroke last January.  She lives by herself.  She also describes some mechanical symptoms in the left shoulder since that accident.  Sleeping is a problem.  Tries ibuprofen and Tylenol without much relief.              ROS: All systems reviewed are negative as they relate to the chief complaint within the history of present illness.  Patient denies  fevers or chills.   Assessment & Plan: Visit Diagnoses:  1. Bilateral arm pain   2. Left shoulder pain, unspecified chronicity     Plan: Impression is bilateral arm pain and which could be radiculopathy from central canal stenosis based on mechanism of injury a year ago.  Alternatively this could also be intrinsic left shoulder pathology based on exam with possible rotator cuff and/or labral pathology present.  Symptoms ongoing for over a year.  Mechanism of injury consistent with either shoulder and/or neck pathology.  Failure of conservative management is present.  MRI arthrogram left shoulder indicated to evaluate for rotator cuff tear versus labral  pathology.  MRI C-spine indicated to evaluate bilateral radiculopathy in the setting of possible remote cervical spine injury from hyperextension.  Follow-up after the studies  Follow-Up Instructions: No follow-ups on file.   Orders:  Orders Placed This Encounter  Procedures   XR Cervical Spine 2 or 3 views   XR Shoulder Right   XR Shoulder Left   MR Cervical Spine w/o contrast   MR Shoulder Left w/ contrast   Arthrogram   No orders of the defined types were placed in this encounter.     Procedures: No procedures performed   Clinical Data: No additional findings.  Objective: Vital Signs: There were no vitals taken for this visit.  Physical Exam:   Constitutional: Patient appears well-developed HEENT:  Head: Normocephalic Eyes:EOM are normal Neck: Normal range of motion Cardiovascular: Normal rate Pulmonary/chest: Effort normal Neurologic: Patient is alert Skin: Skin is warm Psychiatric: Patient has normal mood and affect   Ortho Exam: Ortho exam demonstrates full active and passive range of motion of both arms shoulders and hands.  She does have some coarse grinding on the left with internal and external rotation at 90 degrees of abduction.  O'Brien's testing negative bilaterally.  No discrete AC joint tenderness bilaterally.  Pretty reasonable rotator cuff strength testing infraspinatus supraspinatus and subscap muscle testing  on both sides.  No masses lymphadenopathy or skin changes noted in either shoulder girdle region.  No scapular dyskinesia with forward flexion.  No definite paresthesias C5 5 to T1.  No subluxation of the ulnar nerve on either side.  5 out of 5 grip EPL FPL interosseous wrist flexion extension bicep triceps and deltoid strength.  Of note is that her symptoms do improve by putting her arms up over her head and the history portion.  Negative apprehension relocation testing.  Specialty Comments:  No specialty comments available.  Imaging: No  results found.   PMFS History: Patient Active Problem List   Diagnosis Date Noted   SOB (shortness of breath) 05/19/2021   Skin cancer 10/12/2020   [place] 09/30/2020   GERD (gastroesophageal reflux disease) 09/30/2020   Cerebrovascular accident (CVA) due to embolism of left middle cerebral artery ([place]) 06/14/2020   B12 deficiency 06/14/2020   Tobacco abuse 06/14/2020   Hyperlipidemia 06/14/2020   Essential hypertension 06/14/2020   Past Medical History:  Diagnosis Date   Hypertension     Family History  Problem Relation Age of Onset   Lung cancer Mother    Lung cancer Father    Obesity Sister    Hypertension Sister        [name]   Lung cancer Paternal Grandfather     Past Surgical History:  Procedure Laterality Date   ABDOMINAL HYSTERECTOMY     ELBOW SURGERY Right 2011   tennis elbow   FOOT SURGERY Right    big toe- pinning due to fracture   HAND SURGERY Left    thumb surgery   Social History   Occupational History   Not on file  Tobacco Use   Smoking status: Every Day    Packs/day: 0.50    Years: 20.00    Pack years: 10.00    Types: Cigarettes   Smokeless tobacco: Never  Substance and Sexual Activity   Alcohol use: Not Currently   Drug use: Never   Sexual activity: Not Currently

## 2021-06-08 ENCOUNTER — Encounter: Payer: Self-pay | Admitting: Family

## 2021-06-08 ENCOUNTER — Encounter: Payer: Self-pay | Admitting: Orthopedic Surgery

## 2021-06-09 ENCOUNTER — Telehealth: Payer: Self-pay | Admitting: Family

## 2021-06-09 DIAGNOSIS — N390 Urinary tract infection, site not specified: Secondary | ICD-10-CM

## 2021-06-09 MED ORDER — AMOXICILLIN-POT CLAVULANATE 875-125 MG PO TABS: 1.0000 | ORAL_TABLET | Freq: Two times a day (BID) | ORAL | 0 refills | Status: DC

## 2021-06-09 NOTE — Telephone Encounter (Signed)
See mychart.  

## 2021-06-13 ENCOUNTER — Encounter: Payer: Self-pay | Admitting: Family

## 2021-06-19 MED ORDER — CEPHALEXIN 500 MG PO CAPS: 500.0000 mg | ORAL_CAPSULE | Freq: Four times a day (QID) | ORAL | 0 refills | Status: DC

## 2021-06-19 NOTE — Addendum Note (Signed)
Addended by: [name] on: 06/19/2021 12:47 PM   Modules accepted: Orders

## 2021-06-21 ENCOUNTER — Ambulatory Visit (HOSPITAL_BASED_OUTPATIENT_CLINIC_OR_DEPARTMENT_OTHER)
Admission: RE | Admit: 2021-06-21 | Discharge: 2021-06-21 | Disposition: A | Discharge status: Home / Self Care | Payer: Medicaid Other | Source: Ambulatory Visit | Attending: Family | Admitting: Family

## 2021-06-21 ENCOUNTER — Other Ambulatory Visit: Payer: Self-pay

## 2021-06-21 ENCOUNTER — Ambulatory Visit (INDEPENDENT_AMBULATORY_CARE_PROVIDER_SITE_OTHER): Payer: Medicaid Other | Admitting: Family

## 2021-06-21 VITALS — BP 116/68 | HR 102 | Temp 98.5°F | Resp 16 | Wt 148.0 lb

## 2021-06-21 DIAGNOSIS — N3000 Acute cystitis without hematuria: Secondary | ICD-10-CM | POA: Diagnosis not present

## 2021-06-21 DIAGNOSIS — J449 Chronic obstructive pulmonary disease, unspecified: Secondary | ICD-10-CM | POA: Insufficient documentation

## 2021-06-21 DIAGNOSIS — J441 Chronic obstructive pulmonary disease with (acute) exacerbation: Secondary | ICD-10-CM

## 2021-06-21 IMAGING — CR DG CHEST 2V
2 series · 2 of 2 positions shown · non-contrast
Comparison: [DATE]

CLINICAL DATA: Cough and wheezing. Chest tightness for the past 4
days. Clinical concern for COPD.

EXAM:
CHEST - 2 VIEW

[w chest pa]
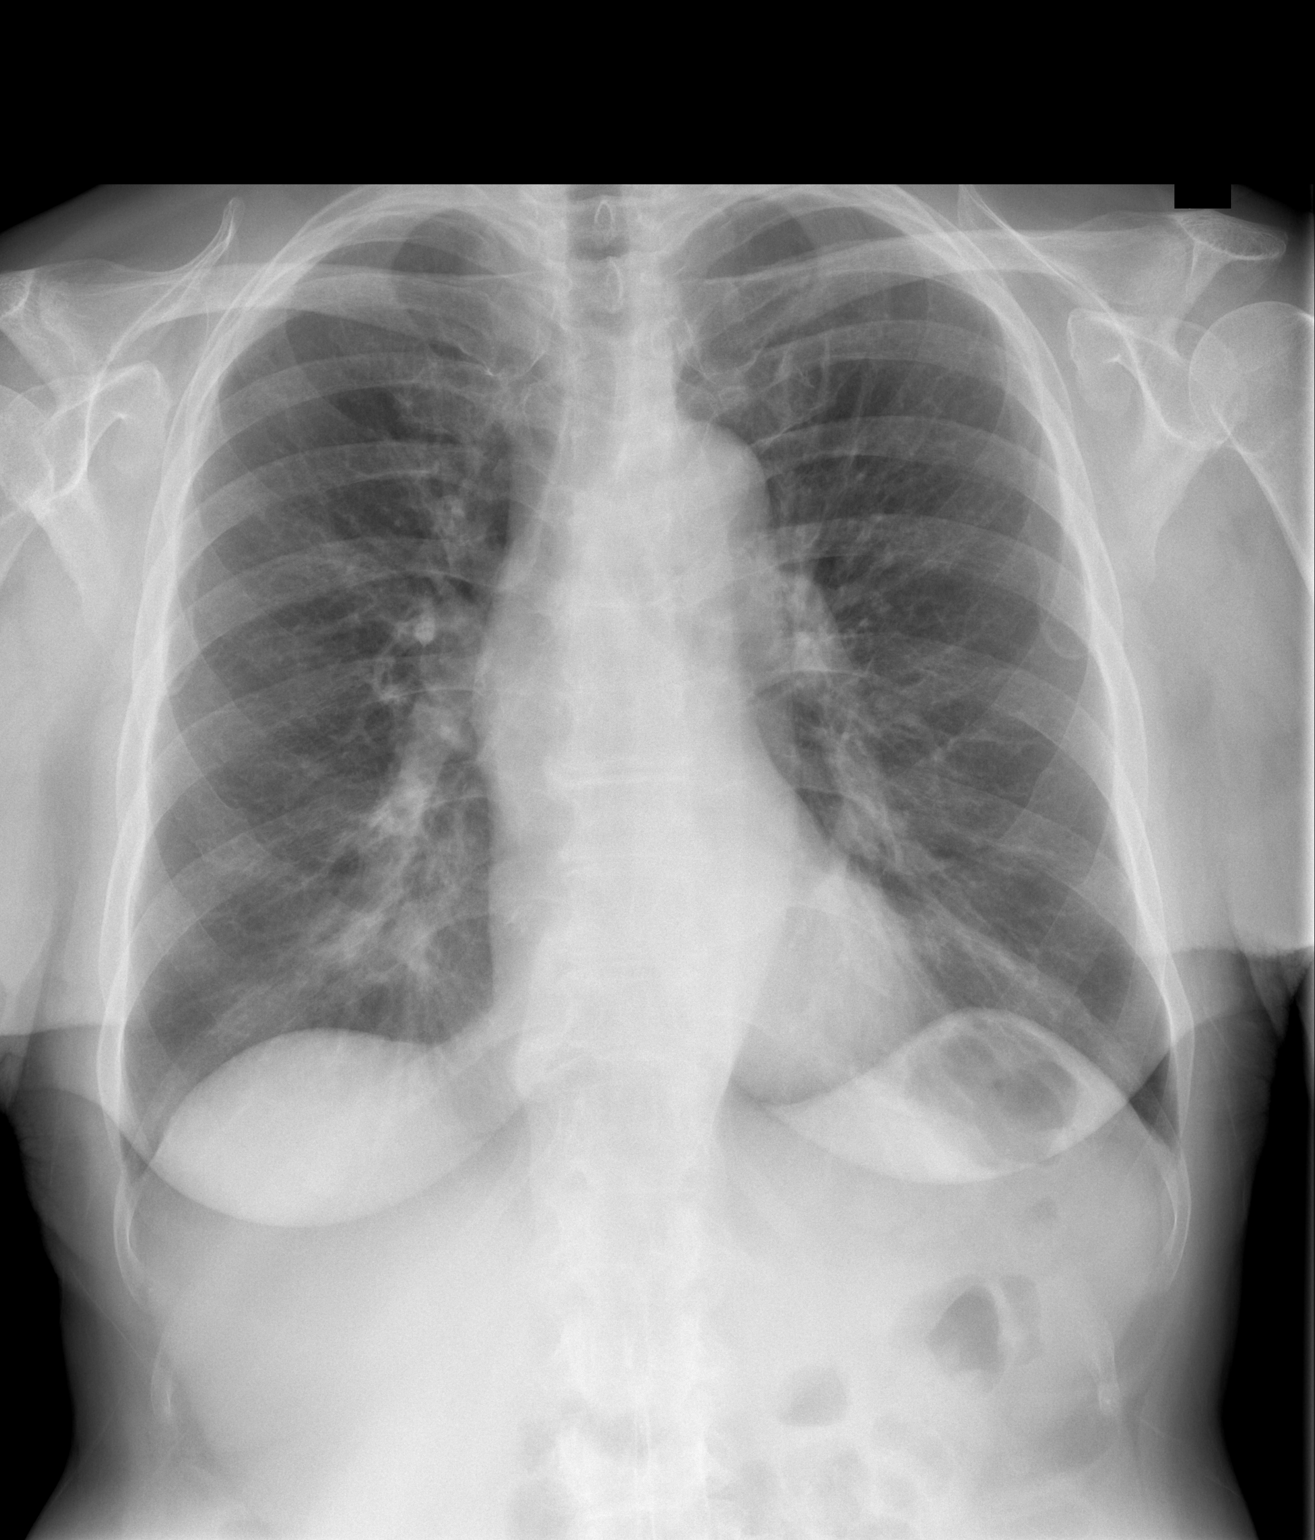

[w chest lat]
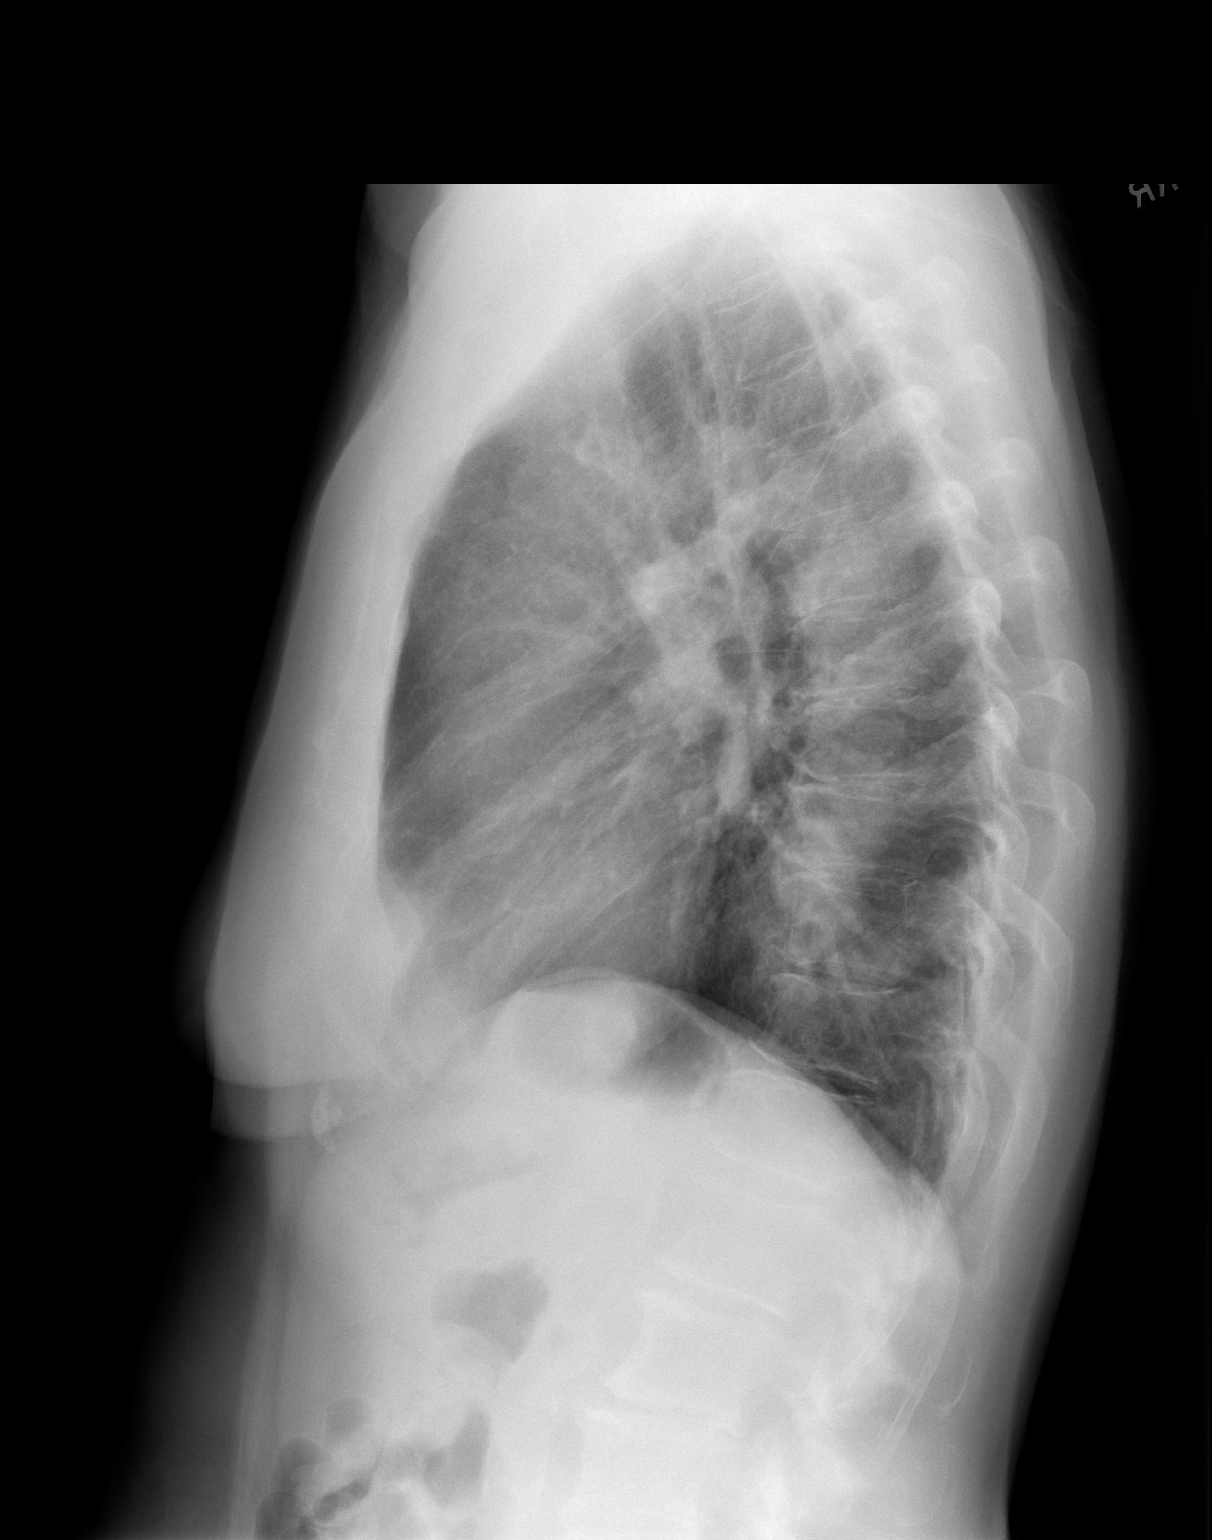

[2 of 2 positions shown; findings below may reference images not displayed]

FINDINGS: Normal sized heart. Tortuous aorta. Clear lungs. Normal lung
volumes. Mild peribronchial thickening. Mild thoracic spine and
upper lumbar spine degenerative changes.
IMPRESSION: Mild bronchitic changes.  No evidence of COPD.

## 2021-06-21 MED ORDER — PREDNISONE 10 MG PO TABS: ORAL_TABLET | ORAL | 0 refills | Status: DC

## 2021-06-21 MED ORDER — METHYLPREDNISOLONE SODIUM SUCC 125 MG IJ SOLR
125.0000 mg | Freq: Once | INTRAMUSCULAR | Status: AC
  Administered 2021-06-21: 125 mg via INTRAMUSCULAR

## 2021-06-21 MED ORDER — HYDROCOD POLI-CHLORPHE POLI ER 10-8 MG/5ML PO SUER: 5.0000 mL | Freq: Two times a day (BID) | ORAL | 0 refills | Status: DC | PRN

## 2021-06-21 MED ORDER — ALBUTEROL SULFATE HFA 108 (90 BASE) MCG/ACT IN AERS: 2.0000 | INHALATION_SPRAY | Freq: Four times a day (QID) | RESPIRATORY_TRACT | 0 refills | Status: DC | PRN

## 2021-06-21 NOTE — Progress Notes (Signed)
Subjective:   By signing my name below, I, Zite Okoli, attest that this documentation has been prepared under the direction and in the presence ofO'Sullivan, Lenna Sciara, NP 06/21/2021    Patient ID: Dawn Lowery, female    DOB: Jun 04, 1950, 71 y.o.   MRN: [id number]  Chief Complaint  Patient presents with   Shortness of Breath    Complains of having trouble breathing, seen by EMS at home yesterday had normal ekg.     HPI Patient is in today for an office visit  Respiratory Problems- She reports that she started sneezing and coughing on Sunday and started zyrtec. Her chest was feeling tight on Tuesday and she started breathing treatments. She adds there was difficulty breathing yesterday and they had to call EMS . Pulse and blood pressure was good. She adds that her chest  feels better today but she is not sleeping, eating and is extremely tired. She reports that since she started Keflex, there was itching and a rash developed on her abdomen. She has not smoked for the past 2 days and plans to quit. She adds that the cough gets really bad that she is losing her breath. She is requesting for a refill on tussionex because it provided great relief the last time she had a cough.  Past Medical History:  Diagnosis Date   Hypertension     Past Surgical History:  Procedure Laterality Date   ABDOMINAL HYSTERECTOMY     ELBOW SURGERY Right 2011   tennis elbow   FOOT SURGERY Right    big toe- pinning due to fracture   HAND SURGERY Left    thumb surgery    Family History  Problem Relation Age of Onset   Lung cancer Mother    Lung cancer Father    Obesity Sister    Hypertension Sister        [name]   Lung cancer Paternal Grandfather     Social History   Socioeconomic History   Marital status: Divorced    Spouse name: Not on file   Number of children: Not on file   Years of education: Not on file   Highest education level: Not on file  Occupational History   Not on file   Tobacco Use   Smoking status: Every Day    Packs/day: 0.50    Years: 20.00    Pack years: 10.00    Types: Cigarettes   Smokeless tobacco: Never  Substance and Sexual Activity   Alcohol use: Not Currently   Drug use: Never   Sexual activity: Not Currently  Other Topics Concern   Not on file  Social History Narrative   Retired Engineer, structural   Son- lives in [place]   Daughter lives locally   5 grandchildren and 1 great grandson   Enjoys spending time with her daughter and relaxing.    2 dogs   Single   Completed HS   Social Determinants of Health   Financial Resource Strain: Low Risk    Difficulty of Paying Living Expenses: Not hard at all  Food Insecurity: No Food Insecurity   Worried About Charity fundraiser in the Last Year: Never true   Ran Out of Food in the Last Year: Never true  Transportation Needs: No Transportation Needs   Lack of Transportation (Medical): No   Lack of Transportation (Non-Medical): No  Physical Activity: Inactive   Days of Exercise per Week: 0 days   Minutes of Exercise per Session:  0 min  Stress: No Stress Concern Present   Feeling of Stress : Not at all  Social Connections: Socially Isolated   Frequency of Communication with Friends and Family: More than three times a week   Frequency of Social Gatherings with Friends and Family: More than three times a week   Attends Religious Services: Never   Marine scientist or Organizations: No   Attends Music therapist: Never   Marital Status: Divorced  Human resources officer Violence: Not At Risk   Fear of Current or Ex-Partner: No   Emotionally Abused: No   Physically Abused: No   Sexually Abused: No    Outpatient Medications Prior to Visit  Medication Sig Dispense Refill   aspirin 325 MG tablet Take by mouth daily.     atorvastatin (LIPITOR) 80 MG tablet Take 1 tablet (80 mg total) by mouth daily. 90 tablet 1   hydrochlorothiazide (HYDRODIURIL) 25 MG tablet Take 1 tablet  (25 mg total) by mouth daily. 90 tablet 1   lisinopril (ZESTRIL) 20 MG tablet Take 1 tablet (20 mg total) by mouth daily. 90 tablet 1   varenicline (CHANTIX PAK) 0.5 MG X 11 & 1 MG X 42 tablet Take one 0.5 mg tablet by mouth once daily for 3 days, then increase to one 0.5 mg tablet twice daily for 4 days, then increase to one 1 mg tablet twice daily. 53 tablet 0   vitamin B-12 (CYANOCOBALAMIN) 1000 MCG tablet Take 1 tablet (1,000 mcg total) by mouth daily.     cephALEXin (KEFLEX) 500 MG capsule Take 1 capsule (500 mg total) by mouth 4 (four) times daily. 20 capsule 0   VENTOLIN HFA 108 (90 Base) MCG/ACT inhaler INHALE 2 PUFFS BY MOUTH EVERY 6 HOURS AS NEEDED FOR WHEEZING FOR SHORTNESS OF BREATH 18 g 0   pantoprazole (PROTONIX) 40 MG tablet Take 1 tablet (40 mg total) by mouth daily. 90 tablet 1   potassium chloride SA (KLOR-CON M) 20 MEQ tablet Take by mouth.     Zoster Vaccine Adjuvanted [facility]) injection Inject 0.5mg  IM now and again in 2-6 months. 0.5 mL 1   No facility-administered medications prior to visit.    Allergies  Allergen Reactions   Augmentin [Amoxicillin-Pot Clavulanate] Hives and Itching   Keflex [Cephalexin]     itching   Morphine And Related Nausea And Vomiting   Sulfa Antibiotics Hives    Review of Systems  Constitutional:  Positive for malaise/fatigue. Negative for fever.  HENT:  Negative for ear pain and hearing loss.        (-)nystagmus (-)adenopathy  Eyes:  Negative for blurred vision.  Respiratory:  Positive for cough, shortness of breath and wheezing.        (+) chest tightness  Cardiovascular:  Negative for chest pain and leg swelling.  Gastrointestinal:  Negative for blood in stool, diarrhea, nausea and vomiting.  Genitourinary:  Negative for dysuria, flank pain and frequency.  Musculoskeletal:  Negative for joint pain and myalgias.  Skin:  Negative for rash.  Neurological:  Negative for headaches.  Psychiatric/Behavioral:  Negative for depression.  The patient is not nervous/anxious.       Objective:    Physical Exam Constitutional:      General: She is not in acute distress.    Appearance: Normal appearance. She is not ill-appearing.  HENT:     Head: Normocephalic and atraumatic.     Right Ear: External ear normal.     Left Ear: External  ear normal.  Eyes:     Extraocular Movements: Extraocular movements intact.     Pupils: Pupils are equal, round, and reactive to light.  Cardiovascular:     Rate and Rhythm: Normal rate and regular rhythm.     Pulses: Normal pulses.     Heart sounds: Normal heart sounds. No murmur heard. Pulmonary:     Effort: Pulmonary effort is normal. No respiratory distress.     Breath sounds: Wheezing present. No rhonchi.  Abdominal:     General: Bowel sounds are normal. There is no distension.     Palpations: Abdomen is soft.     Tenderness: There is no abdominal tenderness. There is no guarding or rebound.  Musculoskeletal:     Cervical back: Neck supple.  Lymphadenopathy:     Cervical: No cervical adenopathy.  Skin:    General: Skin is warm and dry.  Neurological:     Mental Status: She is alert and oriented to person, place, and time.  Psychiatric:        Behavior: Behavior normal.        Judgment: Judgment normal.    BP 116/68 (BP Location: Right Arm, Patient Position: Sitting, Cuff Size: Small)    Pulse (!) 102    Temp 98.5 F (36.9 C) (Oral)    Resp 16    Wt 148 lb (67.1 kg)    SpO2 99%    BMI 24.63 kg/m  Wt Readings from Last 3 Encounters:  06/21/21 148 lb (67.1 kg)  05/19/21 148 lb (67.1 kg)  05/06/21 160 lb (72.6 kg)    Diabetic Foot Exam - Simple   No data filed    Lab Results  Component Value Date   WBC 6.2 05/19/2021   HGB 14.5 05/19/2021   HCT 41.4 05/19/2021   PLT 284 05/19/2021   GLUCOSE 96 05/19/2021   CHOL 147 09/02/2020   TRIG 106.0 09/02/2020   HDL 49.30 09/02/2020   LDLCALC 76 09/02/2020   ALT 23 05/19/2021   AST 25 05/19/2021   NA 136 05/19/2021   K  4.1 05/19/2021   CL 101 05/19/2021   CREATININE 0.89 05/19/2021   BUN 11 05/19/2021   CO2 25 05/19/2021   TSH 2.38 05/19/2021    Lab Results  Component Value Date   TSH 2.38 05/19/2021   Lab Results  Component Value Date   WBC 6.2 05/19/2021   HGB 14.5 05/19/2021   HCT 41.4 05/19/2021   MCV 89.6 05/19/2021   PLT 284 05/19/2021   Lab Results  Component Value Date   NA 136 05/19/2021   K 4.1 05/19/2021   CO2 25 05/19/2021   GLUCOSE 96 05/19/2021   BUN 11 05/19/2021   CREATININE 0.89 05/19/2021   BILITOT 0.7 05/19/2021   ALKPHOS 112 09/02/2020   AST 25 05/19/2021   ALT 23 05/19/2021   PROT 6.7 05/19/2021   ALBUMIN 4.1 09/02/2020   CALCIUM 9.5 05/19/2021   ANIONGAP 9 12/12/2017   GFR 89.30 03/06/2021   Lab Results  Component Value Date   CHOL 147 09/02/2020   Lab Results  Component Value Date   HDL 49.30 09/02/2020   Lab Results  Component Value Date   LDLCALC 76 09/02/2020   Lab Results  Component Value Date   TRIG 106.0 09/02/2020   Lab Results  Component Value Date   CHOLHDL 3 09/02/2020   No results found for: HGBA1C     Assessment & Plan:   Problem List Items Addressed This  Visit       Unprioritized   COPD with acute exacerbation ([place])    New. Solumedrol 125mg  IM given in office today. To be followed by prednisone taper. Obtain cxr to rule out pna. Discussed smoking cessation. Offered pulmonology referral but she declines at this time.       Relevant Medications   albuterol (VENTOLIN HFA) 108 (90 Base) MCG/ACT inhaler   predniSONE (DELTASONE) 10 MG tablet   chlorpheniramine-HYDROcodone (TUSSIONEX PENNKINETIC ER) 10-8 MG/5ML   Other Relevant Orders   DG Chest 2 View   Acute cystitis without hematuria - Primary    Advised her to d/c keflex due to associated itching. Will repeat urine culture to ensure eradication of UTI.        Relevant Orders   Urine Culture    Meds ordered this encounter  Medications   albuterol (VENTOLIN HFA)  108 (90 Base) MCG/ACT inhaler    Sig: Inhale 2 puffs into the lungs every 6 (six) hours as needed for wheezing or shortness of breath.    Dispense:  8 g    Refill:  0    Order Specific Question:   Supervising Provider    Answer:   Penni Homans A [[id number]]   predniSONE (DELTASONE) 10 MG tablet    Sig: 4 tabs by mouth once daily for 2 days, then 3 tabs daily x 2 days, then 2 tabs daily x 2 days, then 1 tab daily x 2 days    Dispense:  20 tablet    Refill:  0    Order Specific Question:   Supervising Provider    Answer:   Penni Homans A [[id number]]   chlorpheniramine-HYDROcodone (TUSSIONEX PENNKINETIC ER) 10-8 MG/5ML    Sig: Take 5 mLs by mouth every 12 (twelve) hours as needed for cough.    Dispense:  50 mL    Refill:  0    Order Specific Question:   Supervising Provider    Answer:   Penni Homans A [[id number]]    I,Zite Okoli,acting as a scribe for Nance Pear, NP.,have documented all relevant documentation on the behalf of Nance Pear, NP,as directed by  Nance Pear, NP while in the presence of Nance Pear, NP.   I, [name], NP , personally preformed the services described in this documentation.  All medical record entries made by the scribe were at my direction and in my presence.  I have reviewed the chart and discharge instructions (if applicable) and agree that the record reflects my personal performance and is accurate and complete. 06/21/2021

## 2021-06-21 NOTE — Patient Instructions (Addendum)
Please complete urine in the lab. Complete chest x-ray in imaging on the first floor. Start prednisone tonight.  Do not drive after taking Tussionex. Call if symptoms worsen or if symptoms do not improve in 3-4 days.

## 2021-06-21 NOTE — Assessment & Plan Note (Signed)
New. Solumedrol 125mg  IM given in office today. To be followed by prednisone taper. Obtain cxr to rule out pna. Discussed smoking cessation. Offered pulmonology referral but she declines at this time.

## 2021-06-21 NOTE — Addendum Note (Signed)
Addended by: Kelle Darting A on: 06/21/2021 04:06 PM   Modules accepted: Orders

## 2021-06-21 NOTE — Assessment & Plan Note (Signed)
Advised her to d/c keflex due to associated itching. Will repeat urine culture to ensure eradication of UTI.

## 2021-06-22 ENCOUNTER — Other Ambulatory Visit: Payer: Medicaid Other

## 2021-06-22 DIAGNOSIS — N3000 Acute cystitis without hematuria: Secondary | ICD-10-CM

## 2021-06-24 LAB — URINE CULTURE
MICRO NUMBER:: 12987168
SPECIMEN QUALITY:: ADEQUATE

## 2021-06-25 ENCOUNTER — Telehealth: Payer: Self-pay | Admitting: Family

## 2021-06-25 DIAGNOSIS — N390 Urinary tract infection, site not specified: Secondary | ICD-10-CM

## 2021-06-25 MED ORDER — CIPROFLOXACIN HCL 500 MG PO TABS: 500.0000 mg | ORAL_TABLET | Freq: Two times a day (BID) | ORAL | 0 refills | Status: AC

## 2021-06-25 MED ORDER — NITROFURANTOIN MONOHYD MACRO 100 MG PO CAPS: 100.0000 mg | ORAL_CAPSULE | Freq: Two times a day (BID) | ORAL | 0 refills | Status: AC

## 2021-06-25 NOTE — Telephone Encounter (Signed)
Please verify that pt has read the mychart message and if not, please call pt.

## 2021-06-25 NOTE — Telephone Encounter (Signed)
Opened in error

## 2021-06-29 ENCOUNTER — Other Ambulatory Visit: Payer: Medicaid Other

## 2021-06-29 ENCOUNTER — Encounter: Payer: Self-pay | Admitting: Family

## 2021-07-03 ENCOUNTER — Ambulatory Visit: Payer: Medicaid Other | Admitting: Orthopedic Surgery

## 2021-07-05 ENCOUNTER — Other Ambulatory Visit: Payer: Medicaid Other

## 2021-07-05 ENCOUNTER — Encounter: Payer: Self-pay | Admitting: Family

## 2021-07-05 DIAGNOSIS — N390 Urinary tract infection, site not specified: Secondary | ICD-10-CM

## 2021-07-10 ENCOUNTER — Telehealth: Payer: Self-pay

## 2021-07-10 NOTE — Telephone Encounter (Signed)
Transition Care Management Unsuccessful Follow-up Telephone Call  Date of discharge and from where:  07/08/2021-[facility]  Attempts:  1st Attempt  Reason for unsuccessful TCM follow-up call:  No answer/busy

## 2021-07-10 NOTE — Telephone Encounter (Signed)
Pt returning phone call back to office

## 2021-07-11 ENCOUNTER — Encounter: Payer: Self-pay | Admitting: Family

## 2021-07-11 NOTE — Telephone Encounter (Signed)
Transition Care Management Follow-up Telephone Call Date of discharge and from where: 07/08/2021-[facility] How have you been since you were released from the hospital? Patient states-Doing ok but meds have got me jacked up.No energy but I am getting around ok. Any questions or concerns? No  Items Reviewed: Did the pt receive and understand the discharge instructions provided? Yes  Medications obtained and verified? Yes  Other? Yes  Any new allergies since your discharge? No  Dietary orders reviewed? Yes Do you have support at home? Yes   Home Care and Equipment/Supplies: Were home health services ordered? no If so, what is the name of the agency? N/a  Has the agency set up a time to come to the patient's home? not applicable Were any new equipment or medical supplies ordered?  No What is the name of the medical supply agency? N/a Were you able to get the supplies/equipment? not applicable Do you have any questions related to the use of the equipment or supplies? N/a  Functional Questionnaire: (I = Independent and D = Dependent) ADLs: I  Bathing/Dressing- I  Meal Prep- I  Eating- I  Maintaining continence- I  Transferring/Ambulation- I  Managing Meds- I  Follow up appointments reviewed:  PCP Hospital f/u appt confirmed? Yes  Scheduled to see [name] on 07/18/2021 @ 1:20. [facility] f/u appt confirmed? No  Patient to schedule with urology Are transportation arrangements needed? No  If their condition worsens, is the pt aware to call PCP or go to the Emergency Dept.? Yes Was the patient provided with contact information for the PCP's office or ED? Yes Was to pt encouraged to call back with questions or concerns? Yes

## 2021-07-15 ENCOUNTER — Encounter: Payer: Self-pay | Admitting: Family

## 2021-07-17 ENCOUNTER — Encounter: Payer: Self-pay | Admitting: Family

## 2021-07-18 ENCOUNTER — Ambulatory Visit (INDEPENDENT_AMBULATORY_CARE_PROVIDER_SITE_OTHER): Payer: Medicaid Other | Admitting: Family

## 2021-07-18 VITALS — BP 126/72 | HR 109 | Temp 98.4°F | Resp 16 | Wt 138.0 lb

## 2021-07-18 DIAGNOSIS — N289 Disorder of kidney and ureter, unspecified: Secondary | ICD-10-CM

## 2021-07-18 DIAGNOSIS — I10 Essential (primary) hypertension: Secondary | ICD-10-CM | POA: Diagnosis not present

## 2021-07-18 DIAGNOSIS — N390 Urinary tract infection, site not specified: Secondary | ICD-10-CM | POA: Diagnosis not present

## 2021-07-18 DIAGNOSIS — N3000 Acute cystitis without hematuria: Secondary | ICD-10-CM

## 2021-07-18 LAB — CBC WITH DIFFERENTIAL/PLATELET
Basophils Absolute: 0 10*3/uL (ref 0.0–0.1)
Basophils Relative: 0.8 % (ref 0.0–3.0)
Eosinophils Absolute: 0.2 10*3/uL (ref 0.0–0.7)
Eosinophils Relative: 3.9 % (ref 0.0–5.0)
HCT: 35.8 % — ABNORMAL LOW (ref 36.0–46.0)
Hemoglobin: 12.3 g/dL (ref 12.0–15.0)
Lymphocytes Relative: 26 % (ref 12.0–46.0)
Lymphs Abs: 1.6 10*3/uL (ref 0.7–4.0)
MCHC: 34.4 g/dL (ref 30.0–36.0)
MCV: 88.9 fl (ref 78.0–100.0)
Monocytes Absolute: 0.6 10*3/uL (ref 0.1–1.0)
Monocytes Relative: 9.4 % (ref 3.0–12.0)
Neutro Abs: 3.6 10*3/uL (ref 1.4–7.7)
Neutrophils Relative %: 59.9 % (ref 43.0–77.0)
Platelets: 416 10*3/uL — ABNORMAL HIGH (ref 150.0–400.0)
RBC: 4.03 Mil/uL (ref 3.87–5.11)
RDW: 13.6 % (ref 11.5–15.5)
WBC: 6 10*3/uL (ref 4.0–10.5)

## 2021-07-18 LAB — COMPREHENSIVE METABOLIC PANEL
ALT: 32 U/L (ref 0–35)
AST: 42 U/L — ABNORMAL HIGH (ref 0–37)
Albumin: 3.3 g/dL — ABNORMAL LOW (ref 3.5–5.2)
Alkaline Phosphatase: 126 U/L — ABNORMAL HIGH (ref 39–117)
BUN: 13 mg/dL (ref 6–23)
CO2: 29 mEq/L (ref 19–32)
Calcium: 8.6 mg/dL (ref 8.4–10.5)
Chloride: 95 mEq/L — ABNORMAL LOW (ref 96–112)
Creatinine, Ser: 0.84 mg/dL (ref 0.40–1.20)
GFR: 70.3 mL/min (ref >60.00)
Glucose, Bld: 88 mg/dL (ref 70–99)
Potassium: 3.6 mEq/L (ref 3.5–5.1)
Sodium: 134 mEq/L — ABNORMAL LOW (ref 135–145)
Total Bilirubin: 0.6 mg/dL (ref 0.2–1.2)
Total Protein: 6.3 g/dL (ref 6.0–8.3)

## 2021-07-18 NOTE — Patient Instructions (Addendum)
Please complete lab work prior to leaving.  ?Please call [facility] to schedule an appointment.  ?

## 2021-07-18 NOTE — Progress Notes (Signed)
Subjective:   By signing my name below, I, Dawn Lowery, attest that this documentation has been prepared under the direction and in the presence of Dawn Lowery, 07/18/2021   Patient ID: Dawn Lowery, female    DOB: 23-Jul-1950, 71 y.o.   MRN: [id number]  Chief Complaint  Patient presents with   Follow-up    Here for hospital follow up   Fatigue    Complains of feeling fatigue    HPI Patient is in today for a hospital follow up. Discharge summary is reviewed in Care Everywhere.   UTI - Patient was admitted to [facility] on 07/06/2021 for a UTI. Per discharge summary, urine culture grew E. Coli. Patient was discharged on 07/15/2021. Patient denies burning while urinating and blood in the urine. She reports that her urine is a light yellow and there is an absence of smell.    Nitrofurantoin/ciprofloxacin HCl -  Patient discontinued use of nitrofurantoin and ciprofloxacin HCl. She states that the medications made her feel as if her lungs were tight.   Diet - Patient has not been eating much. Her weight has also decreased. She states that foods have not tasted like it used to. She was recommended to drink Ensure but does not like the taste of it. She has added yogurt and probiotic drinks to her diet.  Wt Readings from Last 3 Encounters:  07/18/21 138 lb (62.6 kg)  06/21/21 148 lb (67.1 kg)  05/19/21 148 lb (67.1 kg)   Fatigue - Patient has been very fatigued since her admission. She thinks this could be due to her lack of eating.  Blood Pressure - At time of discharge lisinopril and hctz were discontinued due to renal insufficiency. HCTZ was since restarted due to increasing LE edema. She remains off of lisinopril.   BP Readings from Last 3 Encounters:  07/18/21 126/72  06/21/21 116/68  05/19/21 135/88     Health Maintenance Due  Topic Date Due   COLONOSCOPY (Pts 45-43yrs Insurance coverage will need to be confirmed)  Never done   COVID-19 Vaccine  (3 - Moderna risk series) 03/20/2021   Pneumonia Vaccine 50+ Years old (2 - PCV) 09/02/2021    Past Medical History:  Diagnosis Date   Hypertension     Past Surgical History:  Procedure Laterality Date   ABDOMINAL HYSTERECTOMY     ELBOW SURGERY Right 2011   tennis elbow   FOOT SURGERY Right    big toe- pinning due to fracture   HAND SURGERY Left    thumb surgery    Family History  Problem Relation Age of Onset   Lung cancer Mother    Lung cancer Father    Obesity Sister    Hypertension Sister        Karlene Lineman   Lung cancer Paternal Grandfather     Social History   Socioeconomic History   Marital status: Divorced    Spouse name: Not on file   Number of children: Not on file   Years of education: Not on file   Highest education level: Not on file  Occupational History   Not on file  Tobacco Use   Smoking status: Every Day    Packs/day: 0.50    Years: 20.00    Pack years: 10.00    Types: Cigarettes   Smokeless tobacco: Never  Substance and Sexual Activity   Alcohol use: Not Currently   Drug use: Never   Sexual activity: Not Currently  Other Topics Concern   Not on file  Social History Narrative   Retired Engineer, structural   Son- lives in [place]   Daughter lives locally   5 grandchildren and 1 great grandson   Enjoys spending time with her daughter and relaxing.    2 dogs   Single   Completed HS   Social Determinants of Health   Financial Resource Strain: Low Risk    Difficulty of Paying Living Expenses: Not hard at all  Food Insecurity: No Food Insecurity   Worried About Charity fundraiser in the Last Year: Never true   Ran Out of Food in the Last Year: Never true  Transportation Needs: No Transportation Needs   Lack of Transportation (Medical): No   Lack of Transportation (Non-Medical): No  Physical Activity: Inactive   Days of Exercise per Week: 0 days   Minutes of Exercise per Session: 0 min  Stress: No Stress Concern Present    Feeling of Stress : Not at all  Social Connections: Socially Isolated   Frequency of Communication with Friends and Family: More than three times a week   Frequency of Social Gatherings with Friends and Family: More than three times a week   Attends Religious Services: Never   Marine scientist or Organizations: No   Attends Music therapist: Never   Marital Status: Divorced  Human resources officer Violence: Not At Risk   Fear of Current or Ex-Partner: No   Emotionally Abused: No   Physically Abused: No   Sexually Abused: No    Outpatient Medications Prior to Visit  Medication Sig Dispense Refill   albuterol (VENTOLIN HFA) 108 (90 Base) MCG/ACT inhaler Inhale 2 puffs into the lungs every 6 (six) hours as needed for wheezing or shortness of breath. 8 g 0   aspirin 325 MG tablet Take by mouth daily.     atorvastatin (LIPITOR) 80 MG tablet Take 1 tablet (80 mg total) by mouth daily. 90 tablet 1   chlorpheniramine-HYDROcodone (TUSSIONEX PENNKINETIC ER) 10-8 MG/5ML Take 5 mLs by mouth every 12 (twelve) hours as needed for cough. 50 mL 0   hydrochlorothiazide (HYDRODIURIL) 25 MG tablet Take 1 tablet (25 mg total) by mouth daily. 90 tablet 1   varenicline (CHANTIX PAK) 0.5 MG X 11 & 1 MG X 42 tablet Take one 0.5 mg tablet by mouth once daily for 3 days, then increase to one 0.5 mg tablet twice daily for 4 days, then increase to one 1 mg tablet twice daily. 53 tablet 0   vitamin B-12 (CYANOCOBALAMIN) 1000 MCG tablet Take 1 tablet (1,000 mcg total) by mouth daily.     lisinopril (ZESTRIL) 20 MG tablet Take 1 tablet (20 mg total) by mouth daily. 90 tablet 1   predniSONE (DELTASONE) 10 MG tablet 4 tabs by mouth once daily for 2 days, then 3 tabs daily x 2 days, then 2 tabs daily x 2 days, then 1 tab daily x 2 days 20 tablet 0   No facility-administered medications prior to visit.    Allergies  Allergen Reactions   Augmentin [Amoxicillin-Pot Clavulanate] Hives and Itching    Ciprofloxacin     ? Chest tightness   Keflex [Cephalexin]     itching   Macrobid [Nitrofurantoin]     ? Chest tightness   Morphine And Related Nausea And Vomiting   Sulfa Antibiotics Hives    Review of Systems  Genitourinary:  Negative for dysuria and hematuria.  Objective:    Physical Exam Constitutional:      General: She is not in acute distress.    Appearance: Normal appearance. She is not ill-appearing.  HENT:     Head: Normocephalic and atraumatic.     Right Ear: External ear normal.     Left Ear: External ear normal.  Eyes:     Extraocular Movements: Extraocular movements intact.     Pupils: Pupils are equal, round, and reactive to light.  Cardiovascular:     Rate and Rhythm: Regular rhythm. Tachycardia present.     Heart sounds: Normal heart sounds. No murmur heard.   No gallop.  Pulmonary:     Effort: Pulmonary effort is normal. No respiratory distress.     Breath sounds: Normal breath sounds. No wheezing or rales.  Skin:    General: Skin is warm and dry.  Neurological:     Mental Status: She is alert and oriented to person, place, and time.  Psychiatric:        Judgment: Judgment normal.    BP 126/72 (BP Location: Right Arm, Patient Position: Sitting, Cuff Size: Small)    Pulse (!) 109    Temp 98.4 F (36.9 C) (Oral)    Resp 16    Wt 138 lb (62.6 kg)    SpO2 98%    BMI 22.96 kg/m  Wt Readings from Last 3 Encounters:  07/18/21 138 lb (62.6 kg)  06/21/21 148 lb (67.1 kg)  05/19/21 148 lb (67.1 kg)       Assessment & Plan:   Problem List Items Addressed This Visit       Unprioritized   Essential hypertension    BP stable on hctz only. Will continue off of lisinopril.       Acute cystitis without hematuria    Clinically resolved. She has had recurrent UTI's.  She has not yet scheduled her appointment with Urology. Recommended that she schedule appointment at her earliest convenience. Will reculture to ensure resolution. We discussed that weight  loss and generalized fatigue is common after such an infection/hospitalization and that her energy, weight and appetite should improve in the coming weeks.  If not, she will let me know.       Other Visit Diagnoses     Recurrent UTI    -  Primary   Relevant Orders   Urine Culture   CBC with Differential/Platelet   Renal insufficiency       Relevant Orders   Comp Met (CMET)         No orders of the defined types were placed in this encounter.   I, Nance Pear, Lowery, personally preformed the services described in this documentation.  All medical record entries made by the scribe were at my direction and in my presence.  I have reviewed the chart and discharge instructions (if applicable) and agree that the record reflects my personal performance and is accurate and complete. 07/18/2021  I,Amber Collins,acting as a scribe for Nance Pear, Lowery.,have documented all relevant documentation on the behalf of Nance Pear, Lowery,as directed by  Nance Pear, Lowery while in the presence of Nance Pear, Lowery.    Nance Pear, Lowery

## 2021-07-18 NOTE — Assessment & Plan Note (Signed)
BP stable on hctz only. Will continue off of lisinopril.  ?

## 2021-07-18 NOTE — Assessment & Plan Note (Signed)
Clinically resolved. She has had recurrent UTI's.  She has not yet scheduled her appointment with Urology. Recommended that she schedule appointment at her earliest convenience. Will reculture to ensure resolution. We discussed that weight loss and generalized fatigue is common after such an infection/hospitalization and that her energy, weight and appetite should improve in the coming weeks.  If not, she will let me know.  ?

## 2021-07-18 NOTE — Addendum Note (Signed)
Addended by: Kelle Darting A on: 07/18/2021 03:41 PM ? ? Modules accepted: Orders ? ?

## 2021-07-19 ENCOUNTER — Encounter: Payer: Self-pay | Admitting: Family

## 2021-07-19 ENCOUNTER — Other Ambulatory Visit (INDEPENDENT_AMBULATORY_CARE_PROVIDER_SITE_OTHER): Payer: Medicaid Other

## 2021-07-19 DIAGNOSIS — N390 Urinary tract infection, site not specified: Secondary | ICD-10-CM

## 2021-07-19 LAB — URINALYSIS, ROUTINE W REFLEX MICROSCOPIC
Bilirubin Urine: NEGATIVE
Hgb urine dipstick: NEGATIVE
Ketones, ur: NEGATIVE
Leukocytes,Ua: NEGATIVE
Nitrite: NEGATIVE
RBC / HPF: NONE SEEN (ref 0–?)
Specific Gravity, Urine: 1.01 (ref 1.000–1.030)
Urine Glucose: NEGATIVE
Urobilinogen, UA: 0.2 (ref 0.0–1.0)
pH: 6 (ref 5.0–8.0)

## 2021-07-19 MED ORDER — POTASSIUM CHLORIDE CRYS ER 20 MEQ PO TBCR: 20.0000 meq | EXTENDED_RELEASE_TABLET | Freq: Every day | ORAL | 1 refills | Status: DC

## 2021-07-19 NOTE — Progress Notes (Signed)
No charge. Put unable to void yesterday and returned sample today. ?

## 2021-07-21 ENCOUNTER — Ambulatory Visit
Admission: RE | Admit: 2021-07-21 | Discharge: 2021-07-21 | Disposition: A | Discharge status: Home / Self Care | Payer: Medicaid Other | Source: Ambulatory Visit | Attending: Orthopedic Surgery | Admitting: Orthopedic Surgery

## 2021-07-21 ENCOUNTER — Other Ambulatory Visit: Payer: Self-pay

## 2021-07-21 DIAGNOSIS — M25512 Pain in left shoulder: Secondary | ICD-10-CM

## 2021-07-21 DIAGNOSIS — M79601 Pain in right arm: Secondary | ICD-10-CM

## 2021-07-21 LAB — URINE CULTURE
MICRO NUMBER:: 13104009
SPECIMEN QUALITY:: ADEQUATE

## 2021-07-21 IMAGING — MR MR SHOULDER*L* W/ CM
6 series · 40 of 40 positions shown · IV contrast (agent unspecified)
Comparison: None.

CLINICAL DATA: Left shoulder pain status post fall 1 year ago

EXAM:
MR ARTHROGRAM OF THE LEFT SHOULDER
TECHNIQUE: Multiplanar, multisequence MR imaging of the left shoulder was
performed following the administration of intra-articular contrast.
CONTRAST:  See Injection Documentation.

[Series 3: T1 fat-sat · axial · 4.0mm · 0.31mm/px · z∈[-16,+94]mm · 6 of 23 slices shown (1 of 4)]
[im 1/23]
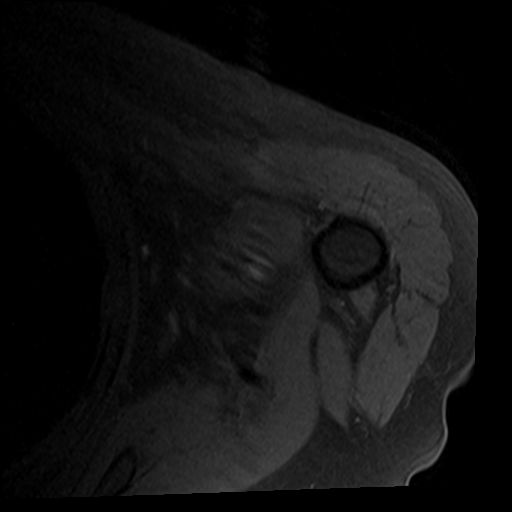
[im 5/23]
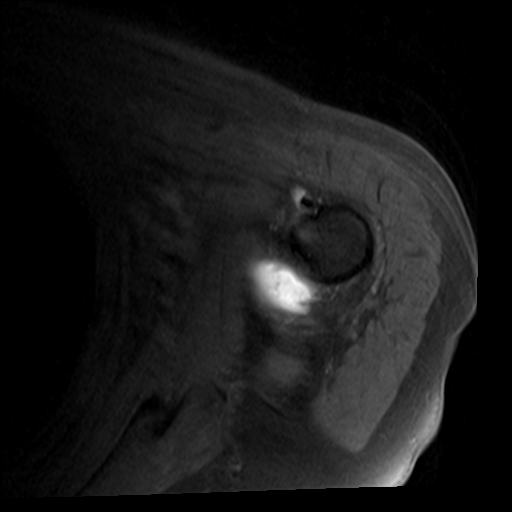
[im 9/23]
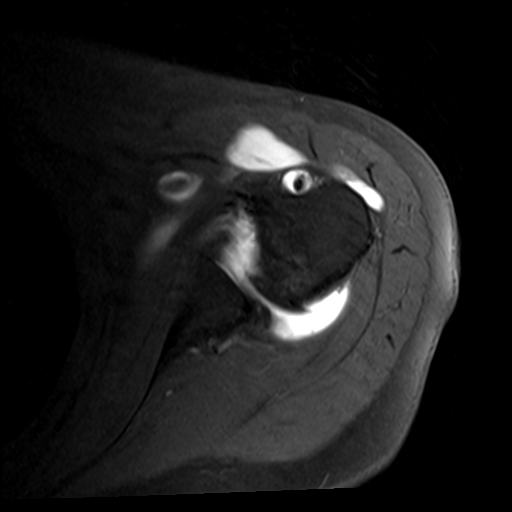
[im 14/23]
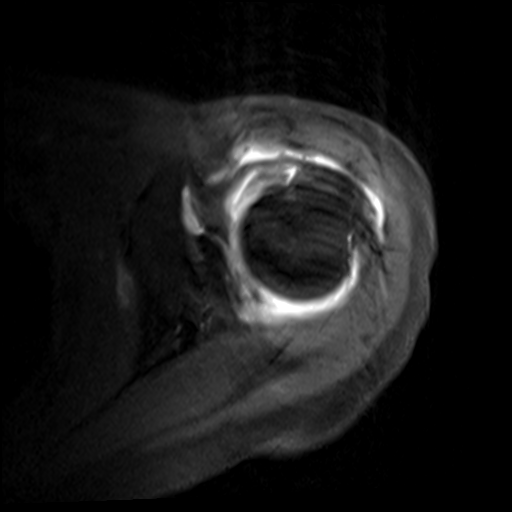
[im 18/23]
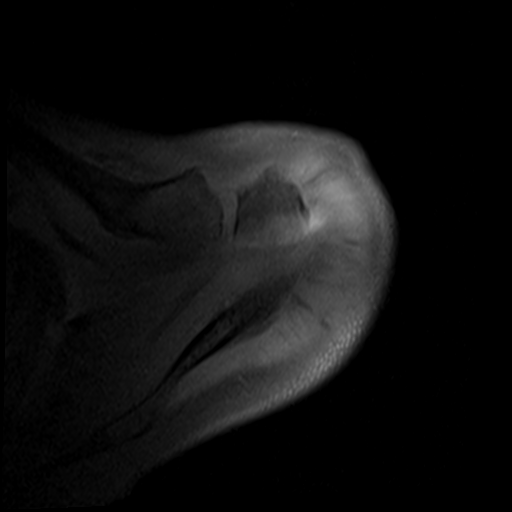
[im 23/23]
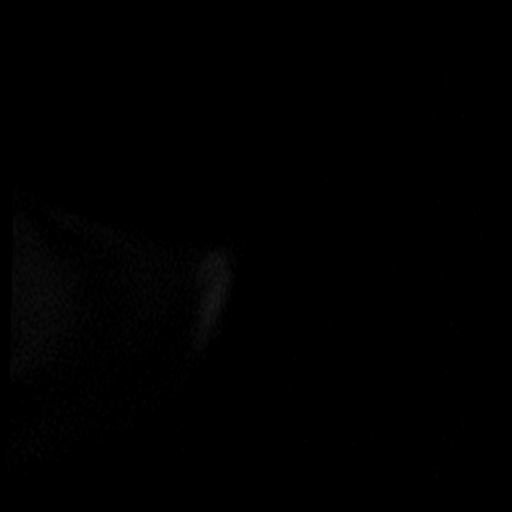

[Series 4: T2 fat-sat · oblique · 4.0mm · 0.59mm/px · 6 of 23 slices shown (1 of 2)]
[im 1/23]
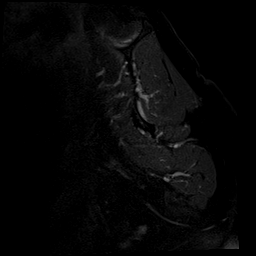
[im 5/23]
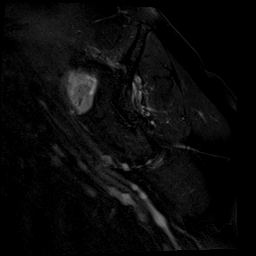
[im 9/23]
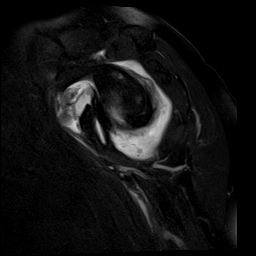
[im 14/23]
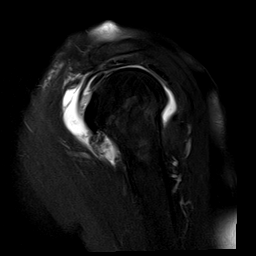
[im 18/23]
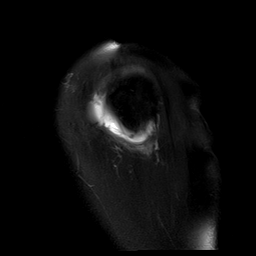
[im 23/23]
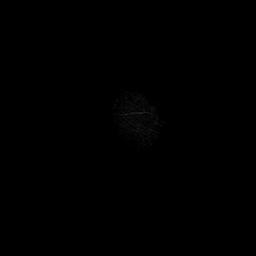

[Series 5: T1 fat-sat · oblique · 4.0mm · 0.59mm/px · 7 of 22 slices shown (2 of 4)]
[im 1/22]
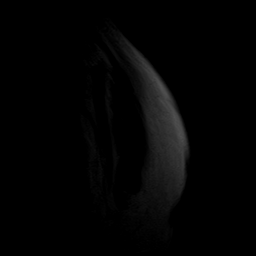
[im 4/22]
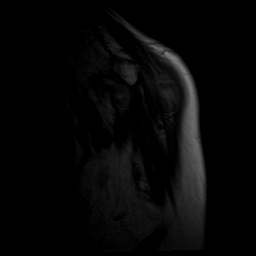
[im 8/22]
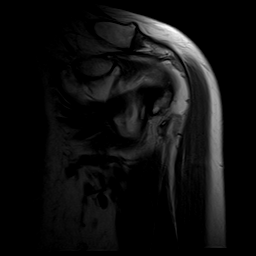
[im 11/22]
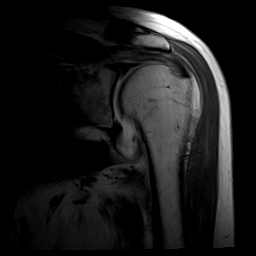
[im 15/22]
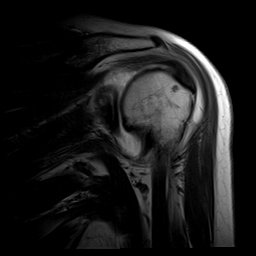
[im 18/22]
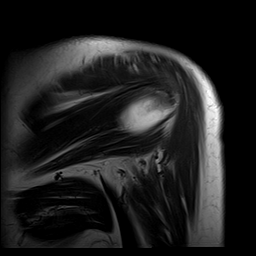
[im 22/22]
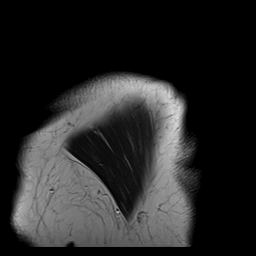

[Series 6: T1 fat-sat · oblique · 4.0mm · 0.59mm/px · 7 of 22 slices shown (3 of 4)]
[im 1/22]
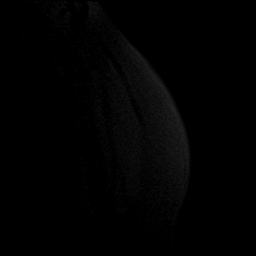
[im 4/22]
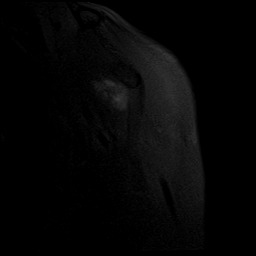
[im 8/22]
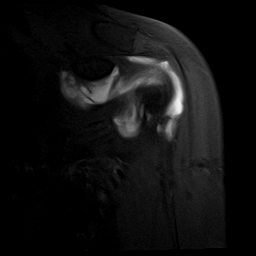
[im 11/22]
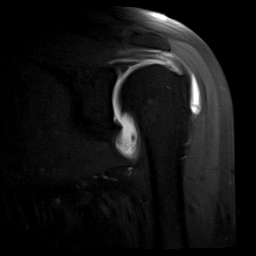
[im 15/22]
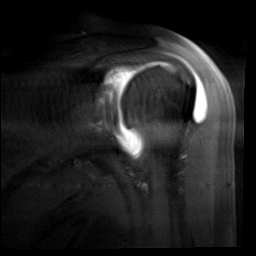
[im 18/22]
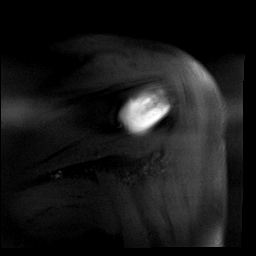
[im 22/22]
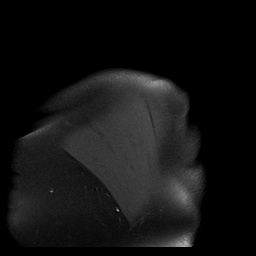

[Series 7: T2 fat-sat · oblique · 4.0mm · 0.59mm/px · 7 of 22 slices shown (2 of 2)]
[im 1/22]
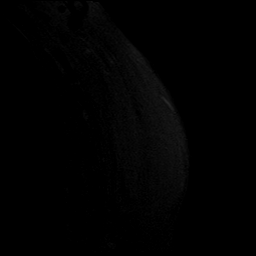
[im 4/22]
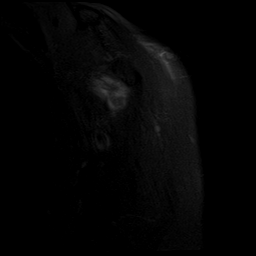
[im 8/22]
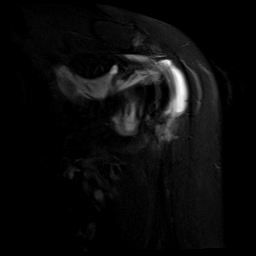
[im 11/22]
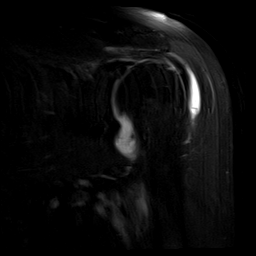
[im 15/22]
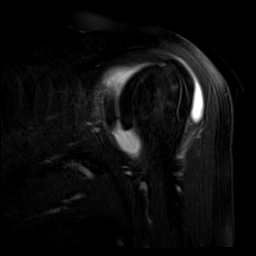
[im 18/22]
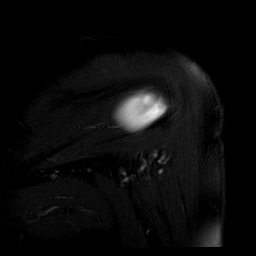
[im 22/22]
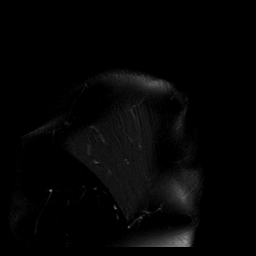

[Series 8: T1 fat-sat · oblique · 4.0mm · 0.59mm/px · 7 of 22 slices shown (4 of 4)]
[im 1/22]
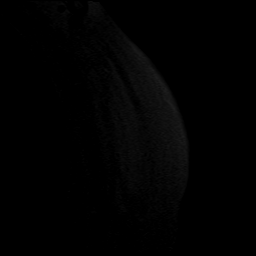
[im 4/22]
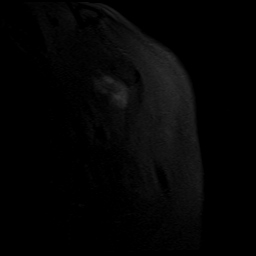
[im 8/22]
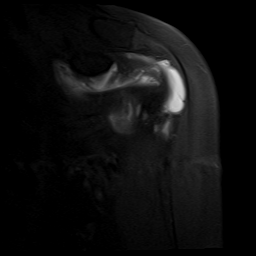
[im 11/22]
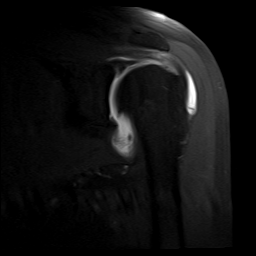
[im 15/22]
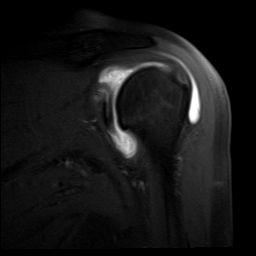
[im 18/22]
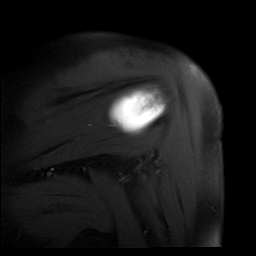
[im 22/22]
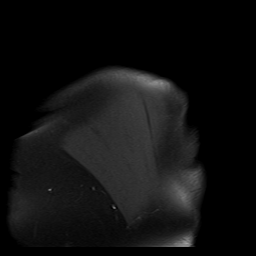

[40 of 40 positions shown; findings below may reference images not displayed]

FINDINGS: Rotator cuff: Mild tendinosis of the supraspinatus tendon with a
high-grade partial-thickness articular surface tear and a small
full-thickness perforation 2.9 cm from the peripheral insertion
(image 13/series 4). Infraspinatus tendon is intact. Teres minor
tendon is intact. Subscapularis tendon is intact.

Muscles: No muscle atrophy or edema. No intramuscular fluid
collection or hematoma.

Biceps Long Head: Mild tendinosis of the intra-articular portion of
the long head of the biceps tendon.

Acromioclavicular Joint: No significant arthropathy of the
acromioclavicular joint. Moderate amount of contrast in the
subacromial/subdeltoid bursa.

Glenohumeral Joint: Intraarticular contrast distending the joint
capsule. Normal glenohumeral ligaments. Partial-thickness cartilage
loss of the glenohumeral joint.

Labrum: Posterosuperior labral tear extending into the remainder of
the posterior labrum.

Bones: No fracture or dislocation. No aggressive osseous lesion.

Other: No fluid collection or hematoma.
IMPRESSION: 1. Mild tendinosis of the supraspinatus tendon with a high-grade
partial-thickness articular surface tear and a small full-thickness
perforation 2.9 cm from the peripheral insertion.
2. Mild tendinosis of the intra-articular portion of the long head
of the biceps tendon.
3. Posterosuperior labral tear extending into the remainder of the
posterior labrum.
4. Partial-thickness cartilage loss of the glenohumeral joint.

## 2021-07-21 IMAGING — XA DG FLUORO GUIDE NDL PLC/BX
1 series · 1 of 1 positions shown · IV contrast (multihance)
Comparison: none

CLINICAL DATA: Left shoulder pain.

EXAM:
LEFT SHOULDER INJECTION UNDER FLUOROSCOPY
TECHNIQUE: An appropriate skin entrance site was determined. The site was
marked, prepped with Betadine, draped in the usual sterile fashion,
and infiltrated locally with 1% lidocaine. A 22 gauge spinal needle
was advanced to the superomedial margin of the humeral head under
intermittent fluoroscopy. 1 mL of 1% lidocaine injected easily. A
mixture of 0.1 mL of MultiHance, 15 mL of Isovue-M 200, and 5 mL of
sterile saline was then used to opacify the left shoulder capsule. A
total of 12 mL of this mixture was injected. No immediate
complication.
FLUOROSCOPY:
Radiation Exposure Index (as provided by the fluoroscopic device):
0.10 mGy Kerma

[Series 1: ortho standard · 1 of 1 slices shown]
[im 1/1]
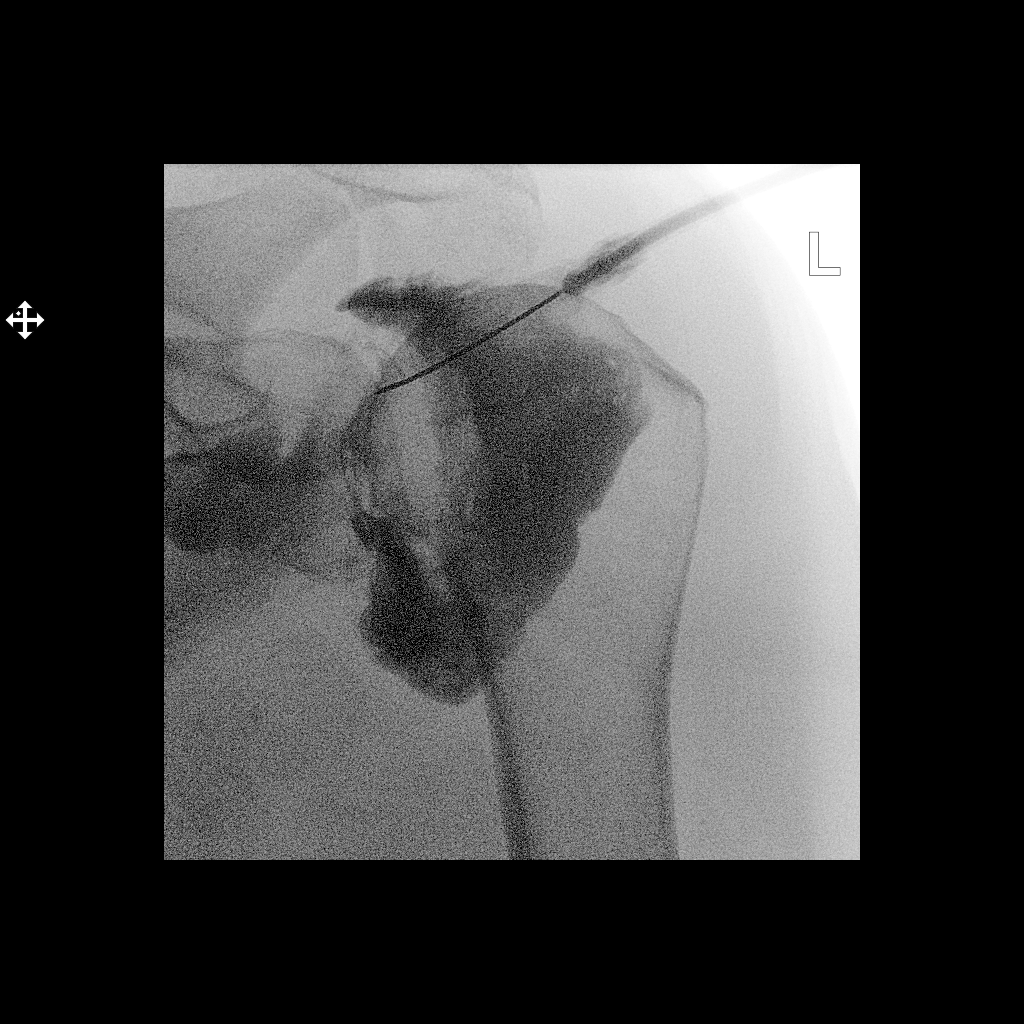

[1 of 1 positions shown; findings below may reference images not displayed]

IMPRESSION: Technically successful left shoulder injection for MRI.

## 2021-07-21 IMAGING — MR MR CERVICAL SPINE W/O CM
4 of 5 series · 27 of 48 positions shown · non-contrast
Comparison: Prior radiograph from [DATE].

CLINICAL DATA: Initial evaluation for bilateral radicular arm pain.

EXAM:
MRI CERVICAL SPINE WITHOUT CONTRAST
TECHNIQUE: Multiplanar, multisequence MR imaging of the cervical spine was
performed. No intravenous contrast was administered.

[Series 2: T2 · sagittal · 3.0mm · 0.82mm/px · 6 of 18 slices shown (1 of 2)]
[im 1/18]
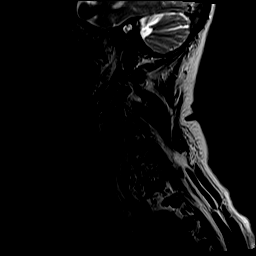
[im 4/18]
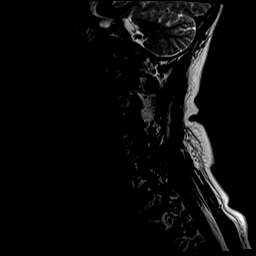
[im 7/18]
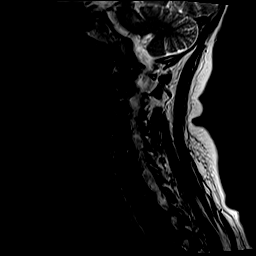
[im 11/18]
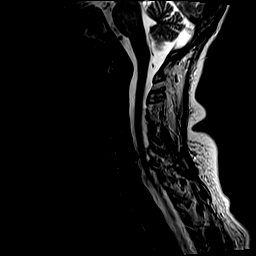
[im 14/18]
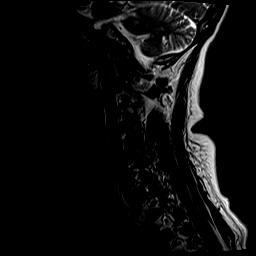
[im 18/18]
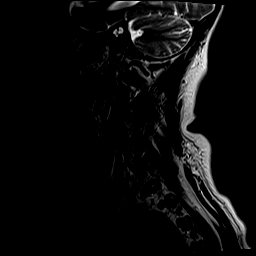

[Series 3: T1 · sagittal · 3.0mm · 0.41mm/px · 7 of 18 slices shown]
[im 1/18]
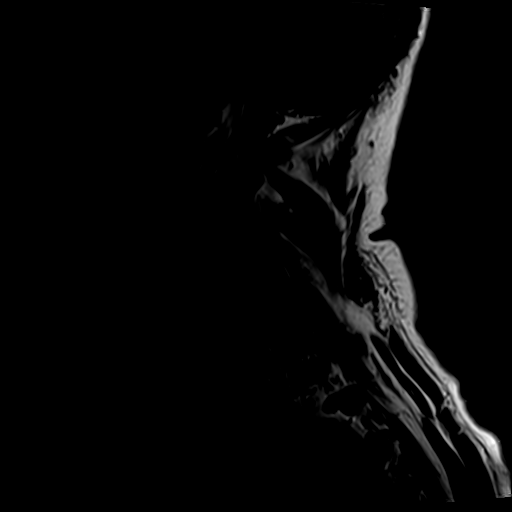
[im 3/18]
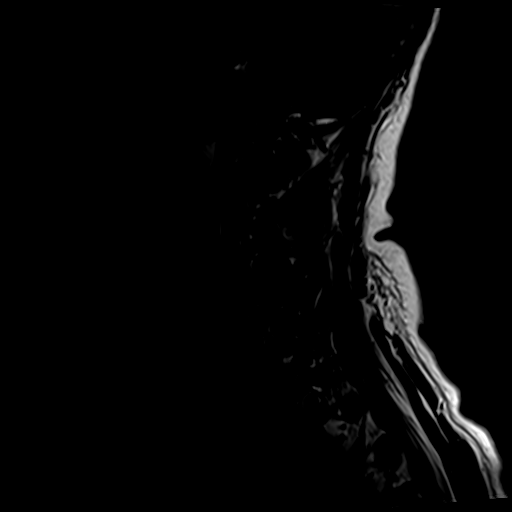
[im 6/18]
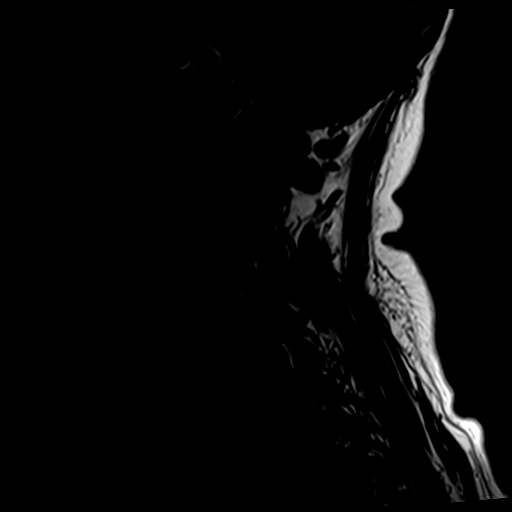
[im 9/18]
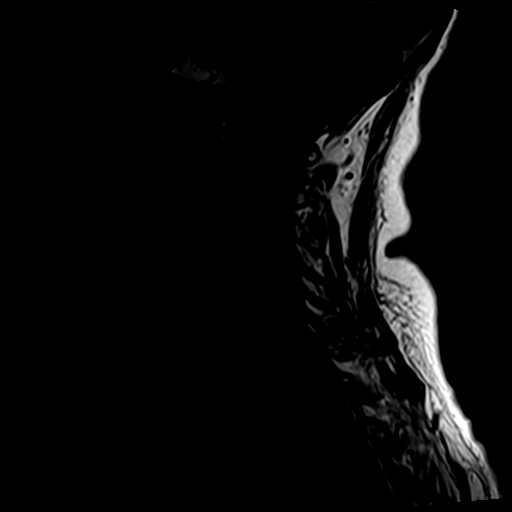
[im 12/18]
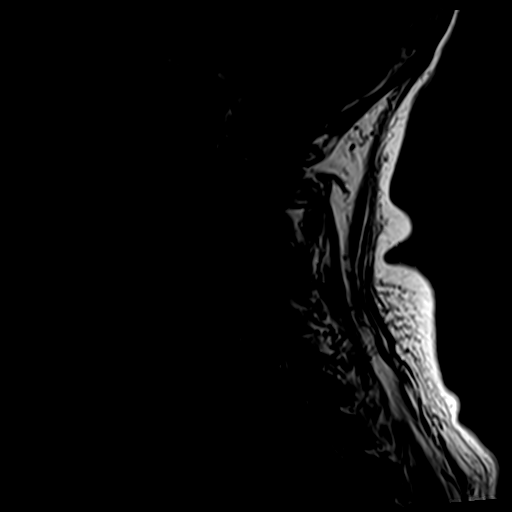
[im 15/18]
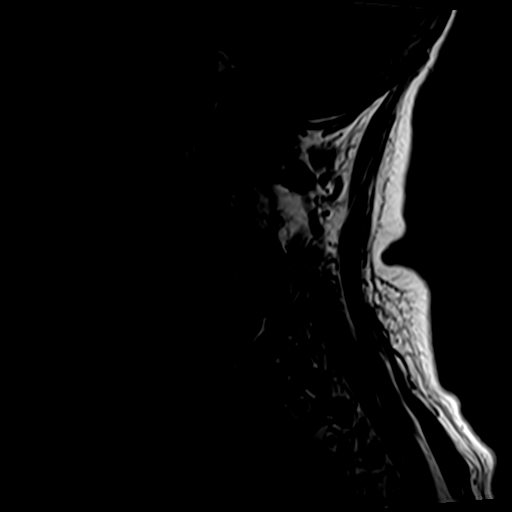
[im 18/18]
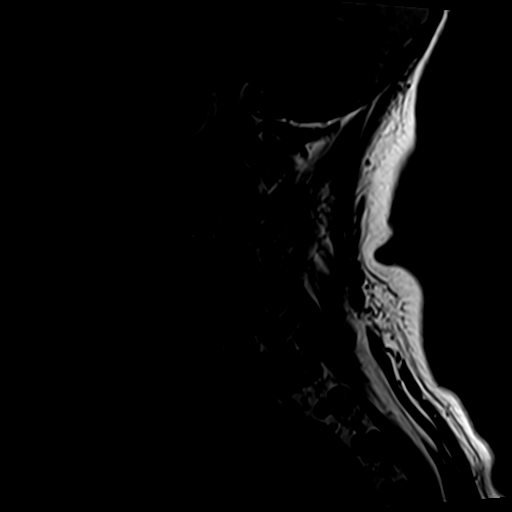

[Series 4: tir sag · sagittal · 3.0mm · 0.41mm/px · 6 of 18 slices shown]
[im 1/18]
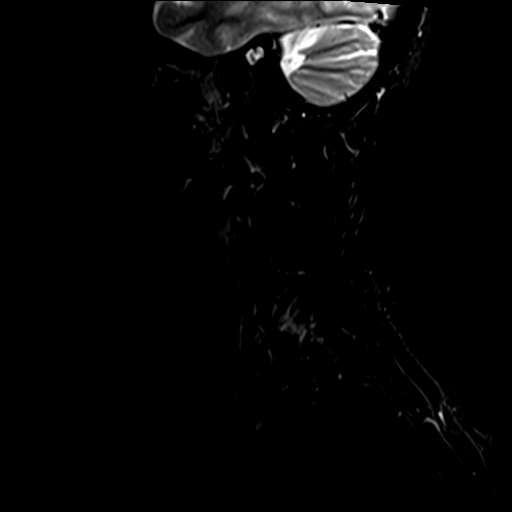
[im 3/18]
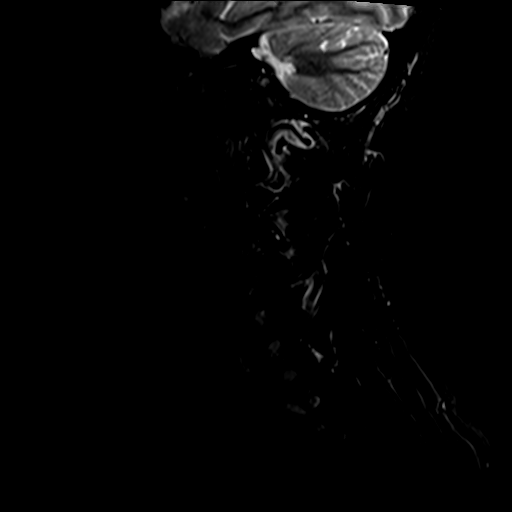
[im 6/18]
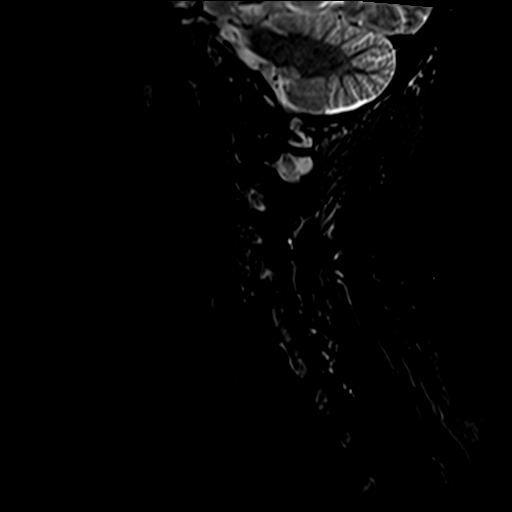
[im 9/18]
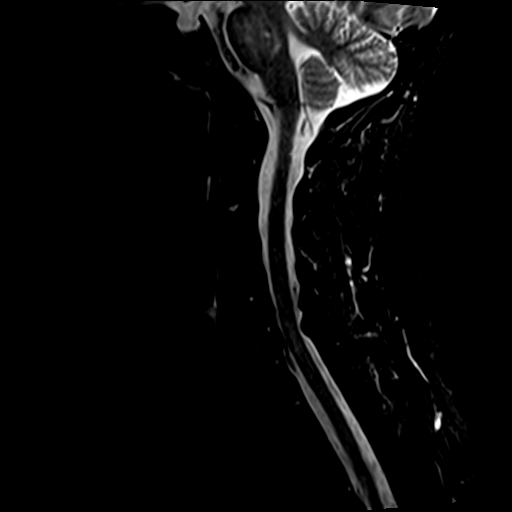
[im 12/18]
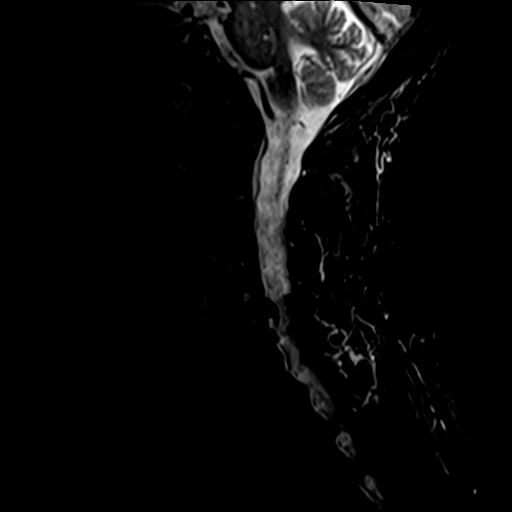
[im 15/18]
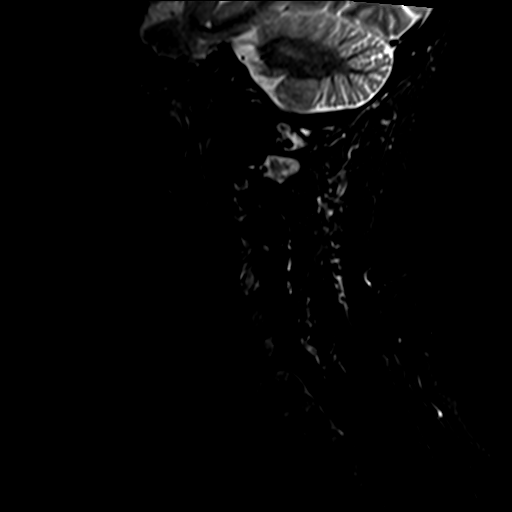

[Series 6: T2 · axial · 3.0mm · 0.78mm/px · z∈[-84,+42]mm · 8 of 36 slices shown (2 of 2)]
[im 1/36]
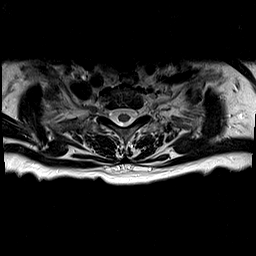
[im 6/36]
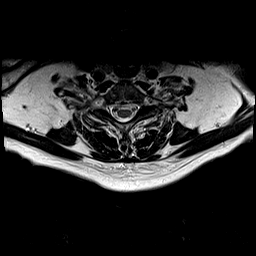
[im 11/36]
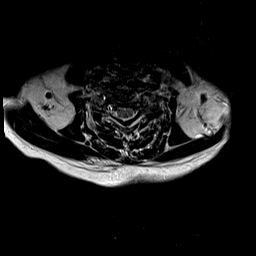
[im 17/36]
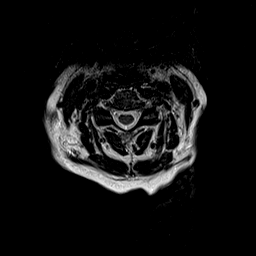
[im 19/36]
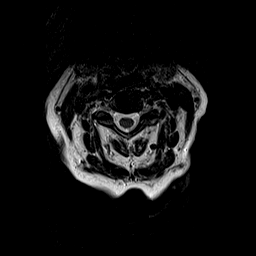
[im 25/36]
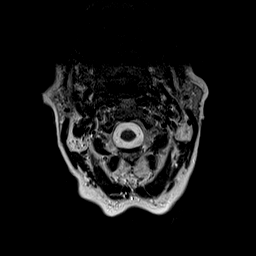
[im 30/36]
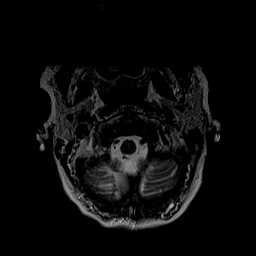
[im 36/36]
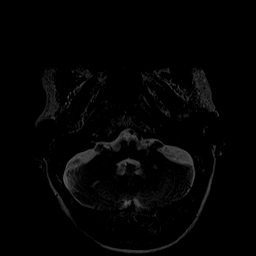

[27 of 48 positions shown; findings below may reference images not displayed]

FINDINGS: Alignment: Mild straightening of the normal cervical lordosis. No
listhesis.

Vertebrae: Vertebral body height maintained without acute or chronic
fracture. Bone marrow signal intensity mildly heterogeneous but
overall within normal limits. No discrete or worrisome osseous
lesions. Mild discogenic reactive endplate change present about the
C5-6 and C6-7 interspaces. No other abnormal marrow edema.

Cord: Normal signal and morphology.

Posterior Fossa, vertebral arteries, paraspinal tissues: Hazy signal
abnormality within the visualized pons most consistent with chronic
microvascular ischemic disease. Few superimposed small remote
lacunar infarcts noted. Few small remote cerebellar infarcts
partially visualized as well. Craniocervical junction normal.
Paraspinous soft tissues normal. Normal flow voids seen within the
vertebral arteries bilaterally.

Disc levels:

C2-C3: Normal interspace. Left-sided facet hypertrophy with
associated ankylosis. No canal or foraminal stenosis.

C3-C4: Disc desiccation with small central disc protrusion (series
5, image 18). Mild left greater than right facet hypertrophy. No
spinal stenosis. Mild left C4 foraminal narrowing. Right neural
foramen remains patent.

C4-C5: Disc desiccation with mild annular bulge. Mild facet
hypertrophy. No spinal stenosis. Foramina remain patent.

C5-C6: Moderate intervertebral disc space narrowing with diffuse
disc osteophyte complex. Flattening and partial effacement of the
ventral thecal sac with resultant mild spinal stenosis. No frank
cord impingement. Severe left with moderate right C6 foraminal
narrowing.

C6-C7: Moderate intervertebral disc space narrowing with diffuse
disc osteophyte complex. Broad posterior component flattens and
partially faces the ventral thecal sac with no more than mild spinal
stenosis. No frank cord impingement. Moderate bilateral C7 foraminal
stenosis.

C7-T1: Negative interspace. Mild facet hypertrophy. No canal or
foraminal stenosis.

Visualized upper thoracic spine demonstrates no significant finding.
IMPRESSION: 1. Degenerative spondylosis at C5-6 and C6-7 with resultant mild
spinal stenosis, with moderate to severe bilateral C6 and C7
foraminal narrowing as above.
2. Mild left C4 foraminal stenosis related to uncovertebral and
facet hypertrophy.
3. Chronic microvascular ischemic disease within the visualized
pons, with multiple remote lacunar infarcts about the visualized
brainstem and cerebellum.

## 2021-07-21 MED ORDER — IOPAMIDOL (ISOVUE-M 200) INJECTION 41%
13.0000 mL | Freq: Once | INTRAMUSCULAR | Status: AC
  Administered 2021-07-21: 13 mL via INTRA_ARTICULAR

## 2021-07-23 ENCOUNTER — Telehealth: Payer: Self-pay | Admitting: Family

## 2021-07-23 MED ORDER — TRIMETHOPRIM 100 MG PO TABS: 100.0000 mg | ORAL_TABLET | Freq: Two times a day (BID) | ORAL | 2 refills | Status: DC

## 2021-07-23 NOTE — Telephone Encounter (Signed)
See mychart.  

## 2021-07-24 ENCOUNTER — Other Ambulatory Visit: Payer: Self-pay

## 2021-07-24 ENCOUNTER — Ambulatory Visit (INDEPENDENT_AMBULATORY_CARE_PROVIDER_SITE_OTHER): Payer: Medicaid Other | Admitting: Orthopedic Surgery

## 2021-07-24 ENCOUNTER — Encounter: Payer: Self-pay | Admitting: Family

## 2021-07-24 DIAGNOSIS — M75112 Incomplete rotator cuff tear or rupture of left shoulder, not specified as traumatic: Secondary | ICD-10-CM

## 2021-07-24 MED ORDER — ADVAIR HFA 115-21 MCG/ACT IN AERO: 2.0000 | INHALATION_SPRAY | Freq: Two times a day (BID) | RESPIRATORY_TRACT | 2 refills | Status: DC

## 2021-07-24 NOTE — Telephone Encounter (Signed)
Spoke to patient.  She has reported chest tightness with cipro, macrobid, and now trimethoprim. It seems unlikely that all of these various classes of antibiotics would have the same side effect, and/or she would be allergic to these meds plus augmentin, sulfa and keflex.   ? ?I suspect what is happening is that she is having COPD exacerbation and is attributing this to the multiple antibiotics that we have tried recently. She does have relief with albuterol.  Will give trial of advair bid. Then retrial of trimethoprim. She will let me know if she has any issues.  She is scheduled to see urology on 3/31 for her recurrent UTI's. ? ?Dr. Charlett Blake- fyi.   ?

## 2021-07-25 ENCOUNTER — Encounter: Payer: Self-pay | Admitting: Orthopedic Surgery

## 2021-07-25 MED ORDER — METHYLPREDNISOLONE ACETATE 40 MG/ML IJ SUSP
40.0000 mg | INTRAMUSCULAR | Status: AC | PRN
  Administered 2021-07-24: 40 mg via INTRA_ARTICULAR

## 2021-07-25 MED ORDER — LIDOCAINE HCL 1 % IJ SOLN
5.0000 mL | INTRAMUSCULAR | Status: AC | PRN
  Administered 2021-07-24: 5 mL

## 2021-07-25 MED ORDER — BUPIVACAINE HCL 0.5 % IJ SOLN
9.0000 mL | INTRAMUSCULAR | Status: AC | PRN
  Administered 2021-07-24: 9 mL via INTRA_ARTICULAR

## 2021-07-25 NOTE — Progress Notes (Signed)
? ?Office Visit Note ?  ?Patient: Dawn Lowery           ?Date of Birth: Mar 17, 1951           ?MRN: [id number] ?Visit Date: 07/24/2021 ?Requested by: [name], NP ?[id number] ?[place],  [place] [address] ?PCP: [name], NP ? ?Subjective: ?Chief Complaint  ?Patient presents with  ? Other  ?   ?Scan review  ? ? ?HPI: Dawn Lowery is a 71 year old patient with left shoulder pain.  Since she was last seen she has had MRI scan of the cervical spine and shoulder.  Shoulder MRI scan shows punctate tearing medial to the rotator cuff attachment site.  Looks like more of a degenerative type tear with no retraction.  MRI scan of the cervical spine shows severe C6 left-sided foraminal narrowing.  Hard for her to get comfortable at night.  Putting her arm overhead makes her symptoms no better. ?             ?ROS: All systems reviewed are negative as they relate to the chief complaint within the history of present illness.  Patient denies  fevers or chills. ? ? ?Assessment & Plan: ?Visit Diagnoses: No diagnosis found. ? ?Plan: Impression is cervical radiculopathy and very small punctate rotator cuff tear medial to the attachment site by centimeter and a half.  Not too much arthritis in the glenohumeral joint.  Plan is subacromial injection per patient request.  I think that most of her shoulder and arm symptoms may be coming from that severe foraminal compression at C6.  This rotator cuff problem is something that may or may not go on to require treatment.  Her strength is actually good.  If she wants to get injections in her neck she will let me know. ? ?Follow-Up Instructions: Return if symptoms worsen or fail to improve.  ? ?Orders:  ?No orders of the defined types were placed in this encounter. ? ?No orders of the defined types were placed in this encounter. ? ? ? ? Procedures: ?Large Joint Inj: L subacromial bursa on 07/24/2021 9:06 PM ?Indications: diagnostic evaluation and pain ?Details: 18 G  1.5 in needle, posterior approach ? ?Arthrogram: No ? ?Medications: 9 mL bupivacaine 0.5 %; 40 mg methylPREDNISolone acetate 40 MG/ML; 5 mL lidocaine 1 % ?Outcome: tolerated well, no immediate complications ?Procedure, treatment alternatives, risks and benefits explained, specific risks discussed. Consent was given by the patient. Immediately prior to procedure a time out was called to verify the correct patient, procedure, equipment, support staff and site/side marked as required. Patient was prepped and draped in the usual sterile fashion.  ? ? ? ? ?Clinical Data: ?No additional findings. ? ?Objective: ?Vital Signs: There were no vitals taken for this visit. ? ?Physical Exam:  ? ?Constitutional: Patient appears well-developed ?HEENT:  ?Head: Normocephalic ?Eyes:EOM are normal ?Neck: Normal range of motion ?Cardiovascular: Normal rate ?Pulmonary/chest: Effort normal ?Neurologic: Patient is alert ?Skin: Skin is warm ?Psychiatric: Patient has normal mood and affect ? ? ?Ortho Exam: Ortho exam demonstrates no coarse grinding or crepitus with active or passive range of motion of the left shoulder.  She has passive range of motion of 55/95/165.  Rotator cuff strength is intact demonstrated supraspinatus Muscle Testing.  No Definite Paresthesias C5-T1.  Neck Range Of Motion Is Full but She Does Have a Little Bit of Reproduce Symptoms with Rotation to the Left.  Radial Pulse Intact.  No Discrete AC Joint Tenderness Is Present ? ?  Specialty Comments:  ?No specialty comments available. ? ?Imaging: ?No results found. ? ? ?PMFS History: ?Patient Active Problem List  ? Diagnosis Date Noted  ? Acute cystitis without hematuria 06/21/2021  ? COPD with acute exacerbation ([place]) 06/21/2021  ? SOB (shortness of breath) 05/19/2021  ? Skin cancer 10/12/2020  ? Nevus 09/30/2020  ? GERD (gastroesophageal reflux disease) 09/30/2020  ? Cerebrovascular accident (CVA) due to embolism of left middle cerebral artery ([place]) 06/14/2020  ? B12  deficiency 06/14/2020  ? Tobacco abuse 06/14/2020  ? Hyperlipidemia 06/14/2020  ? Essential hypertension 06/14/2020  ? ?Past Medical History:  ?Diagnosis Date  ? Hypertension   ?  ?Family History  ?Problem Relation Age of Onset  ? Lung cancer Mother   ? Lung cancer Father   ? Obesity Sister   ? Hypertension Sister   ?     Dawn Lowery  ? Lung cancer Paternal Grandfather   ?  ?Past Surgical History:  ?Procedure Laterality Date  ? ABDOMINAL HYSTERECTOMY    ? ELBOW SURGERY Right 2011  ? tennis elbow  ? FOOT SURGERY Right   ? big toe- pinning due to fracture  ? HAND SURGERY Left   ? thumb surgery  ? ?Social History  ? ?Occupational History  ? Not on file  ?Tobacco Use  ? Smoking status: Every Day  ?  Packs/day: 0.50  ?  Years: 20.00  ?  Pack years: 10.00  ?  Types: Cigarettes  ? Smokeless tobacco: Never  ?Substance and Sexual Activity  ? Alcohol use: Not Currently  ? Drug use: Never  ? Sexual activity: Not Currently  ? ? ? ? ? ?

## 2021-07-31 ENCOUNTER — Encounter: Payer: Self-pay | Admitting: Orthopedic Surgery

## 2021-07-31 DIAGNOSIS — M79601 Pain in right arm: Secondary | ICD-10-CM

## 2021-08-02 NOTE — Telephone Encounter (Signed)
Yes scheduling injection with Dr. Ernestina Patches for her neck would be good.  Thanks

## 2021-08-03 ENCOUNTER — Encounter: Payer: Self-pay | Admitting: Orthopedic Surgery

## 2021-08-04 ENCOUNTER — Other Ambulatory Visit: Payer: Self-pay | Admitting: Surgical

## 2021-08-04 MED ORDER — PREDNISONE 10 MG (21) PO TBPK: ORAL_TABLET | ORAL | 0 refills | Status: DC

## 2021-08-04 NOTE — Telephone Encounter (Signed)
Sounds like it may be related to her cervical spine pathology, I will send in MDP to see if this helps with her symptoms

## 2021-08-07 ENCOUNTER — Telehealth: Payer: Self-pay | Admitting: Physical Medicine and Rehabilitation

## 2021-08-07 NOTE — Telephone Encounter (Signed)
Pt called to set a referral appt for neck injection. Please call pt at [phone] ?

## 2021-08-21 ENCOUNTER — Telehealth: Payer: Self-pay | Admitting: Family

## 2021-08-21 ENCOUNTER — Encounter: Payer: Self-pay | Admitting: Family

## 2021-08-21 ENCOUNTER — Other Ambulatory Visit: Payer: Self-pay | Admitting: Family

## 2021-08-21 MED ORDER — ALBUTEROL SULFATE HFA 108 (90 BASE) MCG/ACT IN AERS: 2.0000 | INHALATION_SPRAY | Freq: Four times a day (QID) | RESPIRATORY_TRACT | 0 refills | Status: DC | PRN

## 2021-08-21 NOTE — Telephone Encounter (Signed)
Pt would like to know if she has had problems in the past with cephalexin. She states some medications give her a reaction but she cannot remember which ones. Her urologist recommend she take this medication, and she just wanted to make sure it was safe. Please advise.  ?

## 2021-08-21 NOTE — Telephone Encounter (Signed)
Hello Dawn Lowery, it looks like here in your charts. ?You have an Allergic reaction to Cephalexin of itching ,so you can let your urologist know.  ?

## 2021-08-21 NOTE — Telephone Encounter (Signed)
Shamaine, ? ?FYI you sent the patient message to me not a mychart message to the patient.  I resent. Tks.  ?

## 2021-08-21 NOTE — Telephone Encounter (Signed)
Pt called back. °

## 2021-08-22 ENCOUNTER — Encounter: Payer: Self-pay | Admitting: Family

## 2021-08-22 DIAGNOSIS — N390 Urinary tract infection, site not specified: Secondary | ICD-10-CM

## 2021-08-23 ENCOUNTER — Other Ambulatory Visit: Payer: Medicaid Other

## 2021-08-23 DIAGNOSIS — N390 Urinary tract infection, site not specified: Secondary | ICD-10-CM

## 2021-08-23 MED ORDER — ESTRADIOL 0.1 MG/GM VA CREA: TOPICAL_CREAM | VAGINAL | 12 refills | Status: DC

## 2021-08-23 MED ORDER — TRIMETHOPRIM 100 MG PO TABS: 100.0000 mg | ORAL_TABLET | Freq: Every day | ORAL | 1 refills | Status: DC

## 2021-08-23 NOTE — Telephone Encounter (Signed)
Spoke to patient.  She states that she tolerated trimethoprim.  Denies dysuria, still having foul smelling urine.  She will stop by today to leave a urine specimen for culture.  For prophylaxis, will continue trimethoprim once daily and also will give trial of estradiol cream to see if this helps prevent recurrence.  ?

## 2021-08-24 ENCOUNTER — Other Ambulatory Visit: Payer: Medicaid Other

## 2021-08-24 DIAGNOSIS — N390 Urinary tract infection, site not specified: Secondary | ICD-10-CM

## 2021-08-25 ENCOUNTER — Telehealth: Payer: Self-pay | Admitting: Physical Medicine and Rehabilitation

## 2021-08-25 NOTE — Telephone Encounter (Signed)
Patient called asked for a call back concerning what you were speaking about yesterday. Patient did not give anymore information. ?The number to contact patient is [phone] ?

## 2021-08-26 LAB — URINE CULTURE
MICRO NUMBER:: 13259663
SPECIMEN QUALITY:: ADEQUATE

## 2021-08-28 ENCOUNTER — Encounter: Payer: Self-pay | Admitting: Family

## 2021-08-28 ENCOUNTER — Telehealth: Payer: Self-pay | Admitting: Family

## 2021-08-28 MED ORDER — FOSFOMYCIN TROMETHAMINE 3 G PO PACK: 3.0000 g | PACK | Freq: Once | ORAL | 0 refills | Status: AC

## 2021-08-28 NOTE — Telephone Encounter (Signed)
Please contact pt and let her know that I reviewed her urine culture results and she still has a urinary tract infection.  I would like for her to try a different antibiotic that she has not tried before.  This is really our last option except for IV drugs that can be given in the hospital. The medication is not covered by her insurance. I texted her a good rx coupon that she can use instead. It should bring cost down to $30.  Rx was sent to [facility].  ? ?I would like her to repeat a urine culture in 10 days please.  Dx UTI. ? ? ?

## 2021-08-28 NOTE — Telephone Encounter (Signed)
Patient advised of results, new medication and provider's advise on using Good rx coupon to fill rx.  ?Urine culture can be obtained during 6 months follow up appt 09-08-21 ?

## 2021-08-30 ENCOUNTER — Telehealth: Payer: Self-pay | Admitting: Family

## 2021-08-30 NOTE — Telephone Encounter (Signed)
Shena from ortho care states pt is needs an approval for interlaminar neck injection and cervical epidural. Her direct line is listed as well.  ?

## 2021-08-30 NOTE — Telephone Encounter (Signed)
Called but no answer, lvm Shena from ortho to call back ?

## 2021-09-05 ENCOUNTER — Ambulatory Visit: Payer: Medicaid Other | Admitting: Family

## 2021-09-08 ENCOUNTER — Ambulatory Visit (INDEPENDENT_AMBULATORY_CARE_PROVIDER_SITE_OTHER): Payer: Medicaid Other | Admitting: Family

## 2021-09-08 ENCOUNTER — Telehealth: Payer: Self-pay | Admitting: Family

## 2021-09-08 VITALS — BP 178/85 | HR 82 | Temp 98.5°F | Resp 16 | Wt 155.8 lb

## 2021-09-08 DIAGNOSIS — Z720 Tobacco use: Secondary | ICD-10-CM

## 2021-09-08 DIAGNOSIS — I10 Essential (primary) hypertension: Secondary | ICD-10-CM | POA: Diagnosis not present

## 2021-09-08 DIAGNOSIS — M4802 Spinal stenosis, cervical region: Secondary | ICD-10-CM | POA: Diagnosis not present

## 2021-09-08 DIAGNOSIS — N390 Urinary tract infection, site not specified: Secondary | ICD-10-CM

## 2021-09-08 MED ORDER — LISINOPRIL 20 MG PO TABS: 20.0000 mg | ORAL_TABLET | Freq: Every day | ORAL | 1 refills | Status: DC

## 2021-09-08 NOTE — Patient Instructions (Addendum)
Stop Aspirin 7 days prior to upcoming procedure on your neck.  ?Please complete urine specimen prior to leaving.  ?Restart lisinopril '10mg'$ .  ?

## 2021-09-08 NOTE — Assessment & Plan Note (Addendum)
Uncontrolled. Repeat Urine culture today. Will refer to urology for a second opinion. Our treatment options are very limited given her multiple drug allergies and the resistance profile of her urine culture.  Continue daily trimpex for now.  ?

## 2021-09-08 NOTE — Assessment & Plan Note (Signed)
Uncontrolled. Will restart lisinopril 20 mg once daily. Continue hctz.  ?

## 2021-09-08 NOTE — Assessment & Plan Note (Signed)
She has cut back to 1/2 ppd. She is on her second month of chantix. Will continue for a total of 12 weeks.  ?

## 2021-09-08 NOTE — Assessment & Plan Note (Signed)
Advised pt OK to proceed with planned cervical spine injections from a medical standpoint. She understands need to hold her aspirin for 7 days prior to planned procedure.  Pt verbalizes understanding.  ?

## 2021-09-08 NOTE — Telephone Encounter (Signed)
Notes faxed to Dr. Vonzella Nipple ?

## 2021-09-08 NOTE — Progress Notes (Signed)
? ?Subjective:  ? ? ? Patient ID: Dawn Lowery, female    DOB: Sep 03, 1950, 71 y.o.   MRN: [id number] ? ?Chief Complaint  ?Patient presents with  ? Hypertension  ?  Here for follow up  ? Urinary Tract Infection  ?  Patient reports still having urine odor  ? ? ?HPI ? ?Patient is in today for follow up.  ? ?Recurrent UTI- last urine culture grew E. Coli.  She was treated with fosfamycin.  Noted one or two days of resolution of urinary odor, but then odor returned. She does not have dysuria, but does report a sensation of "rawness down there." She did start the vaginal estrogen.  ? ?It also looks like ortho is planning an interlaminar neck injection and cervical epidural injection. (This is per phone note). They are requesting medical clearance for this procedure.  ? ? ?Health Maintenance Due  ?Topic Date Due  ? URINE MICROALBUMIN  Never done  ? COLONOSCOPY (Pts 45-46yr Insurance coverage will need to be confirmed)  Never done  ? DEXA SCAN  Never done  ? COVID-19 Vaccine (3 - Moderna risk series) 03/20/2021  ? Pneumonia Vaccine 71 Years old (2 - PCV) 09/02/2021  ? ? ?Past Medical History:  ?Diagnosis Date  ? Hypertension   ? ? ?Past Surgical History:  ?Procedure Laterality Date  ? ABDOMINAL HYSTERECTOMY    ? ELBOW SURGERY Right 2011  ? tennis elbow  ? FOOT SURGERY Right   ? big toe- pinning due to fracture  ? HAND SURGERY Left   ? thumb surgery  ? ? ?Family History  ?Problem Relation Age of Onset  ? Lung cancer Mother   ? Lung cancer Father   ? Obesity Sister   ? Hypertension Sister   ?     GJossie Ng ? Lung cancer Paternal Grandfather   ? ? ?Social History  ? ?Socioeconomic History  ? Marital status: Divorced  ?  Spouse name: Not on file  ? Number of children: Not on file  ? Years of education: Not on file  ? Highest education level: Not on file  ?Occupational History  ? Not on file  ?Tobacco Use  ? Smoking status: Every Day  ?  Packs/day: 0.50  ?  Years: 20.00  ?  Pack years: 10.00  ?  Types: Cigarettes  ?  Smokeless tobacco: Never  ?Substance and Sexual Activity  ? Alcohol use: Not Currently  ? Drug use: Never  ? Sexual activity: Not Currently  ?Other Topics Concern  ? Not on file  ?Social History Narrative  ? Retired cEngineer, structural ? [place] ? Daughter lives locally  ? 5 grandchildren and 1 great grandson  ? Enjoys spending time with her daughter and relaxing.   ? 2 dogs  ? Single  ? Completed HS  ? ?Social Determinants of Health  ? ?Financial Resource Strain: Low Risk   ? Difficulty of Paying Living Expenses: Not hard at all  ?Food Insecurity: No Food Insecurity  ? Worried About RCharity fundraiserin the Last Year: Never true  ? Ran Out of Food in the Last Year: Never true  ?Transportation Needs: No Transportation Needs  ? Lack of Transportation (Medical): No  ? Lack of Transportation (Non-Medical): No  ?Physical Activity: Inactive  ? Days of Exercise per Week: 0 days  ? Minutes of Exercise per Session: 0 min  ?Stress: No Stress Concern Present  ? Feeling of Stress :  Not at all  ?Social Connections: Socially Isolated  ? Frequency of Communication with Friends and Family: More than three times a week  ? Frequency of Social Gatherings with Friends and Family: More than three times a week  ? Attends Religious Services: Never  ? Active Member of Clubs or Organizations: No  ? Attends Archivist Meetings: Never  ? Marital Status: Divorced  ?Intimate Partner Violence: Not At Risk  ? Fear of Current or Ex-Partner: No  ? Emotionally Abused: No  ? Physically Abused: No  ? Sexually Abused: No  ? ? ?Outpatient Medications Prior to Visit  ?Medication Sig Dispense Refill  ? albuterol (VENTOLIN HFA) 108 (90 Base) MCG/ACT inhaler Inhale 2 puffs into the lungs every 6 (six) hours as needed for wheezing or shortness of breath. 8 g 0  ? aspirin 325 MG tablet Take by mouth daily.    ? atorvastatin (LIPITOR) 80 MG tablet Take 1 tablet (80 mg total) by mouth daily. 90 tablet 1  ? chlorpheniramine-HYDROcodone  (TUSSIONEX PENNKINETIC ER) 10-8 MG/5ML Take 5 mLs by mouth every 12 (twelve) hours as needed for cough. 50 mL 0  ? estradiol (ESTRACE VAGINAL) 0.1 MG/GM vaginal cream Insert 2 g daily intravaginally for 2 weeks, followed by a maintenance dose of 1 g two 42.5 g 12  ? fluticasone-salmeterol (ADVAIR HFA) 115-21 MCG/ACT inhaler Inhale 2 puffs into the lungs 2 (two) times daily. 1 each 2  ? hydrochlorothiazide (HYDRODIURIL) 25 MG tablet Take 1 tablet (25 mg total) by mouth daily. 90 tablet 1  ? potassium chloride SA (KLOR-CON M) 20 MEQ tablet Take 1 tablet (20 mEq total) by mouth daily. 90 tablet 1  ? trimethoprim (TRIMPEX) 100 MG tablet Take 1 tablet (100 mg total) by mouth daily. To prevent UTI 90 tablet 1  ? varenicline (CHANTIX PAK) 0.5 MG X 11 & 1 MG X 42 tablet Take one 0.5 mg tablet by mouth once daily for 3 days, then increase to one 0.5 mg tablet twice daily for 4 days, then increase to one 1 mg tablet twice daily. 53 tablet 0  ? vitamin B-12 (CYANOCOBALAMIN) 1000 MCG tablet Take 1 tablet (1,000 mcg total) by mouth daily.    ? predniSONE (STERAPRED UNI-PAK 21 TAB) 10 MG (21) TBPK tablet Take as directed on package 21 tablet 0  ? ?No facility-administered medications prior to visit.  ? ? ?Allergies  ?Allergen Reactions  ? Augmentin [Amoxicillin-Pot Clavulanate] Hives and Itching  ? Ciprofloxacin   ?  ? Chest tightness  ? Keflex [Cephalexin]   ?  itching  ? Macrobid [Nitrofurantoin]   ?  ? Chest tightness  ? Morphine And Related Nausea And Vomiting  ? Sulfa Antibiotics Hives  ? ? ?ROS ?See HPI ?   ?Objective:  ?  ?Physical Exam ?Constitutional:   ?   General: She is not in acute distress. ?   Appearance: Normal appearance. She is well-developed.  ?HENT:  ?   Head: Normocephalic and atraumatic.  ?   Right Ear: External ear normal.  ?   Left Ear: External ear normal.  ?Eyes:  ?   General: No scleral icterus. ?Neck:  ?   Thyroid: No thyromegaly.  ?Cardiovascular:  ?   Rate and Rhythm: Normal rate and regular rhythm.   ?   Heart sounds: Normal heart sounds. No murmur heard. ?Pulmonary:  ?   Effort: Pulmonary effort is normal. No respiratory distress.  ?   Breath sounds: Normal breath sounds. No wheezing.  ?  Musculoskeletal:  ?   Cervical back: Neck supple.  ?Skin: ?   General: Skin is warm and dry.  ?Neurological:  ?   Mental Status: She is alert and oriented to person, place, and time.  ?Psychiatric:     ?   Mood and Affect: Mood normal.     ?   Behavior: Behavior normal.     ?   Thought Content: Thought content normal.     ?   Judgment: Judgment normal.  ? ? ?BP (!) 178/85 (BP Location: Right Arm, Patient Position: Sitting, Cuff Size: Small)   Pulse 82   Temp 98.5 ?F (36.9 ?C) (Oral)   Resp 16   Wt 155 lb 12.8 oz (70.7 kg)   SpO2 100%   BMI 25.93 kg/m?  ?Wt Readings from Last 3 Encounters:  ?09/08/21 155 lb 12.8 oz (70.7 kg)  ?07/18/21 138 lb (62.6 kg)  ?06/21/21 148 lb (67.1 kg)  ? ? ?   ?Assessment & Plan:  ? ?Problem List Items Addressed This Visit   ? ?  ? Unprioritized  ? Tobacco abuse  ?  She has cut back to 1/2 ppd. She is on her second month of chantix. Will continue for a total of 12 weeks.  ? ?  ?  ? Recurrent UTI - Primary  ?  Uncontrolled. Repeat Urine culture today. Will refer to urology for a second opinion. Our treatment options are very limited given her multiple drug allergies and the resistance profile of her urine culture.  Continue daily trimpex for now.  ? ?  ?  ? Relevant Orders  ? Ambulatory referral to Urology  ? Urine Culture  ? Neural foraminal stenosis of cervical spine  ?  Advised pt OK to proceed with planned cervical spine injections from a medical standpoint. She understands need to hold her aspirin for 7 days prior to planned procedure.  Pt verbalizes understanding.  ? ?  ?  ? Essential hypertension  ?  Uncontrolled. Will restart lisinopril 20 mg once daily. Continue hctz.  ? ?  ?  ? Relevant Medications  ? lisinopril (ZESTRIL) 20 MG tablet  ? ? ?I have discontinued [place] "[name]"'s  predniSONE. I am also having her start on lisinopril. Additionally, I am having her maintain her aspirin, vitamin B-12, atorvastatin, hydrochlorothiazide, varenicline, chlorpheniramine-HYDROcodone, potassium chlo

## 2021-09-08 NOTE — Telephone Encounter (Signed)
Please fax my office note from today to Dr. Magnus Sinning for procedure clearance.  ?

## 2021-09-10 LAB — URINE CULTURE
MICRO NUMBER:: 13326399
SPECIMEN QUALITY:: ADEQUATE

## 2021-09-11 ENCOUNTER — Telehealth: Payer: Self-pay | Admitting: Family

## 2021-09-11 ENCOUNTER — Encounter: Payer: Self-pay | Admitting: Orthopedic Surgery

## 2021-09-11 ENCOUNTER — Other Ambulatory Visit: Payer: Self-pay | Admitting: Surgical

## 2021-09-11 DIAGNOSIS — N390 Urinary tract infection, site not specified: Secondary | ICD-10-CM

## 2021-09-11 MED ORDER — TRAMADOL HCL 50 MG PO TABS: 50.0000 mg | ORAL_TABLET | Freq: Two times a day (BID) | ORAL | 0 refills | Status: DC | PRN

## 2021-09-11 NOTE — Telephone Encounter (Signed)
[facility] urology called to state the dr has rejected the referral. They stated there was no reason listed.  ?

## 2021-09-11 NOTE — Telephone Encounter (Signed)
Sent in RX for tramadol

## 2021-09-13 ENCOUNTER — Encounter: Payer: Self-pay | Admitting: Family

## 2021-09-20 ENCOUNTER — Other Ambulatory Visit: Payer: Self-pay | Admitting: Surgical

## 2021-09-20 MED ORDER — TRAMADOL HCL 50 MG PO TABS
50.0000 mg | ORAL_TABLET | Freq: Every day | ORAL | 0 refills | Status: DC | PRN
Start: 2021-09-20 — End: 2021-11-22

## 2021-10-17 ENCOUNTER — Ambulatory Visit (INDEPENDENT_AMBULATORY_CARE_PROVIDER_SITE_OTHER): Payer: Medicaid Other | Admitting: Physical Medicine and Rehabilitation

## 2021-10-17 ENCOUNTER — Ambulatory Visit: Payer: Self-pay

## 2021-10-17 ENCOUNTER — Encounter: Payer: Self-pay | Admitting: Physical Medicine and Rehabilitation

## 2021-10-17 VITALS — BP 152/96 | HR 86

## 2021-10-17 DIAGNOSIS — M5412 Radiculopathy, cervical region: Secondary | ICD-10-CM

## 2021-10-17 MED ORDER — METHYLPREDNISOLONE ACETATE 80 MG/ML IJ SUSP
80.0000 mg | Freq: Once | INTRAMUSCULAR | Status: AC
  Administered 2021-10-17: 80 mg

## 2021-10-17 NOTE — Progress Notes (Unsigned)
Pt state neck pain that travels to both shoulders. Pt state laying down at night makes the pain worse. Pt state she takes pain meds to help ease her pain.  Numeric Pain Rating Scale and Functional Assessment Average Pain 5   In the last MONTH (on 0-10 scale) has pain interfered with the following?  1. General activity like being  able to carry out your everyday physical activities such as walking, climbing stairs, carrying groceries, or moving a chair?  Rating(10)   +Driver, -BT, -Dye Allergies.

## 2021-10-17 NOTE — Patient Instructions (Signed)

## 2021-11-22 ENCOUNTER — Ambulatory Visit (INDEPENDENT_AMBULATORY_CARE_PROVIDER_SITE_OTHER): Payer: Medicaid Other | Admitting: Surgical

## 2021-11-22 ENCOUNTER — Encounter: Payer: Self-pay | Admitting: Orthopedic Surgery

## 2021-11-22 DIAGNOSIS — M75112 Incomplete rotator cuff tear or rupture of left shoulder, not specified as traumatic: Secondary | ICD-10-CM

## 2021-11-22 MED ORDER — TRAMADOL HCL 50 MG PO TABS: 50.0000 mg | ORAL_TABLET | Freq: Every day | ORAL | 0 refills | Status: DC | PRN

## 2021-11-22 MED ORDER — BUPIVACAINE HCL 0.5 % IJ SOLN
9.0000 mL | INTRAMUSCULAR | Status: AC | PRN
  Administered 2021-11-22: 9 mL via INTRA_ARTICULAR

## 2021-11-22 MED ORDER — LIDOCAINE HCL 1 % IJ SOLN
5.0000 mL | INTRAMUSCULAR | Status: AC | PRN
  Administered 2021-11-22: 5 mL

## 2021-11-22 MED ORDER — METHYLPREDNISOLONE ACETATE 40 MG/ML IJ SUSP
40.0000 mg | INTRAMUSCULAR | Status: AC | PRN
  Administered 2021-11-22: 40 mg via INTRA_ARTICULAR

## 2021-11-22 NOTE — Progress Notes (Signed)
Office Visit Note   Patient: Dawn Lowery           Date of Birth: Jul 13, 1950           MRN: [id number] Visit Date: 11/22/2021 Requested by: [name], NP [place] [address],  [place] [address] PCP: [name], NP  Subjective: Chief Complaint  Patient presents with   Other    Recheck shoulder-wants repeat injection    HPI: Dawn Lowery is a 71 y.o. female who presents to the office complaining of left shoulder pain.  Patient complains of continual left lateral shoulder pain primarily with occasional radiation down to the elbow.  Also notes neck pain and trapezial pain bilaterally.  No radicular pain or numbness/tingling that travels down the left arm.  Had previous subacromial injection on 07/24/2021 that gave her great relief of her shoulder pain.  Also had cervical spine ESI on 10/17/2021 that gave her great relief of her trapezial and neck pain and to some extent some of her left shoulder pain.  She has had no new injury since her last evaluation.  She would like to repeat subacromial injection today..                ROS: All systems reviewed are negative as they relate to the chief complaint within the history of present illness.  Patient denies fevers or chills.  Assessment & Plan: Visit Diagnoses:  1. Incomplete rotator cuff tear or rupture of left shoulder, not specified as traumatic     Plan: Patient is a 71 year old female who presents complaining of left shoulder pain.  She has previous MRI demonstrating partial-thickness rotator cuff tear and mild glenohumeral arthritis.  She had great relief with subacromial injection in mid March and would like to repeat this today.  She also had recent cervical spine ESI that gave her good relief of her neck pain and to some extent her shoulder pain but she feels like she had better relief from subacromial injection.  Injection repeated today.  Excellent rotator cuff strength on exam today.  Follow-up with  the office as needed.  Follow-Up Instructions: Return if symptoms worsen or fail to improve.   Orders:  No orders of the defined types were placed in this encounter.  No orders of the defined types were placed in this encounter.     Procedures: Large Joint Inj: L subacromial bursa on 11/22/2021 11:51 AM Indications: diagnostic evaluation and pain Details: 18 G 1.5 in needle, posterior approach  Arthrogram: No  Medications: 9 mL bupivacaine 0.5 %; 40 mg methylPREDNISolone acetate 40 MG/ML; 5 mL lidocaine 1 % Outcome: tolerated well, no immediate complications Procedure, treatment alternatives, risks and benefits explained, specific risks discussed. Consent was given by the patient. Immediately prior to procedure a time out was called to verify the correct patient, procedure, equipment, support staff and site/side marked as required. Patient was prepped and draped in the usual sterile fashion.       Clinical Data: No additional findings.  Objective: Vital Signs: There were no vitals taken for this visit.  Physical Exam:  Constitutional: Patient appears well-developed HEENT:  Head: Normocephalic Eyes:EOM are normal Neck: Normal range of motion Cardiovascular: Normal rate Pulmonary/chest: Effort normal Neurologic: Patient is alert Skin: Skin is warm Psychiatric: Patient has normal mood and affect  Ortho Exam: Ortho exam demonstrates left shoulder with well-preserved passive and active range of motion.  No significant crepitus with passive motion of the shoulder.  Excellent rotator cuff strength of supra, infra, subscap.  Moderate tenderness over the [facility] joint.  Mild tenderness over the bicipital groove.  5/5 motor strength of bilateral grip strength, finger abduction, pronation/supination, bicep, tricep, deltoid.  Specialty Comments:  MRI CERVICAL SPINE WITHOUT CONTRAST   TECHNIQUE: Multiplanar, multisequence MR imaging of the cervical spine was performed. No intravenous  contrast was administered.   COMPARISON:  Prior radiograph from 06/07/2021.   FINDINGS: Alignment: Mild straightening of the normal cervical lordosis. No listhesis.   Vertebrae: Vertebral body height maintained without acute or chronic fracture. Bone marrow signal intensity mildly heterogeneous but overall within normal limits. No discrete or worrisome osseous lesions. Mild discogenic reactive endplate change present about the C5-6 and C6-7 interspaces. No other abnormal marrow edema.   Cord: Normal signal and morphology.   Posterior Fossa, vertebral arteries, paraspinal tissues: Hazy signal abnormality within the visualized pons most consistent with chronic microvascular ischemic disease. Few superimposed small remote lacunar infarcts noted. Few small remote cerebellar infarcts partially visualized as well. Craniocervical junction normal. Paraspinous soft tissues normal. Normal flow voids seen within the vertebral arteries bilaterally.   Disc levels:   C2-C3: Normal interspace. Left-sided facet hypertrophy with associated ankylosis. No canal or foraminal stenosis.   C3-C4: Disc desiccation with small central disc protrusion (series 5, image 18). Mild left greater than right facet hypertrophy. No spinal stenosis. Mild left C4 foraminal narrowing. Right neural foramen remains patent.   C4-C5: Disc desiccation with mild annular bulge. Mild facet hypertrophy. No spinal stenosis. Foramina remain patent.   C5-C6: Moderate intervertebral disc space narrowing with diffuse disc osteophyte complex. Flattening and partial effacement of the ventral thecal sac with resultant mild spinal stenosis. No frank cord impingement. Severe left with moderate right C6 foraminal narrowing.   C6-C7: Moderate intervertebral disc space narrowing with diffuse disc osteophyte complex. Broad posterior component flattens and partially faces the ventral thecal sac with no more than mild  spinal stenosis. No frank cord impingement. Moderate bilateral C7 foraminal stenosis.   C7-T1: Negative interspace. Mild facet hypertrophy. No canal or foraminal stenosis.   Visualized upper thoracic spine demonstrates no significant finding.   IMPRESSION: 1. Degenerative spondylosis at C5-6 and C6-7 with resultant mild spinal stenosis, with moderate to severe bilateral C6 and C7 foraminal narrowing as above. 2. Mild left C4 foraminal stenosis related to uncovertebral and facet hypertrophy. 3. Chronic microvascular ischemic disease within the visualized pons, with multiple remote lacunar infarcts about the visualized brainstem and cerebellum.     Electronically Signed   By: Jeannine Boga M.D.   On: 07/22/2021 00:44  Imaging: No results found.   PMFS History: Patient Active Problem List   Diagnosis Date Noted   Recurrent UTI 09/08/2021   Neural foraminal stenosis of cervical spine 09/08/2021   COPD with acute exacerbation ([place]) 06/21/2021   Skin cancer 10/12/2020   [place] 09/30/2020   GERD (gastroesophageal reflux disease) 09/30/2020   Cerebrovascular accident (CVA) due to embolism of left middle cerebral artery ([place]) 06/14/2020   B12 deficiency 06/14/2020   Tobacco abuse 06/14/2020   Hyperlipidemia 06/14/2020   Essential hypertension 06/14/2020   Past Medical History:  Diagnosis Date   Hypertension     Family History  Problem Relation Age of Onset   Lung cancer Mother    Lung cancer Father    Obesity Sister    Hypertension Sister        [name]   Lung cancer Paternal Grandfather     Past Surgical History:  Procedure Laterality Date   ABDOMINAL HYSTERECTOMY     ELBOW SURGERY Right 2011   tennis elbow   FOOT SURGERY Right    big toe- pinning due to fracture   HAND SURGERY Left    thumb surgery   Social History   Occupational History   Not on file  Tobacco Use   Smoking status: Every Day    Packs/day: 0.50    Years: 20.00    Total  pack years: 10.00    Types: Cigarettes   Smokeless tobacco: Never  Substance and Sexual Activity   Alcohol use: Not Currently   Drug use: Never   Sexual activity: Not Currently

## 2021-11-22 NOTE — Addendum Note (Signed)
Addended by: Donella Stade on: 11/22/2021 12:05 PM   Modules accepted: Orders

## 2021-11-30 ENCOUNTER — Other Ambulatory Visit: Payer: Self-pay | Admitting: Family

## 2021-12-05 ENCOUNTER — Other Ambulatory Visit: Payer: Self-pay | Admitting: Family

## 2021-12-05 NOTE — Telephone Encounter (Signed)
Patient was scheduled to come in tomorrow am

## 2021-12-05 NOTE — Telephone Encounter (Signed)
Patient is overdue for follow up.  Please contact her to schedule a follow up visit.

## 2021-12-06 ENCOUNTER — Ambulatory Visit (INDEPENDENT_AMBULATORY_CARE_PROVIDER_SITE_OTHER): Payer: Medicaid Other | Admitting: Family

## 2021-12-06 VITALS — BP 124/65 | HR 64 | Temp 98.6°F | Resp 16 | Wt 153.0 lb

## 2021-12-06 DIAGNOSIS — J449 Chronic obstructive pulmonary disease, unspecified: Secondary | ICD-10-CM

## 2021-12-06 DIAGNOSIS — E785 Hyperlipidemia, unspecified: Secondary | ICD-10-CM | POA: Diagnosis not present

## 2021-12-06 DIAGNOSIS — I10 Essential (primary) hypertension: Secondary | ICD-10-CM

## 2021-12-06 DIAGNOSIS — Z1211 Encounter for screening for malignant neoplasm of colon: Secondary | ICD-10-CM

## 2021-12-06 DIAGNOSIS — Z720 Tobacco use: Secondary | ICD-10-CM

## 2021-12-06 DIAGNOSIS — Z23 Encounter for immunization: Secondary | ICD-10-CM

## 2021-12-06 DIAGNOSIS — N390 Urinary tract infection, site not specified: Secondary | ICD-10-CM

## 2021-12-06 DIAGNOSIS — Z13820 Encounter for screening for osteoporosis: Secondary | ICD-10-CM

## 2021-12-06 DIAGNOSIS — E348 Other specified endocrine disorders: Secondary | ICD-10-CM

## 2021-12-06 LAB — BASIC METABOLIC PANEL
BUN: 8 mg/dL (ref 6–23)
CO2: 31 mEq/L (ref 19–32)
Calcium: 9.4 mg/dL (ref 8.4–10.5)
Chloride: 98 mEq/L (ref 96–112)
Creatinine, Ser: 0.7 mg/dL (ref 0.40–1.20)
GFR: 87.26 mL/min (ref >60.00)
Glucose, Bld: 83 mg/dL (ref 70–99)
Potassium: 4.8 mEq/L (ref 3.5–5.1)
Sodium: 134 mEq/L — ABNORMAL LOW (ref 135–145)

## 2021-12-06 LAB — LIPID PANEL
Cholesterol: 136 mg/dL (ref 0–200)
HDL: 50.4 mg/dL (ref >39.00)
LDL Cholesterol: 66 mg/dL (ref 0–99)
NonHDL: 85.27
Total CHOL/HDL Ratio: 3
Triglycerides: 97 mg/dL (ref 0.0–149.0)
VLDL: 19.4 mg/dL (ref 0.0–40.0)

## 2021-12-06 MED ORDER — VARENICLINE TARTRATE 0.5 MG X 11 & 1 MG X 42 PO TBPK: ORAL_TABLET | ORAL | 0 refills | Status: DC

## 2021-12-06 NOTE — Assessment & Plan Note (Signed)
Stable on advair. Continue same.

## 2021-12-06 NOTE — Progress Notes (Signed)
Subjective:   By signing my name below, I, Luiz Ochoa, attest that this documentation has been prepared under the direction and in the presence of [name], NP 12/06/2021   Patient ID: Dawn Lowery, female    DOB: January 14, 1951, 71 y.o.   MRN: [id number]  No chief complaint on file.   HPI Patient is in today for follow up visit.  Endoscopy: She had a endoscopy. She reports that when she urinates, she feels a non-burning sensation and plans to see a urologist soon.   Medications: She remains compliant with hydrochlorothiazide and lisinopril. She also takes potassium daily. She takes Advair to manage her breathing without adverse effects, and also uses the vaginal estrogen at night. She no longer takes Chantix.   Colonoscopy: She has not had a colonoscopy recently.   Immunizations: She is interested in receiving a pneumonia booster.   Dexa: She has not had a bone density exam recently, and reports that she did not show any signs of osteoporosis in her last exam.   Health Maintenance Due  Topic Date Due   COLONOSCOPY (Pts 45-74yr Insurance coverage will need to be confirmed)  Never done   DEXA SCAN  Never done   COVID-19 Vaccine (3 - Moderna risk series) 03/20/2021   Pneumonia Vaccine 71 Years old (2 - PCV) 09/02/2021    Past Medical History:  Diagnosis Date   Hypertension     Past Surgical History:  Procedure Laterality Date   ABDOMINAL HYSTERECTOMY     ELBOW SURGERY Right 2011   tennis elbow   FOOT SURGERY Right    big toe- pinning due to fracture   HAND SURGERY Left    thumb surgery    Family History  Problem Relation Age of Onset   Lung cancer Mother    Lung cancer Father    Obesity Sister    Hypertension Sister        GJossie Ng  Lung cancer Paternal Grandfather     Social History   Socioeconomic History   Marital status: Divorced    Spouse name: Not on file   Number of children: Not on file   Years of education: Not on file    Highest education level: Not on file  Occupational History   Not on file  Tobacco Use   Smoking status: Every Day    Packs/day: 0.50    Years: 20.00    Total pack years: 10.00    Types: Cigarettes   Smokeless tobacco: Never  Substance and Sexual Activity   Alcohol use: Not Currently   Drug use: Never   Sexual activity: Not Currently  Other Topics Concern   Not on file  Social History Narrative   Retired cEngineer, 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    2 dogs   Single   Completed HS   Social Determinants of Health   Financial Resource Strain: Low Risk  (03/09/2021)   Overall Financial Resource Strain (CARDIA)    Difficulty of Paying Living Expenses: Not hard at all  Food Insecurity: No Food Insecurity (03/09/2021)   Hunger Vital Sign    Worried About Running Out of Food in the Last Year: Never true    Ran Out of Food in the Last Year: Never true  Transportation Needs: No Transportation Needs (03/09/2021)   PRAPARE - Transportation  Lack of Transportation (Medical): No    Lack of Transportation (Non-Medical): No  Physical Activity: Inactive (03/09/2021)   Exercise Vital Sign    Days of Exercise per Week: 0 days    Minutes of Exercise per Session: 0 min  Stress: No Stress Concern Present (03/09/2021)   [place]    Feeling of Stress : Not at all  Social Connections: Socially Isolated (03/09/2021)   Social Connection and Isolation Panel [NHANES]    Frequency of Communication with Friends and Family: More than three times a week    Frequency of Social Gatherings with Friends and Family: More than three times a week    Attends Religious Services: Never    Marine scientist or Organizations: No    Attends Archivist Meetings: Never    Marital Status: Divorced  Arboriculturist Violence: Not At Risk (03/09/2021)   Humiliation, Afraid, Rape, and Kick questionnaire    Fear of Current or Ex-Partner: No    Emotionally Abused: No    Physically Abused: No    Sexually Abused: No    Outpatient Medications Prior to Visit  Medication Sig Dispense Refill   albuterol (VENTOLIN HFA) 108 (90 Base) MCG/ACT inhaler Inhale 2 puffs into the lungs every 6 (six) hours as needed for wheezing or shortness of breath. 8 g 0   aspirin 325 MG tablet Take by mouth daily.     atorvastatin (LIPITOR) 80 MG tablet Take 1 tablet by mouth once daily 90 tablet 0   chlorpheniramine-HYDROcodone (TUSSIONEX PENNKINETIC ER) 10-8 MG/5ML Take 5 mLs by mouth every 12 (twelve) hours as needed for cough. 50 mL 0   estradiol (ESTRACE VAGINAL) 0.1 MG/GM vaginal cream Insert 2 g daily intravaginally for 2 weeks, followed by a maintenance dose of 1 g two 42.5 g 12   fluticasone-salmeterol (ADVAIR HFA) 115-21 MCG/ACT inhaler Inhale 2 puffs into the lungs 2 (two) times daily. 1 each 2   hydrochlorothiazide (HYDRODIURIL) 25 MG tablet Take 1 tablet by mouth once daily 30 tablet 0   lisinopril (ZESTRIL) 20 MG tablet Take 1 tablet (20 mg total) by mouth daily. 90 tablet 1   potassium chloride SA (KLOR-CON M) 20 MEQ tablet Take 1 tablet (20 mEq total) by mouth daily. 90 tablet 1   traMADol (ULTRAM) 50 MG tablet Take 1 tablet (50 mg total) by mouth daily as needed. 20 tablet 0   trimethoprim (TRIMPEX) 100 MG tablet Take 1 tablet (100 mg total) by mouth daily. To prevent UTI 90 tablet 1   varenicline (CHANTIX PAK) 0.5 MG X 11 & 1 MG X 42 tablet Take one 0.5 mg tablet by mouth once daily for 3 days, then increase to one 0.5 mg tablet twice daily for 4 days, then increase to one 1 mg tablet twice daily. 53 tablet 0   vitamin B-12 (CYANOCOBALAMIN) 1000 MCG tablet Take 1 tablet (1,000 mcg total) by mouth daily.     No facility-administered medications prior to visit.    Allergies  Allergen Reactions   Augmentin  [Amoxicillin-Pot Clavulanate] Hives and Itching   Ciprofloxacin     ? Chest tightness   Keflex [Cephalexin]     itching   Macrobid [Nitrofurantoin]     ? Chest tightness   Morphine And Related Nausea And Vomiting   Sulfa Antibiotics Hives    Review of Systems  Genitourinary:  Positive for dysuria.       Objective:  Physical Exam Constitutional:      Appearance: Normal appearance. She is not ill-appearing.  HENT:     Head: Normocephalic and atraumatic.     Right Ear: External ear normal.     Left Ear: External ear normal.  Eyes:     Extraocular Movements: Extraocular movements intact.     Pupils: Pupils are equal, round, and reactive to light.  Cardiovascular:     Rate and Rhythm: Normal rate and regular rhythm.     Pulses: Normal pulses.     Heart sounds: Normal heart sounds. No murmur heard. Pulmonary:     Effort: Pulmonary effort is normal. No respiratory distress.     Breath sounds: Normal breath sounds. No wheezing or rales.  Skin:    General: Skin is warm and dry.  Neurological:     Mental Status: She is alert and oriented to person, place, and time.  Psychiatric:        Judgment: Judgment normal.     There were no vitals taken for this visit. Wt Readings from Last 3 Encounters:  09/08/21 155 lb 12.8 oz (70.7 kg)  07/18/21 138 lb (62.6 kg)  06/21/21 148 lb (67.1 kg)       Assessment & Plan:   Problem List Items Addressed This Visit   None    No orders of the defined types were placed in this encounter.   I, Luiz Ochoa, personally preformed the services described in this documentation.  All medical record entries made by the scribe were at my direction and in my presence.  I have reviewed the chart and discharge instructions (if applicable) and agree that the record reflects my personal performance and is accurate and complete. 12/06/2021   I,Tinashe Williams,acting as a scribe for Nance Pear, NP.,have documented all relevant  documentation on the behalf of Nance Pear, NP,as directed by  Nance Pear, NP while in the presence of Nance Pear, NP.    Luiz Ochoa

## 2021-12-06 NOTE — Assessment & Plan Note (Signed)
Due for lipid panel.  Obtain follow up lipid panel. Continue atorvastatin.

## 2021-12-06 NOTE — Assessment & Plan Note (Signed)
BP Readings from Last 3 Encounters:  12/06/21 124/65  10/17/21 (!) 152/96  09/08/21 (!) 178/85   Looks good on hctz and lisinopril. Continue same.

## 2021-12-06 NOTE — Assessment & Plan Note (Signed)
She is interested in trying to quit smoking. Chantix starter month refill has been sent.

## 2021-12-06 NOTE — Assessment & Plan Note (Signed)
Improving following IM Gentamycin injection by her new Urologist. Has renal US and cystoscopy scheduled.

## 2021-12-22 ENCOUNTER — Encounter: Payer: Self-pay | Admitting: Orthopedic Surgery

## 2021-12-25 ENCOUNTER — Other Ambulatory Visit: Payer: Self-pay | Admitting: Surgical

## 2021-12-25 ENCOUNTER — Encounter: Payer: Self-pay | Admitting: Orthopedic Surgery

## 2021-12-25 MED ORDER — TRAMADOL HCL 50 MG PO TABS: 50.0000 mg | ORAL_TABLET | Freq: Every day | ORAL | 0 refills | Status: DC | PRN

## 2021-12-30 ENCOUNTER — Encounter: Payer: Self-pay | Admitting: Family

## 2021-12-31 ENCOUNTER — Other Ambulatory Visit: Payer: Self-pay | Admitting: Family

## 2021-12-31 NOTE — Telephone Encounter (Signed)
Can you please call her for a sooner appointment if we have a cancellation?

## 2022-01-01 NOTE — Telephone Encounter (Signed)
Appointment will be moved from 8/31 to 8/23

## 2022-01-03 ENCOUNTER — Ambulatory Visit (INDEPENDENT_AMBULATORY_CARE_PROVIDER_SITE_OTHER): Payer: Medicaid Other | Admitting: Family

## 2022-01-03 VITALS — BP 138/71 | HR 57 | Temp 98.7°F | Resp 16 | Wt 154.0 lb

## 2022-01-03 DIAGNOSIS — R1903 Right lower quadrant abdominal swelling, mass and lump: Secondary | ICD-10-CM | POA: Diagnosis not present

## 2022-01-03 NOTE — Progress Notes (Signed)
Subjective:     Patient ID: Dawn Lowery, female    DOB: 1950/11/23, 71 y.o.   MRN: [id number]  Chief Complaint  Patient presents with   Inguinal Hernia    Patient complains of bulging from right inguinal area.     HPI Patient is in today to discuss a right lower abdominal bulging that she noticed last weekend.  Notes that it has gone down some, but still doesn't feel right.  She had picked up some heavy water just before she noticed it and is concerned that she may have a hernia. Reports she is moving her bowels normally.   Health Maintenance Due  Topic Date Due   COLONOSCOPY (Pts 45-6yr Insurance coverage will need to be confirmed)  Never done   DEXA SCAN  Never done   COVID-19 Vaccine (3 - Moderna risk series) 03/20/2021   INFLUENZA VACCINE  12/12/2021    Past Medical History:  Diagnosis Date   Hypertension     Past Surgical History:  Procedure Laterality Date   ABDOMINAL HYSTERECTOMY     ELBOW SURGERY Right 2011   tennis elbow   FOOT SURGERY Right    big toe- pinning due to fracture   HAND SURGERY Left    thumb surgery    Family History  Problem Relation Age of Onset   Lung cancer Mother    Lung cancer Father    Obesity Sister    Hypertension Sister        GJossie Ng  Lung cancer Paternal Grandfather     Social History   Socioeconomic History   Marital status: Divorced    Spouse name: Not on file   Number of children: Not on file   Years of education: Not on file   Highest education level: Not on file  Occupational History   Not on file  Tobacco Use   Smoking status: Every Day    Packs/day: 0.50    Years: 20.00    Total pack years: 10.00    Types: Cigarettes   Smokeless tobacco: Never  Substance and Sexual Activity   Alcohol use: Not Currently   Drug use: Never   Sexual activity: Not Currently  Other Topics Concern   Not on file  Social History Narrative   Retired cEngineer, 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    2 dogs   Single   Completed HS   Social Determinants of Health   Financial Resource Strain: Low Risk  (03/09/2021)   Overall Financial Resource Strain (CARDIA)    Difficulty of Paying Living Expenses: Not hard at all  Food Insecurity: No Food Insecurity (03/09/2021)   Hunger Vital Sign    Worried About Running Out of Food in the Last Year: Never true    Ran Out of Food in the Last Year: Never true  Transportation Needs: No Transportation Needs (03/09/2021)   PRAPARE - THydrologist(Medical): No    Lack of Transportation (Non-Medical): No  Physical Activity: Inactive (03/09/2021)   Exercise Vital Sign    Days of Exercise per Week: 0 days    Minutes of Exercise per Session: 0 min  Stress: No Stress Concern Present (03/09/2021)   [place]   Feeling of Stress : Not at all  Social  Connections: Socially Isolated (03/09/2021)   Social Connection and Isolation Panel [NHANES]    Frequency of Communication with Friends and Family: More than three times a week    Frequency of Social Gatherings with Friends and Family: More than three times a week    Attends Religious Services: Never    Marine scientist or Organizations: No    Attends Archivist Meetings: Never    Marital Status: Divorced  Human resources officer Violence: Not At Risk (03/09/2021)   Humiliation, Afraid, Rape, and Kick questionnaire    Fear of Current or Ex-Partner: No    Emotionally Abused: No    Physically Abused: No    Sexually Abused: No    Outpatient Medications Prior to Visit  Medication Sig Dispense Refill   albuterol (VENTOLIN HFA) 108 (90 Base) MCG/ACT inhaler Inhale 2 puffs into the lungs every 6 (six) hours as needed for wheezing or shortness of breath. 8 g 0   aspirin 325 MG tablet Take by mouth daily.      atorvastatin (LIPITOR) 80 MG tablet Take 1 tablet by mouth once daily 90 tablet 0   estradiol (ESTRACE VAGINAL) 0.1 MG/GM vaginal cream Insert 2 g daily intravaginally for 2 weeks, followed by a maintenance dose of 1 g two 42.5 g 12   fluticasone-salmeterol (ADVAIR HFA) 115-21 MCG/ACT inhaler Inhale 2 puffs into the lungs 2 (two) times daily. 1 each 2   hydrochlorothiazide (HYDRODIURIL) 25 MG tablet Take 1 tablet by mouth once daily 90 tablet 0   lisinopril (ZESTRIL) 20 MG tablet Take 1 tablet by mouth once daily 90 tablet 0   potassium chloride SA (KLOR-CON M) 20 MEQ tablet Take 1 tablet (20 mEq total) by mouth daily. 90 tablet 1   traMADol (ULTRAM) 50 MG tablet Take 1 tablet (50 mg total) by mouth daily as needed. 30 tablet 0   varenicline (CHANTIX PAK) 0.5 MG X 11 & 1 MG X 42 tablet Take one 0.5 mg tablet by mouth once daily for 3 days, then increase to one 0.5 mg tablet twice daily for 4 days, then increase to one 1 mg tablet twice daily. 53 tablet 0   vitamin B-12 (CYANOCOBALAMIN) 1000 MCG tablet Take 1 tablet (1,000 mcg total) by mouth daily.     No facility-administered medications prior to visit.    Allergies  Allergen Reactions   Augmentin [Amoxicillin-Pot Clavulanate] Hives and Itching   Ciprofloxacin     ? Chest tightness   Keflex [Cephalexin]     itching   Macrobid [Nitrofurantoin]     ? Chest tightness   Morphine And Related Nausea And Vomiting   Sulfa Antibiotics Hives    ROS See HPI    Objective:    Physical Exam Constitutional:      Appearance: Normal appearance.  Pulmonary:     Effort: Pulmonary effort is normal.  Abdominal:     General: Abdomen is flat. Bowel sounds are normal.     Palpations: Abdomen is soft.     Tenderness: There is no abdominal tenderness.     Comments: + mass noted above the right hip bone. No significant tenderness noted. Unable to reduce this mass  Neurological:     Mental Status: She is alert.     BP 138/71 (BP Location: Right  Arm, Patient Position: Sitting, Cuff Size: Small)   Pulse (!) 57   Temp 98.7 F (37.1 C) (Oral)   Resp 16   Wt 154 lb (69.9 kg)  SpO2 100%   BMI 25.63 kg/m  Wt Readings from Last 3 Encounters:  01/03/22 154 lb (69.9 kg)  12/06/21 153 lb (69.4 kg)  09/08/21 155 lb 12.8 oz (70.7 kg)       Assessment & Plan:   Problem List Items Addressed This Visit       Unprioritized   Right lower quadrant abdominal mass - Primary    New.  Possible RIH.  Will obtain US for further evaluation. We discussed signs/symptoms of incarcerated hernia and she understands to go to the ER if this occurs.      Relevant Orders   US Abdomen Complete    I am having Nobie Putnam. Vorhees "[name]" maintain her aspirin, cyanocobalamin, potassium chloride SA, Advair HFA, albuterol, estradiol, atorvastatin, varenicline, traMADol, lisinopril, and hydrochlorothiazide.  No orders of the defined types were placed in this encounter.

## 2022-01-03 NOTE — Assessment & Plan Note (Signed)
New.  Possible RIH.  Will obtain US for further evaluation. We discussed signs/symptoms of incarcerated hernia and she understands to go to the ER if this occurs.

## 2022-01-06 ENCOUNTER — Ambulatory Visit (HOSPITAL_BASED_OUTPATIENT_CLINIC_OR_DEPARTMENT_OTHER)
Admission: RE | Admit: 2022-01-06 | Discharge: 2022-01-06 | Disposition: A | Discharge status: Home / Self Care | Payer: Medicaid Other | Source: Ambulatory Visit | Attending: Family | Admitting: Family

## 2022-01-06 DIAGNOSIS — R1903 Right lower quadrant abdominal swelling, mass and lump: Secondary | ICD-10-CM | POA: Diagnosis not present

## 2022-01-08 ENCOUNTER — Telehealth: Payer: Self-pay | Admitting: Family

## 2022-01-08 DIAGNOSIS — K4090 Unilateral inguinal hernia, without obstruction or gangrene, not specified as recurrent: Secondary | ICD-10-CM

## 2022-01-08 NOTE — Telephone Encounter (Signed)
Please advise pt that her ultrasound does show a likely hernia.  I would like for her to meet with a general surgeon to further discuss.

## 2022-01-10 ENCOUNTER — Other Ambulatory Visit: Payer: Self-pay | Admitting: Family

## 2022-01-11 ENCOUNTER — Ambulatory Visit: Payer: Medicaid Other | Admitting: Family

## 2022-01-12 ENCOUNTER — Encounter: Payer: Self-pay | Admitting: Family

## 2022-01-12 ENCOUNTER — Ambulatory Visit (INDEPENDENT_AMBULATORY_CARE_PROVIDER_SITE_OTHER): Payer: Medicaid Other | Admitting: Family

## 2022-01-12 VITALS — BP 119/80 | HR 71 | Temp 98.0°F | Resp 16 | Wt 150.0 lb

## 2022-01-12 DIAGNOSIS — K529 Noninfective gastroenteritis and colitis, unspecified: Secondary | ICD-10-CM | POA: Diagnosis not present

## 2022-01-12 DIAGNOSIS — N390 Urinary tract infection, site not specified: Secondary | ICD-10-CM | POA: Diagnosis not present

## 2022-01-12 DIAGNOSIS — K439 Ventral hernia without obstruction or gangrene: Secondary | ICD-10-CM | POA: Diagnosis not present

## 2022-01-12 DIAGNOSIS — Z720 Tobacco use: Secondary | ICD-10-CM

## 2022-01-12 DIAGNOSIS — K921 Melena: Secondary | ICD-10-CM

## 2022-01-12 LAB — CBC WITH DIFFERENTIAL/PLATELET
Basophils Absolute: 0 10*3/uL (ref 0.0–0.1)
Basophils Relative: 0.6 % (ref 0.0–3.0)
Eosinophils Absolute: 0.1 10*3/uL (ref 0.0–0.7)
Eosinophils Relative: 2.1 % (ref 0.0–5.0)
HCT: 39.4 % (ref 36.0–46.0)
Hemoglobin: 13.6 g/dL (ref 12.0–15.0)
Lymphocytes Relative: 35.9 % (ref 12.0–46.0)
Lymphs Abs: 2.1 10*3/uL (ref 0.7–4.0)
MCHC: 34.5 g/dL (ref 30.0–36.0)
MCV: 91.3 fl (ref 78.0–100.0)
Monocytes Absolute: 0.5 10*3/uL (ref 0.1–1.0)
Monocytes Relative: 9.3 % (ref 3.0–12.0)
Neutro Abs: 3 10*3/uL (ref 1.4–7.7)
Neutrophils Relative %: 52.1 % (ref 43.0–77.0)
Platelets: 233 10*3/uL (ref 150.0–400.0)
RBC: 4.31 Mil/uL (ref 3.87–5.11)
RDW: 13.6 % (ref 11.5–15.5)
WBC: 5.8 10*3/uL (ref 4.0–10.5)

## 2022-01-12 LAB — COMPREHENSIVE METABOLIC PANEL
ALT: 23 U/L (ref 0–35)
AST: 36 U/L (ref 0–37)
Albumin: 4 g/dL (ref 3.5–5.2)
Alkaline Phosphatase: 69 U/L (ref 39–117)
BUN: 5 mg/dL — ABNORMAL LOW (ref 6–23)
CO2: 28 mEq/L (ref 19–32)
Calcium: 9.5 mg/dL (ref 8.4–10.5)
Chloride: 101 mEq/L (ref 96–112)
Creatinine, Ser: 0.79 mg/dL (ref 0.40–1.20)
GFR: 75.42 mL/min (ref >60.00)
Glucose, Bld: 80 mg/dL (ref 70–99)
Potassium: 4 mEq/L (ref 3.5–5.1)
Sodium: 139 mEq/L (ref 135–145)
Total Bilirubin: 0.8 mg/dL (ref 0.2–1.2)
Total Protein: 6.6 g/dL (ref 6.0–8.3)

## 2022-01-12 NOTE — Assessment & Plan Note (Signed)
Currently stable- management per urology.

## 2022-01-12 NOTE — Progress Notes (Signed)
Subjective:     Patient ID: Dawn Lowery, female    DOB: [birth year], 71 y.o.   MRN: [id number]  Chief Complaint  Patient presents with   Follow-up    Follow up after er visit    HPI Patient is in today for ED follow up. She presented to [facility] 01/07/22 with c/o of abdominal pain. She also had emesis/hematochezia. Her CT noted the following:   1. Long segment colitis from the distal descending through the sigmoid colon. No pneumatosis, abscess or free air. 2. Upper abdominal findings of gastroenteritis, and in the lower abdomen, a 10 cm in length feces filled mildly thickened distal ileal segment, just proximal to the terminal 7 cm of the ileum which is unremarkable. Etiology could be due to a prior episode of ileitis with residual abnormality in this portion of the small bowel. There is no masslike wall thickening and there is no upstream small bowel dilatation. 3. Small amount of pelvic ascites tracking from the left paracolic gutter. No free air. No abscess. 4. Small right femoral hernia containing a 3.3 cm homogeneous thin walled fluid collection, without inflammatory changes. Small umbilical and inguinal fat hernias. 5. Cystitis versus bladder nondistention. 6. Small left renal cysts and additional bilateral tiny cortical hypodensities which are too small to characterize. No follow-up imaging is recommended.JACR 2018 Feb; 264-273, Management of the Incidental Renal mass on CT, [employer] 2021; 814-848, Bosniak Classification of Cystic Renal Masses, Version 2019. 7. Aortic and coronary artery atherosclerosis.  She reports that her hernia has enlarged. She feels like her stomach is distended.  She has not had a bm since her ED visit but notes no further hematochezia. She is tolerating PO's.   She was rx'd with metronidazole/cipro and prn zofran by the ED. She continues to have some nausea. Zofran does help her.  She stopped chantix as she felt that this is making her  more nauseous. Plans to quit cold [place].   Health Maintenance Due  Topic Date Due   COLONOSCOPY (Pts 45-76yrs Insurance coverage will need to be confirmed)  Never done   DEXA SCAN  Never done   COVID-19 Vaccine (3 - Moderna risk series) 03/20/2021   INFLUENZA VACCINE  12/12/2021    Past Medical History:  Diagnosis Date   Hypertension     Past Surgical History:  Procedure Laterality Date   ABDOMINAL HYSTERECTOMY     ELBOW SURGERY Right 2011   tennis elbow   FOOT SURGERY Right    big toe- pinning due to fracture   HAND SURGERY Left    thumb surgery    Family History  Problem Relation Age of Onset   Lung cancer Mother    Lung cancer Father    Obesity Sister    Hypertension Sister        Karlene Lineman   Lung cancer Paternal Grandfather     Social History   Socioeconomic History   Marital status: Divorced    Spouse name: Not on file   Number of children: Not on file   Years of education: Not on file   Highest education level: Not on file  Occupational History   Not on file  Tobacco Use   Smoking status: Every Day    Packs/day: 0.50    Years: 20.00    Total pack years: 10.00    Types: Cigarettes   Smokeless tobacco: Never  Substance and Sexual Activity   Alcohol use: Not Currently   Drug use: Never  Sexual activity: Not Currently  Other Topics Concern   Not on file  Social History Narrative   Retired Engineer, structural   Son- lives in [place]   Daughter lives locally   5 grandchildren and 1 great grandson   Enjoys spending time with her daughter and relaxing.    2 dogs   Single   Completed HS   Social Determinants of Health   Financial Resource Strain: Low Risk  (03/09/2021)   Overall Financial Resource Strain (CARDIA)    Difficulty of Paying Living Expenses: Not hard at all  Food Insecurity: No Food Insecurity (03/09/2021)   Hunger Vital Sign    Worried About Running Out of Food in the Last Year: Never true    Ran Out of Food in the Last Year:  Never true  Transportation Needs: No Transportation Needs (03/09/2021)   PRAPARE - Hydrologist (Medical): No    Lack of Transportation (Non-Medical): No  Physical Activity: Inactive (03/09/2021)   Exercise Vital Sign    Days of Exercise per Week: 0 days    Minutes of Exercise per Session: 0 min  Stress: No Stress Concern Present (03/09/2021)   [place]    Feeling of Stress : Not at all  Social Connections: Socially Isolated (03/09/2021)   Social Connection and Isolation Panel [NHANES]    Frequency of Communication with Friends and Family: More than three times a week    Frequency of Social Gatherings with Friends and Family: More than three times a week    Attends Religious Services: Never    Marine scientist or Organizations: No    Attends Archivist Meetings: Never    Marital Status: Divorced  Human resources officer Violence: Not At Risk (03/09/2021)   Humiliation, Afraid, Rape, and Kick questionnaire    Fear of Current or Ex-Partner: No    Emotionally Abused: No    Physically Abused: No    Sexually Abused: No    Outpatient Medications Prior to Visit  Medication Sig Dispense Refill   albuterol (VENTOLIN HFA) 108 (90 Base) MCG/ACT inhaler Inhale 2 puffs into the lungs every 6 (six) hours as needed for wheezing or shortness of breath. 8 g 0   aspirin 325 MG tablet Take by mouth daily.     atorvastatin (LIPITOR) 80 MG tablet Take 1 tablet by mouth once daily 90 tablet 0   ciprofloxacin (CIPRO) 500 MG tablet Take 500 mg by mouth 2 (two) times daily.     estradiol (ESTRACE VAGINAL) 0.1 MG/GM vaginal cream Insert 2 g daily intravaginally for 2 weeks, followed by a maintenance dose of 1 g two 42.5 g 12   fluticasone-salmeterol (ADVAIR HFA) 115-21 MCG/ACT inhaler Inhale 2 puffs into the lungs 2 (two) times daily. 1 each 2   hydrochlorothiazide (HYDRODIURIL) 25 MG tablet Take 1  tablet by mouth once daily 90 tablet 0   KLOR-CON M20 20 MEQ tablet Take 1 tablet by mouth once daily 90 tablet 0   lisinopril (ZESTRIL) 20 MG tablet Take 1 tablet by mouth once daily 90 tablet 0   metroNIDAZOLE (FLAGYL) 500 MG tablet Take 500 mg by mouth 2 (two) times daily.     traMADol (ULTRAM) 50 MG tablet Take 1 tablet (50 mg total) by mouth daily as needed. 30 tablet 0   varenicline (CHANTIX PAK) 0.5 MG X 11 & 1 MG X 42 tablet Take one 0.5 mg tablet by mouth  once daily for 3 days, then increase to one 0.5 mg tablet twice daily for 4 days, then increase to one 1 mg tablet twice daily. 53 tablet 0   vitamin B-12 (CYANOCOBALAMIN) 1000 MCG tablet Take 1 tablet (1,000 mcg total) by mouth daily.     No facility-administered medications prior to visit.    Allergies  Allergen Reactions   Augmentin [Amoxicillin-Pot Clavulanate] Hives and Itching   Ciprofloxacin     ? Chest tightness   Keflex [Cephalexin]     itching   Macrobid [Nitrofurantoin]     ? Chest tightness   Morphine And Related Nausea And Vomiting   Sulfa Antibiotics Hives    ROS See HPI    Objective:    Physical Exam Constitutional:      General: She is not in acute distress.    Appearance: Normal appearance. She is well-developed.  HENT:     Head: Normocephalic and atraumatic.     Right Ear: External ear normal.     Left Ear: External ear normal.  Eyes:     General: No scleral icterus. Neck:     Thyroid: No thyromegaly.  Cardiovascular:     Rate and Rhythm: Normal rate and regular rhythm.     Heart sounds: Normal heart sounds. No murmur heard. Pulmonary:     Effort: Pulmonary effort is normal. No respiratory distress.     Breath sounds: Normal breath sounds. No wheezing.  Abdominal:     Palpations: Abdomen is soft.     Comments: RLQ ventral hernia, firm, tender, not reducible  Musculoskeletal:     Cervical back: Neck supple.  Skin:    General: Skin is warm and dry.  Neurological:     Mental Status: She  is alert and oriented to person, place, and time.  Psychiatric:        Mood and Affect: Mood normal.        Behavior: Behavior normal.        Thought Content: Thought content normal.        Judgment: Judgment normal.     BP 119/80 (BP Location: Right Arm, Patient Position: Sitting, Cuff Size: Small)   Pulse 71   Temp 98 F (36.7 C) (Oral)   Resp 16   Wt 150 lb (68 kg)   SpO2 99%   BMI 24.96 kg/m  Wt Readings from Last 3 Encounters:  01/12/22 150 lb (68 kg)  01/03/22 154 lb (69.9 kg)  12/06/21 153 lb (69.4 kg)       Assessment & Plan:   Problem List Items Addressed This Visit       Unprioritized   Ventral hernia without obstruction or gangrene - Primary    Will refer to general surgery.        Relevant Orders   Ambulatory referral to General Surgery   Tobacco abuse    Encouraged cessation.       Recurrent UTI    Currently stable- management per urology.       Relevant Medications   metroNIDAZOLE (FLAGYL) 500 MG tablet   Hematochezia    Resolved. Will refer to GI given recent gastroenteritis/ileitis findings.       Relevant Orders   Ambulatory referral to Gastroenterology   CBC with Differential/Platelet   Gastroenteritis    Clinically improving. I encouraged her to complete the full 7 day course of cipro/flagyl.  In the meantime, she will let me know if her symptoms do not continue to improve.  Other Visit Diagnoses     Ileitis       Relevant Orders   Comp Met (CMET)       I am having Dawn Putnam. [place] "[name]" maintain her aspirin, cyanocobalamin, Advair HFA, albuterol, estradiol, atorvastatin, varenicline, traMADol, lisinopril, hydrochlorothiazide, K[place]-Con M20, ciprofloxacin, and metroNIDAZOLE.  No orders of the defined types were placed in this encounter.

## 2022-01-12 NOTE — Assessment & Plan Note (Signed)
Resolved. Will refer to GI given recent gastroenteritis/ileitis findings.

## 2022-01-12 NOTE — Assessment & Plan Note (Signed)
Encouraged cessation.

## 2022-01-12 NOTE — Assessment & Plan Note (Signed)
Will refer to general surgery.

## 2022-01-12 NOTE — Assessment & Plan Note (Signed)
Clinically improving. I encouraged her to complete the full 7 day course of cipro/flagyl.  In the meantime, she will let me know if her symptoms do not continue to improve.

## 2022-01-14 MED ORDER — ONDANSETRON HCL 4 MG PO TABS: 4.0000 mg | ORAL_TABLET | Freq: Three times a day (TID) | ORAL | 1 refills | Status: DC | PRN

## 2022-01-17 ENCOUNTER — Encounter: Payer: Self-pay | Admitting: Family

## 2022-01-30 ENCOUNTER — Other Ambulatory Visit: Payer: Self-pay | Admitting: Surgical

## 2022-01-30 MED ORDER — TRAMADOL HCL 50 MG PO TABS: 50.0000 mg | ORAL_TABLET | Freq: Every day | ORAL | 0 refills | Status: DC | PRN

## 2022-01-30 NOTE — Telephone Encounter (Signed)
Refilled prescription 

## 2022-02-06 ENCOUNTER — Telehealth (INDEPENDENT_AMBULATORY_CARE_PROVIDER_SITE_OTHER): Payer: Medicaid Other | Admitting: Family

## 2022-02-06 ENCOUNTER — Telehealth: Payer: Self-pay | Admitting: Family

## 2022-02-06 DIAGNOSIS — J441 Chronic obstructive pulmonary disease with (acute) exacerbation: Secondary | ICD-10-CM | POA: Diagnosis not present

## 2022-02-06 MED ORDER — PREDNISONE 10 MG PO TABS: ORAL_TABLET | ORAL | 0 refills | Status: DC

## 2022-02-06 MED ORDER — BENZONATATE 100 MG PO CAPS: 100.0000 mg | ORAL_CAPSULE | Freq: Three times a day (TID) | ORAL | 0 refills | Status: DC | PRN

## 2022-02-06 NOTE — Telephone Encounter (Signed)
Patient states she is not feeling well and would like medication to be sent to her pharmacy, advised patient to set up an appt but she stated she did not have the energy for one. Please advise.

## 2022-02-06 NOTE — Progress Notes (Signed)
MyChart Video Visit    Virtual Visit via Video Note   This visit type was conducted due to national recommendations for restrictions regarding the COVID-19 Pandemic (e.g. social distancing) in an effort to limit this patient's exposure and mitigate transmission in our community. This patient is at least at moderate risk for complications without adequate follow up. This format is felt to be most appropriate for this patient at this time. Physical exam was limited by quality of the video and audio technology used for the visit. CMA was able to get the patient set up on a video visit.  Patient location: Home Patient and provider in visit Provider location: Office  I discussed the limitations of evaluation and management by telemedicine and the availability of in person appointments. The patient expressed understanding and agreed to proceed.  Visit Date: 02/06/2022  Today's healthcare provider: Nance Pear, NP     Subjective:    Patient ID: Dawn Lowery, female    DOB: [birth year], 71 y.o.   MRN: [id number]  Chief Complaint  Patient presents with   Cough    Complains of persistent cough, had negative covid test    HPI Patient is in today for virtual office visit.   Symptoms: Patient complains of cough and congestion that started on the night of 02/05/2022. She reports SOB and wheezing that started the morning of 02/06/2022. She took Albuterol today for symptoms. She reports she tested negative for COVID-19 at home. She is requesting a cough medicine. She took albuterol today for symptoms.   Surgery: She reports she is scheduled for a hernia surgery this on 02/09/2022.   Medication: She does not use Advair.   Past Medical History:  Diagnosis Date   Hypertension     Past Surgical History:  Procedure Laterality Date   ABDOMINAL HYSTERECTOMY     ELBOW SURGERY Right 2011   tennis elbow   FOOT SURGERY Right    big toe- pinning due to fracture   HAND SURGERY Left     thumb surgery    Family History  Problem Relation Age of Onset   Lung cancer Mother    Lung cancer Father    Obesity Sister    Hypertension Sister        [name]   Lung cancer Paternal Grandfather     Social History   Socioeconomic History   Marital status: Divorced    Spouse name: Not on file   Number of children: Not on file   Years of education: Not on file   Highest education level: Not on file  Occupational History   Not on file  Tobacco Use   Smoking status: Every Day    Packs/day: 0.50    Years: 20.00    Total pack years: 10.00    Types: Cigarettes   Smokeless tobacco: Never  Substance and Sexual Activity   Alcohol use: Not Currently   Drug use: Never   Sexual activity: Not Currently  Other Topics Concern   Not on file  Social History Narrative   Retired Engineer, structural   Son- lives in [place]   Daughter lives locally   5 grandchildren and 1 great grandson   Enjoys spending time with her daughter and relaxing.    2 dogs   Single   Completed HS   Social Determinants of Health   Financial Resource Strain: Low Risk  (03/09/2021)   Overall Financial Resource Strain (CARDIA)    Difficulty of Paying Living  Expenses: Not hard at all  Food Insecurity: No Food Insecurity (03/09/2021)   Hunger Vital Sign    Worried About Running Out of Food in the Last Year: Never true    Ran Out of Food in the Last Year: Never true  Transportation Needs: No Transportation Needs (03/09/2021)   PRAPARE - Hydrologist (Medical): No    Lack of Transportation (Non-Medical): No  Physical Activity: Inactive (03/09/2021)   Exercise Vital Sign    Days of Exercise per Week: 0 days    Minutes of Exercise per Session: 0 min  Stress: No Stress Concern Present (03/09/2021)   [place]    Feeling of Stress : Not at all  Social Connections: Socially Isolated (03/09/2021)   Social  Connection and Isolation Panel [NHANES]    Frequency of Communication with Friends and Family: More than three times a week    Frequency of Social Gatherings with Friends and Family: More than three times a week    Attends Religious Services: Never    Marine scientist or Organizations: No    Attends Archivist Meetings: Never    Marital Status: Divorced  Human resources officer Violence: Not At Risk (03/09/2021)   Humiliation, Afraid, Rape, and Kick questionnaire    Fear of Current or Ex-Partner: No    Emotionally Abused: No    Physically Abused: No    Sexually Abused: No    Outpatient Medications Prior to Visit  Medication Sig Dispense Refill   albuterol (VENTOLIN HFA) 108 (90 Base) MCG/ACT inhaler Inhale 2 puffs into the lungs every 6 (six) hours as needed for wheezing or shortness of breath. 8 g 0   aspirin 325 MG tablet Take by mouth daily.     atorvastatin (LIPITOR) 80 MG tablet Take 1 tablet by mouth once daily 90 tablet 0   ciprofloxacin (CIPRO) 500 MG tablet Take 500 mg by mouth 2 (two) times daily.     estradiol (ESTRACE VAGINAL) 0.1 MG/GM vaginal cream Insert 2 g daily intravaginally for 2 weeks, followed by a maintenance dose of 1 g two 42.5 g 12   hydrochlorothiazide (HYDRODIURIL) 25 MG tablet Take 1 tablet by mouth once daily 90 tablet 0   KLOR-CON M20 20 MEQ tablet Take 1 tablet by mouth once daily 90 tablet 0   lisinopril (ZESTRIL) 20 MG tablet Take 1 tablet by mouth once daily 90 tablet 0   metroNIDAZOLE (FLAGYL) 500 MG tablet Take 500 mg by mouth 2 (two) times daily.     ondansetron (ZOFRAN) 4 MG tablet Take 1 tablet (4 mg total) by mouth every 8 (eight) hours as needed for nausea or vomiting. 30 tablet 1   traMADol (ULTRAM) 50 MG tablet Take 1 tablet (50 mg total) by mouth daily as needed. 30 tablet 0   varenicline (CHANTIX PAK) 0.5 MG X 11 & 1 MG X 42 tablet Take one 0.5 mg tablet by mouth once daily for 3 days, then increase to one 0.5 mg tablet twice daily  for 4 days, then increase to one 1 mg tablet twice daily. 53 tablet 0   vitamin B-12 (CYANOCOBALAMIN) 1000 MCG tablet Take 1 tablet (1,000 mcg total) by mouth daily.     fluticasone-salmeterol (ADVAIR HFA) 115-21 MCG/ACT inhaler Inhale 2 puffs into the lungs 2 (two) times daily. 1 each 2   No facility-administered medications prior to visit.    Allergies  Allergen Reactions  Augmentin [Amoxicillin-Pot Clavulanate] Hives and Itching   Ciprofloxacin     ? Chest tightness   Keflex [Cephalexin]     itching   Macrobid [Nitrofurantoin]     ? Chest tightness   Morphine And Related Nausea And Vomiting   Sulfa Antibiotics Hives    Review of Systems  HENT:  Positive for congestion.   Respiratory:  Positive for cough, shortness of breath and wheezing.        Objective:    Physical Exam  There were no vitals taken for this visit. Wt Readings from Last 3 Encounters:  01/12/22 150 lb (68 kg)  01/03/22 154 lb (69.9 kg)  12/06/21 153 lb (69.4 kg)    Gen: Awake, alert, laying in bed-no acute distress but appears tired Resp: Breathing is even and non-labored Psych: calm/pleasant demeanor Neuro: Alert and Oriented x 3, + facial symmetry, speech is clear.     Assessment & Plan:   Problem List Items Addressed This Visit       Unprioritized   COPD exacerbation ([place]) - Primary    New. Likely exacerbated by viral illness.  Will rx with prednisone taper, albuterol 2 puffs every 6 hours as needed and tessalon as needed for cough. Initial covid testing is negative, but I did advise her to retest in about 3 days. If symptoms worsen or fail to improve, we discussed need for in office visit. Pt verbalizes understanding.       Relevant Medications   predniSONE (DELTASONE) 10 MG tablet   benzonatate (TESSALON) 100 MG capsule     Meds ordered this encounter  Medications   predniSONE (DELTASONE) 10 MG tablet    Sig: 4 tabs by mouth once daily for 2 days, then 3 tabs daily x 2 days, then  2 tabs daily x 2 days, then 1 tab daily x 2 days    Dispense:  20 tablet    Refill:  0    Order Specific Question:   Supervising Provider    Answer:   Penni Homans A [[id number]]   benzonatate (TESSALON) 100 MG capsule    Sig: Take 1 capsule (100 mg total) by mouth 3 (three) times daily as needed.    Dispense:  30 capsule    Refill:  0    Order Specific Question:   Supervising Provider    Answer:   Penni Homans A [[id number]]    I discussed the assessment and treatment plan with the patient. The patient was provided an opportunity to ask questions and all were answered. The patient agreed with the plan and demonstrated an understanding of the instructions.   The patient was advised to call back or seek an in-person evaluation if the symptoms worsen or if the condition fails to improve as anticipated.  I provided 20 minutes of face-to-face time during this encounter.   I,Amber Collins,acting as a Education administrator for [employer], NP.,have documented all relevant documentation on the behalf of Nance Pear, NP,as directed by  Nance Pear, NP while in the presence of Nance Pear, NP.   Nance Pear, NP [employer] [facility] [employer] [phone] (phone) [phone] (fax)  [place]

## 2022-02-06 NOTE — Assessment & Plan Note (Signed)
New. Likely exacerbated by viral illness.  Will rx with prednisone taper, albuterol 2 puffs every 6 hours as needed and tessalon as needed for cough. Initial covid testing is negative, but I did advise her to retest in about 3 days. If symptoms worsen or fail to improve, we discussed need for in office visit. Pt verbalizes understanding.

## 2022-02-06 NOTE — Telephone Encounter (Signed)
Patient scheduled for vv at 6 pm today

## 2022-02-11 HISTORY — PX: UMBILICAL HERNIA REPAIR: SHX196

## 2022-02-11 HISTORY — PX: FEMORAL HERNIA REPAIR: SUR1179

## 2022-02-22 ENCOUNTER — Encounter: Payer: Self-pay | Admitting: Family

## 2022-02-23 ENCOUNTER — Encounter: Payer: Self-pay | Admitting: Family

## 2022-02-26 ENCOUNTER — Encounter: Payer: Self-pay | Admitting: Family

## 2022-02-27 ENCOUNTER — Other Ambulatory Visit: Payer: Self-pay | Admitting: Family

## 2022-02-27 ENCOUNTER — Ambulatory Visit: Payer: Medicaid Other | Admitting: Gastroenterology

## 2022-03-05 ENCOUNTER — Other Ambulatory Visit: Payer: Self-pay

## 2022-03-05 MED ORDER — TRAMADOL HCL 50 MG PO TABS: 50.0000 mg | ORAL_TABLET | Freq: Every day | ORAL | 1 refills | Status: DC | PRN

## 2022-03-05 NOTE — Telephone Encounter (Signed)
Ok to rf x 1 # 20 then will need rov for more refills thx

## 2022-03-06 ENCOUNTER — Ambulatory Visit (INDEPENDENT_AMBULATORY_CARE_PROVIDER_SITE_OTHER): Payer: Medicaid Other

## 2022-03-06 DIAGNOSIS — Z23 Encounter for immunization: Secondary | ICD-10-CM

## 2022-03-06 NOTE — Addendum Note (Signed)
Addended by: Laure Kidney on: 03/06/2022 10:33 AM   Modules accepted: Orders

## 2022-03-06 NOTE — Progress Notes (Signed)
Pt here for High Doze Influenza Shot   Pt did Well Right  Deltoid

## 2022-03-12 ENCOUNTER — Ambulatory Visit (INDEPENDENT_AMBULATORY_CARE_PROVIDER_SITE_OTHER): Payer: Medicaid Other | Admitting: *Deleted

## 2022-03-12 DIAGNOSIS — Z0000 Encounter for general adult medical examination without abnormal findings: Secondary | ICD-10-CM

## 2022-03-12 NOTE — Progress Notes (Signed)
Subjective:   Dawn Lowery is a 71 y.o. female who presents for Medicare Annual (Subsequent) preventive examination.  I connected with  Dawn Lowery on 03/12/22 by a audio enabled telemedicine application and verified that I am speaking with the correct person using two identifiers.  Patient Location: Home  Provider Location: Office/Clinic  I discussed the limitations of evaluation and management by telemedicine. The patient expressed understanding and agreed to proceed.   Review of Systems    Defer to PCP Cardiac Risk Factors include: dyslipidemia;hypertension;advanced age (>55mn, >>72women)     Objective:    There were no vitals filed for this visit. There is no height or weight on file to calculate BMI.     03/12/2022    9:02 AM 05/06/2021    9:26 AM 03/09/2021    9:07 AM 12/12/2017    3:19 PM  Advanced Directives  Does Patient Have a Medical Advance Directive? No No No Yes  Type of AScientist, research (medical)Living will  Would patient like information on creating a medical advance directive? No - Patient declined No - Patient declined No - Patient declined     Current Medications (verified) Outpatient Encounter Medications as of 03/12/2022  Medication Sig   albuterol (VENTOLIN HFA) 108 (90 Base) MCG/ACT inhaler Inhale 2 puffs into the lungs every 6 (six) hours as needed for wheezing or shortness of breath.   aspirin 325 MG tablet Take by mouth daily.   atorvastatin (LIPITOR) 80 MG tablet Take 1 tablet (80 mg total) by mouth daily.   benzonatate (TESSALON) 100 MG capsule Take 1 capsule (100 mg total) by mouth 3 (three) times daily as needed.   ciprofloxacin (CIPRO) 500 MG tablet Take 500 mg by mouth 2 (two) times daily.   estradiol (ESTRACE VAGINAL) 0.1 MG/GM vaginal cream Insert 2 g daily intravaginally for 2 weeks, followed by a maintenance dose of 1 g two   hydrochlorothiazide (HYDRODIURIL) 25 MG tablet Take 1 tablet by mouth once daily    KLOR-CON M20 20 MEQ tablet Take 1 tablet by mouth once daily   lisinopril (ZESTRIL) 20 MG tablet Take 1 tablet (20 mg total) by mouth daily.   metroNIDAZOLE (FLAGYL) 500 MG tablet Take 500 mg by mouth 2 (two) times daily.   ondansetron (ZOFRAN) 4 MG tablet Take 1 tablet (4 mg total) by mouth every 8 (eight) hours as needed for nausea or vomiting.   predniSONE (DELTASONE) 10 MG tablet 4 tabs by mouth once daily for 2 days, then 3 tabs daily x 2 days, then 2 tabs daily x 2 days, then 1 tab daily x 2 days   traMADol (ULTRAM) 50 MG tablet Take 1 tablet (50 mg total) by mouth daily as needed.   varenicline (CHANTIX PAK) 0.5 MG X 11 & 1 MG X 42 tablet Take one 0.5 mg tablet by mouth once daily for 3 days, then increase to one 0.5 mg tablet twice daily for 4 days, then increase to one 1 mg tablet twice daily.   vitamin B-12 (CYANOCOBALAMIN) 1000 MCG tablet Take 1 tablet (1,000 mcg total) by mouth daily.   No facility-administered encounter medications on file as of 03/12/2022.    Allergies (verified) Augmentin [amoxicillin-pot clavulanate], Ciprofloxacin, Keflex [cephalexin], Macrobid [nitrofurantoin], Morphine and related, and Sulfa antibiotics   History: Past Medical History:  Diagnosis Date   [place]   Hypertension    Past Surgical History:  Procedure Laterality  Date   ABDOMINAL HYSTERECTOMY     ELBOW SURGERY Right 2011   tennis elbow   FOOT SURGERY Right    big toe- pinning due to fracture   HAND SURGERY Left    thumb surgery   Family History  Problem Relation Age of Onset   Lung cancer Mother    Lung cancer Father    Obesity Sister    Hypertension Sister        [name]   Lung cancer Paternal Grandfather    Social History   Socioeconomic History   Marital status: Divorced    Spouse name: Not on file   Number of children: Not on file   Years of education: Not on file   Highest education level: Not on file  Occupational History    Not on file  Tobacco Use   Smoking status: Every Day    Packs/day: 0.50    Years: 20.00    Total pack years: 10.00    Types: Cigarettes   Smokeless tobacco: Never  Substance and Sexual Activity   Alcohol use: Not Currently   Drug use: Never   Sexual activity: Not Currently  Other Topics Concern   Not on file  Social History Narrative   Retired Engineer, structural   Son- lives in [place]   Daughter lives locally   5 grandchildren and 1 great grandson   Enjoys spending time with her daughter and relaxing.    2 dogs   Single   Completed HS   Social Determinants of Health   Financial Resource Strain: Low Risk  (03/09/2021)   Overall Financial Resource Strain (CARDIA)    Difficulty of Paying Living Expenses: Not hard at all  Food Insecurity: No Food Insecurity (03/09/2021)   Hunger Vital Sign    Worried About Running Out of Food in the Last Year: Never true    Ran Out of Food in the Last Year: Never true  Transportation Needs: No Transportation Needs (03/09/2021)   PRAPARE - Hydrologist (Medical): No    Lack of Transportation (Non-Medical): No  Physical Activity: Inactive (03/09/2021)   Exercise Vital Sign    Days of Exercise per Week: 0 days    Minutes of Exercise per Session: 0 min  Stress: No Stress Concern Present (03/09/2021)   [place]    Feeling of Stress : Not at all  Social Connections: Socially Isolated (03/09/2021)   Social Connection and Isolation Panel [NHANES]    Frequency of Communication with Friends and Family: More than three times a week    Frequency of Social Gatherings with Friends and Family: More than three times a week    Attends Religious Services: Never    Marine scientist or Organizations: No    Attends Music therapist: Never    Marital Status: Divorced    Tobacco Counseling Ready to quit: Not Answered Counseling given: Not  Answered   Clinical Intake:  Pre-visit preparation completed: Yes  Pain : No/denies pain  Diabetes: No  How often do you need to have someone help you when you read instructions, pamphlets, or other written materials from your doctor or pharmacy?: 1 - Never   Activities of Daily Living    03/12/2022    9:04 AM  In your present state of health, do you have any difficulty performing the following activities:  Hearing? 0  Vision? 0  Difficulty concentrating or making  decisions? 0  Walking or climbing stairs? 0  Dressing or bathing? 0  Doing errands, shopping? 0  Preparing Food and eating ? N  Using the Toilet? N  In the past six months, have you accidently leaked urine? Y  Comment slight stress incontinence  Do you have problems with loss of bowel control? N  Managing your Medications? N  Managing your Finances? N  Housekeeping or managing your Housekeeping? N    Patient Care Team: [name], NP as PCP - General (Internal Medicine)  Indicate any recent Medical Services you may have received from other than [facility] providers in the past year (date may be approximate).     Assessment:   This is a routine wellness examination for Shada.  Hearing/Vision screen No results found.  Dietary issues and exercise activities discussed: Current Exercise Habits: The patient does not participate in regular exercise at present, Exercise limited by: None identified   Goals Addressed   None    Depression Screen    03/12/2022    9:04 AM 03/09/2021    9:10 AM 03/06/2021    9:55 AM 01/29/2020    2:09 PM  PHQ 2/9 Scores  PHQ - 2 Score 0 0 0 0  PHQ- 9 Score    0    Fall Risk    03/12/2022    9:02 AM 03/09/2021    9:09 AM  Fall Risk   Falls in the past year? 1 0  Number falls in past yr: 1 0  Injury with Fall? 0 0  Risk for fall due to : History of fall(s)   Follow up Falls evaluation completed Falls prevention discussed    [place]:  Any stairs in or around the home? Yes  If so, are there any without handrails? No  Home free of loose throw rugs in walkways, pet beds, electrical cords, etc? Yes  Adequate lighting in your home to reduce risk of falls? Yes   ASSISTIVE DEVICES UTILIZED TO PREVENT FALLS:  Life alert? No  Use of a cane, walker or w/c? No  Grab bars in the bathroom? No  Shower chair or bench in shower? No  Elevated toilet seat or a handicapped toilet? No   TIMED UP AND GO:  Was the test performed?  No, audio visit .   Cognitive Function:        03/12/2022    9:09 AM  6CIT Screen  What Year? 0 points  What month? 0 points  What time? 0 points  Count back from 20 0 points  Months in reverse 2 points  Repeat phrase 0 points  Total Score 2 points    Immunizations Immunization History  Administered Date(s) Administered   Fluad Quad(high Dose 65+) 03/06/2021, 03/06/2022   Moderna SARS-COV2 Booster Vaccination 08/23/2020   Moderna Sars-Covid-2 Vaccination 01/29/2020, 02/26/2020   PNEUMOCOCCAL CONJUGATE-20 12/06/2021   Pfizer Covid-19 Vaccine Bivalent Booster 13yr & up 02/20/2021   PCroswellSars-cov-2 Pediatric Vaccine(642moto <5y43yr10/02/2021   Pneumococcal Polysaccharide-23 09/02/2020   Tdap 09/02/2020   Zoster Recombinat (Shingrix) 09/02/2020, 02/02/2021    TDAP status: Up to date  Flu Vaccine status: Up to date  Pneumococcal vaccine status: Up to date  Covid-19 vaccine status: Information provided on how to obtain vaccines.   Qualifies for Shingles Vaccine? Yes   Zostavax completed No   Shingrix Completed?: Yes  Screening Tests Health Maintenance  Topic Date Due   COLONOSCOPY (Pts 45-49y63yrsurance coverage will  need to be confirmed)  Never done   DEXA SCAN  Never done   COVID-19 Vaccine (3 - Moderna risk series) 03/20/2021   Medicare Annual Wellness (AWV)  03/09/2022   MAMMOGRAM  10/12/2022   TETANUS/TDAP  09/03/2030   Pneumonia Vaccine 43+ Years old   Completed   INFLUENZA VACCINE  Completed   Zoster Vaccines- Shingrix  Completed   HPV VACCINES  Aged Out   Hepatitis C Screening  Discontinued    Health Maintenance  Health Maintenance Due  Topic Date Due   COLONOSCOPY (Pts 45-24yr Insurance coverage will need to be confirmed)  Never done   DEXA SCAN  Never done   COVID-19 Vaccine (3 - Moderna risk series) 03/20/2021   Medicare Annual Wellness (AWV)  03/09/2022    Colorectal cancer screening: Referral to GI placed 01/12/22. Pt aware the office will call re: appt.  Mammogram status: Completed 10/11/20. Repeat every year  Bone Density status: Ordered 12/07/21. Pt provided with contact info and advised to call to schedule appt.  Lung Cancer Screening: (Low Dose CT Chest recommended if Age 71-80years, 30 pack-year currently smoking OR have quit w/in 15years.) does not qualify.   Lung Cancer Screening Referral: N/a  Additional Screening:  Hepatitis C Screening: does qualify; Completed N/a  Vision Screening: Recommended annual ophthalmology exams for early detection of glaucoma and other disorders of the eye. Is the patient up to date with their annual eye exam?  No  Who is the provider or what is the name of the office in which the patient attends annual eye exams? Dr. AMarcial PacasIf pt is not established with a provider, would they like to be referred to a provider to establish care? No .   Dental Screening: Recommended annual dental exams for proper oral hygiene  Community Resource Referral / Chronic Care Management: CRR required this visit?  No   CCM required this visit?  No      Plan:     I have personally reviewed and noted the following in the patient's chart:   Medical and social history Use of alcohol, tobacco or illicit drugs  Current medications and supplements including opioid prescriptions. Patient is currently taking opioid prescriptions. Information provided to patient regarding non-opioid alternatives.  Patient advised to discuss non-opioid treatment plan with their provider. Functional ability and status Nutritional status Physical activity Advanced directives List of other physicians Hospitalizations, surgeries, and ER visits in previous 12 months Vitals Screenings to include cognitive, depression, and falls Referrals and appointments  In addition, I have reviewed and discussed with patient certain preventive protocols, quality metrics, and best practice recommendations. A written personalized care plan for preventive services as well as general preventive health recommendations were provided to patient.   Due to this being a telephonic visit, the after visit summary with patients personalized plan was offered to patient via mail or my-chart. Patient would like to access on my-chart.  BBeatris Ship CSt. [place]  03/12/2022   Nurse Notes: None

## 2022-03-12 NOTE — Patient Instructions (Signed)
Dawn Lowery , Thank you for taking time to come for your Medicare Wellness Visit. I appreciate your ongoing commitment to your health goals. Please review the following plan we discussed and let me know if I can assist you in the future.   These are the goals we discussed:  Goals      Patient Stated     Would like to walk more        This is a list of the screening recommended for you and due dates:  Health Maintenance  Topic Date Due   Colon Cancer Screening  Never done   DEXA scan (bone density measurement)  Never done   COVID-19 Vaccine (3 - Moderna risk series) 03/20/2021   Mammogram  10/12/2022   Medicare Annual Wellness Visit  03/13/2023   Tetanus Vaccine  09/03/2030   Pneumonia Vaccine  Completed   Flu Shot  Completed   Zoster (Shingles) Vaccine  Completed   HPV Vaccine  Aged Out   Hepatitis C Screening: USPSTF Recommendation to screen - Ages 39-79 yo.  Discontinued     Next appointment: Follow up in one year for your annual wellness visit    Preventive Care 65 Years and Older, Female Preventive care refers to lifestyle choices and visits with your health care provider that can promote health and wellness. What does preventive care include? A yearly physical exam. This is also called an annual well check. Dental exams once or twice a year. Routine eye exams. Ask your health care provider how often you should have your eyes checked. Personal lifestyle choices, including: Daily care of your teeth and gums. Regular physical activity. Eating a healthy diet. Avoiding tobacco and drug use. Limiting alcohol use. Practicing safe sex. Taking low-dose aspirin every day. Taking vitamin and mineral supplements as recommended by your health care provider. What happens during an annual well check? The services and screenings done by your health care provider during your annual well check will depend on your age, overall health, lifestyle risk factors, and family history of  disease. Counseling  Your health care provider may ask you questions about your: Alcohol use. Tobacco use. Drug use. Emotional well-being. Home and relationship well-being. Sexual activity. Eating habits. History of falls. Memory and ability to understand (cognition). Work and work Statistician. Reproductive health. Screening  You may have the following tests or measurements: Height, weight, and BMI. Blood pressure. Lipid and cholesterol levels. These may be checked every 5 years, or more frequently if you are over 15 years old. Skin check. Lung cancer screening. You may have this screening every year starting at age 39 if you have a 30-pack-year history of smoking and currently smoke or have quit within the past 15 years. Fecal occult blood test (FOBT) of the stool. You may have this test every year starting at age 38. Flexible sigmoidoscopy or colonoscopy. You may have a sigmoidoscopy every 5 years or a colonoscopy every 10 years starting at age 70. Hepatitis C blood test. Hepatitis B blood test. Sexually transmitted disease (STD) testing. Diabetes screening. This is done by checking your blood sugar (glucose) after you have not eaten for a while (fasting). You may have this done every 1-3 years. Bone density scan. This is done to screen for osteoporosis. You may have this done starting at age 1. Mammogram. This may be done every 1-2 years. Talk to your health care provider about how often you should have regular mammograms. Talk with your health care provider about your test  results, treatment options, and if necessary, the need for more tests. Vaccines  Your health care provider may recommend certain vaccines, such as: Influenza vaccine. This is recommended every year. Tetanus, diphtheria, and acellular pertussis (Tdap, Td) vaccine. You may need a Td booster every 10 years. Zoster vaccine. You may need this after age 36. Pneumococcal 13-valent conjugate (PCV13) vaccine. One  dose is recommended after age 18. Pneumococcal polysaccharide (PPSV23) vaccine. One dose is recommended after age 60. Talk to your health care provider about which screenings and vaccines you need and how often you need them. This information is not intended to replace advice given to you by your health care provider. Make sure you discuss any questions you have with your health care provider. Document Released: 05/27/2015 Document Revised: 01/18/2016 Document Reviewed: 03/01/2015 Elsevier Interactive Patient Education  2017 [place] Prevention in the Home Falls can cause injuries. They can happen to people of all ages. There are many things you can do to make your home safe and to help prevent falls. What can I do on the outside of my home? Regularly fix the edges of walkways and driveways and fix any cracks. Remove anything that might make you trip as you walk through a door, such as a raised step or threshold. Trim any bushes or trees on the path to your home. Use bright outdoor lighting. Clear any walking paths of anything that might make someone trip, such as rocks or tools. Regularly check to see if handrails are loose or broken. Make sure that both sides of any steps have handrails. Any raised decks and porches should have guardrails on the edges. Have any leaves, snow, or ice cleared regularly. Use sand or salt on walking paths during winter. Clean up any spills in your garage right away. This includes oil or grease spills. What can I do in the bathroom? Use night lights. Install grab bars by the toilet and in the tub and shower. Do not use towel bars as grab bars. Use non-skid mats or decals in the tub or shower. If you need to sit down in the shower, use a plastic, non-slip stool. Keep the floor dry. Clean up any water that spills on the floor as soon as it happens. Remove soap buildup in the tub or shower regularly. Attach bath mats securely with double-sided  non-slip rug tape. Do not have throw rugs and other things on the floor that can make you trip. What can I do in the bedroom? Use night lights. Make sure that you have a light by your bed that is easy to reach. Do not use any sheets or blankets that are too big for your bed. They should not hang down onto the floor. Have a firm chair that has side arms. You can use this for support while you get dressed. Do not have throw rugs and other things on the floor that can make you trip. What can I do in the kitchen? Clean up any spills right away. Avoid walking on wet floors. Keep items that you use a lot in easy-to-reach places. If you need to reach something above you, use a strong step stool that has a grab bar. Keep electrical cords out of the way. Do not use floor polish or wax that makes floors slippery. If you must use wax, use non-skid floor wax. Do not have throw rugs and other things on the floor that can make you trip. What can I do with my stairs?  Do not leave any items on the stairs. Make sure that there are handrails on both sides of the stairs and use them. Fix handrails that are broken or loose. Make sure that handrails are as long as the stairways. Check any carpeting to make sure that it is firmly attached to the stairs. Fix any carpet that is loose or worn. Avoid having throw rugs at the top or bottom of the stairs. If you do have throw rugs, attach them to the floor with carpet tape. Make sure that you have a light switch at the top of the stairs and the bottom of the stairs. If you do not have them, ask someone to add them for you. What else can I do to help prevent falls? Wear shoes that: Do not have high heels. Have rubber bottoms. Are comfortable and fit you well. Are closed at the toe. Do not wear sandals. If you use a stepladder: Make sure that it is fully opened. Do not climb a closed stepladder. Make sure that both sides of the stepladder are locked into place. Ask  someone to hold it for you, if possible. Clearly mark and make sure that you can see: Any grab bars or handrails. First and last steps. Where the edge of each step is. Use tools that help you move around (mobility aids) if they are needed. These include: Canes. Walkers. Scooters. Crutches. Turn on the lights when you go into a dark area. Replace any light bulbs as soon as they burn out. Set up your furniture so you have a clear path. Avoid moving your furniture around. If any of your floors are uneven, fix them. If there are any pets around you, be aware of where they are. Review your medicines with your doctor. Some medicines can make you feel dizzy. This can increase your chance of falling. Ask your doctor what other things that you can do to help prevent falls. This information is not intended to replace advice given to you by your health care provider. Make sure you discuss any questions you have with your health care provider. Document Released: 02/24/2009 Document Revised: 10/06/2015 Document Reviewed: 06/04/2014 Elsevier Interactive Patient Education  2017 [employer].

## 2022-03-15 ENCOUNTER — Encounter: Payer: Self-pay | Admitting: Family

## 2022-03-22 ENCOUNTER — Other Ambulatory Visit: Payer: Self-pay | Admitting: Family

## 2022-03-23 ENCOUNTER — Encounter: Payer: Self-pay | Admitting: Family

## 2022-04-02 ENCOUNTER — Other Ambulatory Visit: Payer: Self-pay | Admitting: Family

## 2022-04-03 ENCOUNTER — Encounter: Payer: Self-pay | Admitting: Orthopedic Surgery

## 2022-04-03 NOTE — Telephone Encounter (Signed)
Per Dr Randel Pigg last message, needs return office visit before we can refill Tramadol

## 2022-04-08 ENCOUNTER — Telehealth: Payer: Medicaid Other | Admitting: Nurse Practitioner

## 2022-04-08 DIAGNOSIS — J208 Acute bronchitis due to other specified organisms: Secondary | ICD-10-CM

## 2022-04-08 MED ORDER — PREDNISONE 10 MG PO TABS: ORAL_TABLET | ORAL | 0 refills | Status: DC

## 2022-04-08 MED ORDER — DEXTROMETHORPHAN-GUAIFENESIN 10-100 MG/5ML PO LIQD: 10.0000 mL | ORAL | 0 refills | Status: DC | PRN

## 2022-04-08 NOTE — Progress Notes (Signed)
I have spent 5 minutes in review of e-visit questionnaire, review and updating patient chart, medical decision making and response to patient.  ° °Jerrell Mangel W Secilia Apps, NP ° °  °

## 2022-04-08 NOTE — Progress Notes (Signed)
We are sorry that you are not feeling well.  Here is how we plan to help!  Based on your presentation I believe you most likely have A cough due to a virus.  This is called viral bronchitis and is best treated by rest, plenty of fluids and control of the cough.  You may use Ibuprofen or Tylenol as directed to help your symptoms.     In addition you may use a tussin cough syrup I have sent. Unfortunately we do not prescribe tussionex or hycodan cough syrups.   I have also sent Prednisone 10 mg daily for 6 days (see taper instructions below)  Directions for 6 day taper: Day 1: 2 tablets before breakfast, 1 after both lunch & dinner and 2 at bedtime Day 2: 1 tab before breakfast, 1 after both lunch & dinner and 2 at bedtime Day 3: 1 tab at each meal & 1 at bedtime Day 4: 1 tab at breakfast, 1 at lunch, 1 at bedtime Day 5: 1 tab at breakfast & 1 tab at bedtime Day 6: 1 tab at breakfast  From your responses in the eVisit questionnaire you describe inflammation in the upper respiratory tract which is causing a significant cough.  This is commonly called Bronchitis and has four common causes:   Allergies Viral Infections Acid Reflux Bacterial Infection Allergies, viruses and acid reflux are treated by controlling symptoms or eliminating the cause. An example might be a cough caused by taking certain blood pressure medications. You stop the cough by changing the medication. Another example might be a cough caused by acid reflux. Controlling the reflux helps control the cough.  USE OF BRONCHODILATOR ("RESCUE") INHALERS: There is a risk from using your bronchodilator too frequently.  The risk is that over-reliance on a medication which only relaxes the muscles surrounding the breathing tubes can reduce the effectiveness of medications prescribed to reduce swelling and congestion of the tubes themselves.  Although you feel brief relief from the bronchodilator inhaler, your asthma may actually be  worsening with the tubes becoming more swollen and filled with mucus.  This can delay other crucial treatments, such as oral steroid medications. If you need to use a bronchodilator inhaler daily, several times per day, you should discuss this with your provider.  There are probably better treatments that could be used to keep your asthma under control.     HOME CARE Only take medications as instructed by your medical team. Complete the entire course of an antibiotic. Drink plenty of fluids and get plenty of rest. Avoid close contacts especially the very young and the elderly Cover your mouth if you cough or cough into your sleeve. Always remember to wash your hands A steam or ultrasonic humidifier can help congestion.   GET HELP RIGHT AWAY IF: You develop worsening fever. You become short of breath You cough up blood. Your symptoms persist after you have completed your treatment plan MAKE SURE YOU  Understand these instructions. Will watch your condition. Will get help right away if you are not doing well or get worse.    Thank you for choosing an e-visit.  Your e-visit answers were reviewed by a board certified advanced clinical practitioner to complete your personal care plan. Depending upon the condition, your plan could have included both over the counter or prescription medications.  Please review your pharmacy choice. Make sure the pharmacy is open so you can pick up prescription now. If there is a problem, you may contact your  provider through [employer] and have the prescription routed to another pharmacy.  Your safety is important to [name]. If you have drug allergies check your prescription carefully.   For the next 24 hours you can use MyChart to ask questions about today's visit, request a non-urgent call back, or ask for a work or school excuse. You will get an email in the next two days asking about your experience. I hope that your e-visit has been valuable and will  speed your recovery.

## 2022-04-10 ENCOUNTER — Encounter: Payer: Self-pay | Admitting: Gastroenterology

## 2022-04-10 ENCOUNTER — Ambulatory Visit: Payer: Medicaid Other | Admitting: Gastroenterology

## 2022-04-10 VITALS — BP 158/78 | HR 84 | Ht 65.0 in | Wt 148.0 lb

## 2022-04-10 DIAGNOSIS — K921 Melena: Secondary | ICD-10-CM

## 2022-04-10 NOTE — Progress Notes (Signed)
Referring Provider: [name], NP Primary Care Physician:  [name], NP  Reason for Consultation: Hematochezia   IMPRESSION:  Recent colitis presenting with hematochezia    - symptoms have now resolved    - had similar, but more mild symptoms in her 61s    - no extra-GI manifestations of IBD Abnormal thickening in the ileum and colon on CT 12/2021 Recent hernia repair No prior colonoscopy  PLAN: Colonoscopy   HPI: Dawn Lowery is a 71 y.o. female referred by NP Inda Castle for hematochezia.  The history is obtained through the patient review of her electronic health record.  She has a history of COPD, hypertension, recurrent cystitis and a stroke in January 2022.    She was seen in the ED in August for colitis involving the descending and sigmoid colon presenting with hematochezia. During this, she developed a painful mass to her right groin that slowly migrated closer to her pubis. A CT A/P 01/07/22 revealed a mass in the right groin containing fluid and a possible right femoral hernia as well as a 6 mm fat containing umbilical hernia, colitis involving the descending and sigmoid colon, and a 10cm area of thickening in the distal ileum. She had robotic repair of a right femoral hernia and right obturator hernia 02/23/2022 at [facility]. No post-op complications.  No further bleeding since her visit to the ED. Bowel habits have returned to normal and she is having a bowel movement daily. GI ROS is largely negative although her weight has gone down slightly.   Normal CBC 01/2022 including hemoglobin 13.6, MCV 91.3, RDW 13.6, platelets 233  No prior colonoscopy.  She had an episode of colitis presenting with similar but more mild symptoms in her 30s.  There is no known family history of colon cancer or polyps. No family history of stomach cancer or other GI malignancy. No family history of inflammatory bowel disease or celiac.    Past Medical History:   Diagnosis Date   Colitis    CVA (cerebral vascular accident) ([place]) 05/2020   Hypertension     Past Surgical History:  Procedure Laterality Date   ABDOMINAL HYSTERECTOMY     ELBOW SURGERY Right 2011   tennis elbow   FEMORAL HERNIA REPAIR  02/2022   FOOT SURGERY Right    big toe- pinning due to fracture   HAND SURGERY Left    thumb surgery   UMBILICAL HERNIA REPAIR  02/2022      Current Outpatient Medications  Medication Sig Dispense Refill   albuterol (VENTOLIN HFA) 108 (90 Base) MCG/ACT inhaler Inhale 2 puffs into the lungs every 6 (six) hours as needed for wheezing or shortness of breath. 8 g 0   aspirin EC 81 MG tablet Take 81 mg by mouth daily. Swallow whole.     atorvastatin (LIPITOR) 80 MG tablet Take 1 tablet (80 mg total) by mouth daily. 90 tablet 0   benzonatate (TESSALON) 100 MG capsule Take 1 capsule (100 mg total) by mouth 3 (three) times daily as needed. 30 capsule 0   dextromethorphan-guaiFENesin (TUSSIN DM) 10-100 MG/5ML liquid Take 10 mLs by mouth every 4 (four) hours as needed for cough. 240 mL 0   estradiol (ESTRACE VAGINAL) 0.1 MG/GM vaginal cream Insert 2 g daily intravaginally for 2 weeks, followed by a maintenance dose of 1 g two 42.5 g 12   hydrochlorothiazide (HYDRODIURIL) 25 MG tablet Take 1 tablet by mouth once daily 90 tablet 0   lisinopril (ZESTRIL) 20  MG tablet Take 1 tablet (20 mg total) by mouth daily. 90 tablet 0   potassium chloride SA (KLOR-CON M) 20 MEQ tablet Take 1 tablet by mouth once daily 90 tablet 0   predniSONE (DELTASONE) 10 MG tablet Directions for 6 day taper: Day 1: 2 tablets before breakfast, 1 after both lunch & dinner and 2 at bedtime Day 2: 1 tab before breakfast, 1 after both lunch & dinner and 2 at bedtime Day 3: 1 tab at each meal & 1 at bedtime Day 4: 1 tab at breakfast, 1 at lunch, 1 at bedtime Day 5: 1 tab at breakfast & 1 tab at bedtime Day 6: 1 tab at breakfast 21 tablet 0   traMADol (ULTRAM) 50 MG tablet Take 1 tablet (50 mg  total) by mouth daily as needed. 20 tablet 1   vitamin B-12 (CYANOCOBALAMIN) 1000 MCG tablet Take 1 tablet (1,000 mcg total) by mouth daily.     No current facility-administered medications for this visit.    Allergies as of 04/10/2022 - Review Complete 04/10/2022  Allergen Reaction Noted   Augmentin [amoxicillin-pot clavulanate] Hives and Itching 06/13/2021   Ciprofloxacin  07/18/2021   Keflex [cephalexin]  06/21/2021   Macrobid [nitrofurantoin]  07/18/2021   Morphine and related Nausea And Vomiting 06/06/2020   Sulfa antibiotics Hives 12/12/2017    Family History  Problem Relation Age of Onset   Lung cancer Mother    Lung cancer Father    Obesity Sister    Hypertension Sister        [name]   Lung cancer Paternal Grandfather     Social History   Socioeconomic History   Marital status: Divorced    Spouse name: Not on file   Number of children: Not on file   Years of education: Not on file   Highest education level: Not on file  Occupational History   Not on file  Tobacco Use   Smoking status: Every Day    Packs/day: 0.50    Years: 20.00    Total pack years: 10.00    Types: Cigarettes   Smokeless tobacco: Never  Substance and Sexual Activity   Alcohol use: Not Currently   Drug use: Never   Sexual activity: Not Currently  Other Topics Concern   Not on file  Social History Narrative   Retired Engineer, structural   Son- lives in [place]   Daughter lives locally   5 grandchildren and 1 great grandson   Enjoys spending time with her daughter and relaxing.    2 dogs   Single   Completed HS   Social Determinants of Health   Financial Resource Strain: Low Risk  (03/09/2021)   Overall Financial Resource Strain (CARDIA)    Difficulty of Paying Living Expenses: Not hard at all  Food Insecurity: No Food Insecurity (03/09/2021)   Hunger Vital Sign    Worried About Running Out of Food in the Last Year: Never true    Ran Out of Food in the Last Year: Never true   Transportation Needs: No Transportation Needs (03/09/2021)   PRAPARE - Hydrologist (Medical): No    Lack of Transportation (Non-Medical): No  Physical Activity: Inactive (03/09/2021)   Exercise Vital Sign    Days of Exercise per Week: 0 days    Minutes of Exercise per Session: 0 min  Stress: No Stress Concern Present (03/09/2021)   [place]  Feeling of Stress : Not at all  Social Connections: Socially Isolated (03/09/2021)   Social Connection and Isolation Panel [NHANES]    Frequency of Communication with Friends and Family: More than three times a week    Frequency of Social Gatherings with Friends and Family: More than three times a week    Attends Religious Services: Never    Marine scientist or Organizations: No    Attends Archivist Meetings: Never    Marital Status: Divorced  Human resources officer Violence: Not At Risk (03/09/2021)   Humiliation, Afraid, Rape, and Kick questionnaire    Fear of Current or Ex-Partner: No    Emotionally Abused: No    Physically Abused: No    Sexually Abused: No    Review of Systems: 12 system ROS is negative except as noted above with the addition of insomnia.   Physical Exam: General:   Alert,  well-nourished, pleasant and cooperative in NAD Head:  Normocephalic and atraumatic. Eyes:  Sclera clear, no icterus.   Conjunctiva pink. Ears:  Normal auditory acuity. Nose:  No deformity, discharge,  or lesions. Mouth:  No deformity or lesions.   Neck:  Supple; no masses or thyromegaly. Lungs:  Clear throughout to auscultation.   No wheezes. Heart:  Regular rate and rhythm; no murmurs. Abdomen:  Soft, thin, nontender, nondistended, normal bowel sounds, no rebound or guarding. No hepatosplenomegaly.   Rectal:  Deferred  Msk:  Symmetrical. No boney deformities LAD: No inguinal or umbilical LAD Extremities:  No clubbing or  edema. Neurologic:  Alert and  oriented x4;  grossly nonfocal Skin:  Intact without significant lesions or rashes. Psych:  Alert and cooperative. Normal mood and affect.    Kiah Vanalstine L. Tarri Glenn, MD, MPH 04/10/2022, 10:59 AM

## 2022-04-10 NOTE — Patient Instructions (Addendum)
It was a pleasure to meet you today.  We discussed a colonoscopy.   You have been scheduled for a colonoscopy. Please follow written instructions given to you at your visit today.  Please pick up your prep supplies at the pharmacy within the next 1-3 days. If you use inhalers (even only as needed), please bring them with you on the day of your procedure.  Tips for colonoscopy:  - Stay well hydrated for 3-4 days prior to the exam. This reduces nausea and dehydration.  - To prevent skin/hemorrhoid irritation - prior to wiping, put A&Dointment or vaseline on the toilet paper. - Keep a towel or pad on the bed.  - Drink  64oz of clear liquids in the morning of prep day (prior to starting the prep) to be sure that there is enough fluid to flush the colon and stay hydrated!!!! This is in addition to the fluids required for preparation. - Use of a flavored hard candy, such as grape Anise Salvo, can counteract some of the flavor of the prep and may prevent some nausea.    _______________________________________________________  If you are age 52 or older, your body mass index should be between 23-30. Your Body mass index is 24.63 kg/m. If this is out of the aforementioned range listed, please consider follow up with your Primary Care Provider.  If you are age 60 or younger, your body mass index should be between 19-25. Your Body mass index is 24.63 kg/m. If this is out of the aformentioned range listed, please consider follow up with your Primary Care Provider.   ________________________________________________________  The [facility] providers would like to encourage you to use [facility] to communicate with providers for non-urgent requests or questions.  Due to long hold times on the telephone, sending your provider a message by [facility] may be a faster and more efficient way to get a response.  Please allow 48 business hours for a response.  Please remember that this is for non-urgent requests.   _______________________________________________________

## 2022-04-13 ENCOUNTER — Ambulatory Visit (INDEPENDENT_AMBULATORY_CARE_PROVIDER_SITE_OTHER): Payer: Medicaid Other | Admitting: Family

## 2022-04-13 ENCOUNTER — Encounter: Payer: Self-pay | Admitting: Family

## 2022-04-13 ENCOUNTER — Other Ambulatory Visit: Payer: Self-pay | Admitting: Family

## 2022-04-13 ENCOUNTER — Ambulatory Visit (HOSPITAL_BASED_OUTPATIENT_CLINIC_OR_DEPARTMENT_OTHER)
Admission: RE | Admit: 2022-04-13 | Discharge: 2022-04-13 | Disposition: A | Discharge status: Home / Self Care | Payer: Medicaid Other | Source: Ambulatory Visit | Attending: Family | Admitting: Family

## 2022-04-13 VITALS — BP 148/84 | HR 85 | Temp 98.3°F | Resp 16 | Wt 148.0 lb

## 2022-04-13 DIAGNOSIS — J40 Bronchitis, not specified as acute or chronic: Secondary | ICD-10-CM | POA: Diagnosis not present

## 2022-04-13 DIAGNOSIS — J208 Acute bronchitis due to other specified organisms: Secondary | ICD-10-CM

## 2022-04-13 DIAGNOSIS — J441 Chronic obstructive pulmonary disease with (acute) exacerbation: Secondary | ICD-10-CM | POA: Diagnosis not present

## 2022-04-13 MED ORDER — ALBUTEROL SULFATE HFA 108 (90 BASE) MCG/ACT IN AERS: 2.0000 | INHALATION_SPRAY | Freq: Four times a day (QID) | RESPIRATORY_TRACT | 3 refills | Status: DC | PRN

## 2022-04-13 MED ORDER — PREDNISONE 10 MG PO TABS: ORAL_TABLET | ORAL | 0 refills | Status: DC

## 2022-04-13 NOTE — Patient Instructions (Signed)
Restart Lisinopril. Continue prednisone '20mg'$  once daily for the next 3 days.

## 2022-04-13 NOTE — Progress Notes (Signed)
Subjective:     Patient ID: Dawn Lowery, female    DOB: Oct 06, 1950, 71 y.o.   MRN: [id number]  Chief Complaint  Patient presents with   Hypertension    Here for follow up   Bronchitis    Diagnosed with bronchitis last week, still wheezing and chest tightness. Using inhaler.     Hypertension    Hyperlipidemia-  Lab Results  Component Value Date   CHOL 136 12/06/2021   HDL 50.40 12/06/2021   LDLCALC 66 12/06/2021   TRIG 97.0 12/06/2021   CHOLHDL 3 12/06/2021   Maintained on atorvastatin '80mg'$ .    HTN- bp meds include hctz '25mg'$  and lisinopril '20mg'$ .  BP Readings from Last 3 Encounters:  04/13/22 (!) 148/84  04/10/22 (!) 158/78  01/12/22 119/80   Had E-visit on 11/26 and was prescribed a 6 day prednisone taper and Tussin DM.  She noted some improvement with prednisone until more recently.  Not sleeping well on the prednisone.  Nots that her symptoms seem to be worsening. Cough is productive. She started wheezing on Wednesday.   Health Maintenance Due  Topic Date Due   COLONOSCOPY (Pts 45-55yr Insurance coverage will need to be confirmed)  Never done   DEXA SCAN  Never done   COVID-19 Vaccine (3 - Moderna risk series) 03/20/2021    Past Medical History:  Diagnosis Date   Colitis    [place]   Hypertension     Past Surgical History:  Procedure Laterality Date   ABDOMINAL HYSTERECTOMY     ELBOW SURGERY Right 2011   tennis elbow   FEMORAL HERNIA REPAIR  02/2022   FOOT SURGERY Right    big toe- pinning due to fracture   HAND SURGERY Left    thumb surgery   UMBILICAL HERNIA REPAIR  02/2022    Family History  Problem Relation Age of Onset   Lung cancer Mother    Lung cancer Father    Obesity Sister    Hypertension Sister        GJossie Ng  Lung cancer Paternal Grandfather     Social History   Socioeconomic History   Marital status: Divorced    Spouse name: Not on file   Number of children: Not on file    Years of education: Not on file   Highest education level: Not on file  Occupational History   Not on file  Tobacco Use   Smoking status: Every Day    Packs/day: 0.50    Years: 20.00    Total pack years: 10.00    Types: Cigarettes   Smokeless tobacco: Never  Substance and Sexual Activity   Alcohol use: Not Currently   Drug use: Never   Sexual activity: Not Currently  Other Topics Concern   Not on file  Social History Narrative   Retired cEngineer, 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    2 dogs   Single   Completed HS   Social Determinants of Health   Financial Resource Strain: Low Risk  (03/09/2021)   Overall Financial Resource Strain (CARDIA)    Difficulty of Paying Living Expenses: Not hard at all  Food Insecurity: No Food Insecurity (03/09/2021)   Hunger Vital Sign    Worried About Running Out of Food in the Last Year: Never true  Ran Out of Food in the Last Year: Never true  Transportation Needs: No Transportation Needs (03/09/2021)   PRAPARE - Hydrologist (Medical): No    Lack of Transportation (Non-Medical): No  Physical Activity: Inactive (03/09/2021)   Exercise Vital Sign    Days of Exercise per Week: 0 days    Minutes of Exercise per Session: 0 min  Stress: No Stress Concern Present (03/09/2021)   [place]    Feeling of Stress : Not at all  Social Connections: Socially Isolated (03/09/2021)   Social Connection and Isolation Panel [NHANES]    Frequency of Communication with Friends and Family: More than three times a week    Frequency of Social Gatherings with Friends and Family: More than three times a week    Attends Religious Services: Never    Marine scientist or Organizations: No    Attends Archivist Meetings: Never    Marital  Status: Divorced  Human resources officer Violence: Not At Risk (03/09/2021)   Humiliation, Afraid, Rape, and Kick questionnaire    Fear of Current or Ex-Partner: No    Emotionally Abused: No    Physically Abused: No    Sexually Abused: No    Outpatient Medications Prior to Visit  Medication Sig Dispense Refill   aspirin EC 81 MG tablet Take 81 mg by mouth daily. Swallow whole.     atorvastatin (LIPITOR) 80 MG tablet Take 1 tablet (80 mg total) by mouth daily. 90 tablet 0   estradiol (ESTRACE VAGINAL) 0.1 MG/GM vaginal cream Insert 2 g daily intravaginally for 2 weeks, followed by a maintenance dose of 1 g two 42.5 g 12   hydrochlorothiazide (HYDRODIURIL) 25 MG tablet Take 1 tablet by mouth once daily 90 tablet 0   lisinopril (ZESTRIL) 20 MG tablet Take 1 tablet (20 mg total) by mouth daily. 90 tablet 0   potassium chloride SA (KLOR-CON M) 20 MEQ tablet Take 1 tablet by mouth once daily 90 tablet 0   vitamin B-12 (CYANOCOBALAMIN) 1000 MCG tablet Take 1 tablet (1,000 mcg total) by mouth daily.     albuterol (VENTOLIN HFA) 108 (90 Base) MCG/ACT inhaler Inhale 2 puffs into the lungs every 6 (six) hours as needed for wheezing or shortness of breath. 8 g 0   predniSONE (DELTASONE) 10 MG tablet Directions for 6 day taper: Day 1: 2 tablets before breakfast, 1 after both lunch & dinner and 2 at bedtime Day 2: 1 tab before breakfast, 1 after both lunch & dinner and 2 at bedtime Day 3: 1 tab at each meal & 1 at bedtime Day 4: 1 tab at breakfast, 1 at lunch, 1 at bedtime Day 5: 1 tab at breakfast & 1 tab at bedtime Day 6: 1 tab at breakfast 21 tablet 0   benzonatate (TESSALON) 100 MG capsule Take 1 capsule (100 mg total) by mouth 3 (three) times daily as needed. 30 capsule 0   dextromethorphan-guaiFENesin (TUSSIN DM) 10-100 MG/5ML liquid Take 10 mLs by mouth every 4 (four) hours as needed for cough. 240 mL 0   traMADol (ULTRAM) 50 MG tablet Take 1 tablet (50 mg total) by mouth daily as needed. 20 tablet 1   No  facility-administered medications prior to visit.    Allergies  Allergen Reactions   Augmentin [Amoxicillin-Pot Clavulanate] Hives and Itching   Ciprofloxacin     ? Chest tightness   Keflex [Cephalexin]  itching   Macrobid [Nitrofurantoin]     ? Chest tightness   Morphine And Related Nausea And Vomiting   Sulfa Antibiotics Hives    ROS See HPI    Objective:    Physical Exam Constitutional:      General: She is not in acute distress.    Appearance: Normal appearance. She is well-developed.  HENT:     Head: Normocephalic and atraumatic.     Right Ear: External ear normal.     Left Ear: External ear normal.  Eyes:     General: No scleral icterus. Neck:     Thyroid: No thyromegaly.  Cardiovascular:     Rate and Rhythm: Normal rate and regular rhythm.     Heart sounds: Normal heart sounds. No murmur heard. Pulmonary:     Effort: Pulmonary effort is normal. No respiratory distress.     Breath sounds: Normal breath sounds. No wheezing.  Musculoskeletal:     Cervical back: Neck supple.  Skin:    General: Skin is warm and dry.  Neurological:     Mental Status: She is alert and oriented to person, place, and time.  Psychiatric:        Mood and Affect: Mood normal.        Behavior: Behavior normal.        Thought Content: Thought content normal.        Judgment: Judgment normal.     BP (!) 148/84   Pulse 85   Temp 98.3 F (36.8 C) (Oral)   Resp 16   Wt 148 lb (67.1 kg)   SpO2 99%   BMI 24.63 kg/m  Wt Readings from Last 3 Encounters:  04/13/22 148 lb (67.1 kg)  04/10/22 148 lb (67.1 kg)  01/12/22 150 lb (68 kg)       Assessment & Plan:   Problem List Items Addressed This Visit       Unprioritized   COPD exacerbation ([place])    Will continue prednisone for 3 more days and obtain CXR to rule out PNA. Continue albuterol every 6 hours as needed. Refill has been sent.       Relevant Medications   predniSONE (DELTASONE) 10 MG tablet   albuterol  (VENTOLIN HFA) 108 (90 Base) MCG/ACT inhaler   Other Visit Diagnoses     Bronchitis    -  Primary   Relevant Orders   DG Chest 2 View   Viral bronchitis       Relevant Medications   predniSONE (DELTASONE) 10 MG tablet       I have discontinued Nobie Putnam. Bera "[name]"'s benzonatate, traMADol, and dextromethorphan-guaiFENesin. I have also changed her predniSONE. Additionally, I am having her maintain her cyanocobalamin, estradiol, lisinopril, atorvastatin, hydrochlorothiazide, potassium chloride SA, aspirin EC, and albuterol.  Meds ordered this encounter  Medications   predniSONE (DELTASONE) 10 MG tablet    Sig: Take 2 tabs by mouth once daily for 3 more days.    Dispense:  7 tablet    Refill:  0    Order Specific Question:   Supervising Provider    Answer:   Penni Homans A [[id number]]   albuterol (VENTOLIN HFA) 108 (90 Base) MCG/ACT inhaler    Sig: Inhale 2 puffs into the lungs every 6 (six) hours as needed for wheezing or shortness of breath.    Dispense:  8 g    Refill:  3    Order Specific Question:   Supervising Provider    Answer:  [place], [place] [[id number]]

## 2022-04-13 NOTE — Assessment & Plan Note (Addendum)
Will continue prednisone for 3 more days and obtain CXR to rule out PNA. Continue albuterol every 6 hours as needed. Refill has been sent.

## 2022-04-14 MED ORDER — AZITHROMYCIN 250 MG PO TABS: ORAL_TABLET | ORAL | 0 refills | Status: AC

## 2022-04-17 ENCOUNTER — Encounter: Payer: Self-pay | Admitting: Family

## 2022-04-17 NOTE — Telephone Encounter (Signed)
Spoke with pt. Advised her to go to the ER since she is feeling so weak.  Pt is agreeable and will ask her daughter to drive her.

## 2022-04-19 ENCOUNTER — Ambulatory Visit: Payer: Medicaid Other | Admitting: Orthopedic Surgery

## 2022-04-23 ENCOUNTER — Ambulatory Visit: Payer: Medicaid Other | Admitting: Family

## 2022-05-02 ENCOUNTER — Ambulatory Visit: Payer: Medicaid Other | Admitting: Orthopedic Surgery

## 2022-05-02 ENCOUNTER — Encounter (INDEPENDENT_AMBULATORY_CARE_PROVIDER_SITE_OTHER): Payer: Medicaid Other | Admitting: Family

## 2022-05-02 DIAGNOSIS — J111 Influenza due to unidentified influenza virus with other respiratory manifestations: Secondary | ICD-10-CM

## 2022-05-03 DIAGNOSIS — J111 Influenza due to unidentified influenza virus with other respiratory manifestations: Secondary | ICD-10-CM | POA: Diagnosis not present

## 2022-05-03 MED ORDER — OSELTAMIVIR PHOSPHATE 75 MG PO CAPS: 75.0000 mg | ORAL_CAPSULE | Freq: Two times a day (BID) | ORAL | 0 refills | Status: AC

## 2022-05-03 NOTE — Telephone Encounter (Signed)
Please see the MyChart message reply(ies) for my assessment and plan.  The patient gave consent for this Medical Advice Message and is aware that it may result in a bill to their insurance company as well as the possibility that this may result in a co-payment or deductible. They are an established patient, but are not seeking medical advice exclusively about a problem treated during an in person or video visit in the last 7 days. I did not recommend an in person or video visit within 7 days of my reply.  I spent a total of 5 minutes cumulative provider time within 7 days through MyChart messaging.  Lindsay Soulliere S O'Sullivan, NP  

## 2022-05-11 ENCOUNTER — Ambulatory Visit (INDEPENDENT_AMBULATORY_CARE_PROVIDER_SITE_OTHER): Payer: Medicaid Other | Admitting: Surgical

## 2022-05-11 ENCOUNTER — Ambulatory Visit: Payer: Self-pay

## 2022-05-11 DIAGNOSIS — M75112 Incomplete rotator cuff tear or rupture of left shoulder, not specified as traumatic: Secondary | ICD-10-CM

## 2022-05-11 DIAGNOSIS — M7522 Bicipital tendinitis, left shoulder: Secondary | ICD-10-CM | POA: Diagnosis not present

## 2022-05-13 ENCOUNTER — Encounter: Payer: Self-pay | Admitting: Surgical

## 2022-05-13 MED ORDER — BUPIVACAINE HCL 0.5 % IJ SOLN
9.0000 mL | INTRAMUSCULAR | Status: AC | PRN
  Administered 2022-05-11: 9 mL via INTRA_ARTICULAR

## 2022-05-13 MED ORDER — METHYLPREDNISOLONE ACETATE 40 MG/ML IJ SUSP
40.0000 mg | INTRAMUSCULAR | Status: AC | PRN
  Administered 2022-05-11: 40 mg via INTRA_ARTICULAR

## 2022-05-13 MED ORDER — LIDOCAINE HCL 1 % IJ SOLN
5.0000 mL | INTRAMUSCULAR | Status: AC | PRN
  Administered 2022-05-11: 5 mL

## 2022-05-13 NOTE — Progress Notes (Signed)
Follow-up Office Visit Note   Patient: Dawn Lowery           Date of Birth: 08-Dec-1950           MRN: [id number] Visit Date: 05/11/2022 Requested by: [name], NP [address] [place],  [place] [address] PCP: [name], NP  Subjective: Chief Complaint  Patient presents with   Left Shoulder - Pain    HPI: Dawn Lowery is a 71 y.o. female who returns to the office for follow-up visit.    Plan at last visit was: Patient is a 71 year old female who presents complaining of left shoulder pain. She has previous MRI demonstrating partial-thickness rotator cuff tear and mild glenohumeral arthritis. She had great relief with subacromial injection in mid March and would like to repeat this today. She also had recent cervical spine ESI that gave her good relief of her neck pain and to some extent her shoulder pain but she feels like she had better relief from subacromial injection. Injection repeated today. Excellent rotator cuff strength on exam today. Follow-up with the office as needed.   Since then, patient notes last injection did not really give her any significant relief like it did before.  It helped to a small degree but she feels she has continued shoulder pain that never fully went away.  Mostly localizes the majority of her pain to the lateral aspect of the left shoulder with radiation down to the elbow.  No radicular pain or numbness/tingling.  She does have catching sensation.  No new injury.  She has been taking a lot of ibuprofen to help with the pain with some relief.  Also takes tramadol which has been most helpful thing.              ROS: All systems reviewed are negative as they relate to the chief complaint within the history of present illness.  Patient denies fevers or chills.  Assessment & Plan: Visit Diagnoses:  1. Incomplete rotator cuff tear or rupture of left shoulder, not specified as traumatic     Plan: Dawn Lowery is a 71 y.o.  female who returns to the office for follow-up visit for left shoulder pain..  Plan from last visit was noted above in HPI.  They now return with not as much relief from left shoulder subacromial injection as she had previously.  She states she continues to have shoulder pain she localizes to the lateral aspect of the shoulder.  Most of her pain was reproduced with O'Brien's sign on exam today and she did not really have much significant discomfort with stressing her rotator cuff and testing her strength today.  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  However she also has tendinosis of the bicep tendon and partial-thickness cartilage loss of the glenohumeral joint.  With lack of relief from subacromial injection, plan to try glenohumeral injection today under ultrasound guidance.  She tolerated procedure well.  Will see how this does for her and discuss next steps if she has fleeting relief.  Recommended she send [name] a message detailing how much relief she got and then we can decide where to go from there.  Another thought would be she may have referred pain from the cervical spine if she has had previous C-spine ESI that gave her good relief of neck and trapezial pain with some relief of her left shoulder  pain.  She has moderate to severe bilateral C6 and C7 foraminal narrowing which may be playing a factor.  Follow-Up Instructions: No follow-ups on file.   Orders:  Orders Placed This Encounter  Procedures   US Guided Needle Placement - No Linked Charges   No orders of the defined types were placed in this encounter.     Procedures: Large Joint Inj: L glenohumeral on 05/11/2022 11:48 AM Indications: diagnostic evaluation and pain Details: 22 G 1.5 in and 3.5 in needle, ultrasound-guided posterior approach  Arthrogram: No  Medications: 9 mL bupivacaine 0.5 %; 40 mg methylPREDNISolone acetate 40 MG/ML;  5 mL lidocaine 1 % Outcome: tolerated well, no immediate complications Procedure, treatment alternatives, risks and benefits explained, specific risks discussed. Consent was given by the patient. Immediately prior to procedure a time out was called to verify the correct patient, procedure, equipment, support staff and site/side marked as required. Patient was prepped and draped in the usual sterile fashion.       Clinical Data: No additional findings.  Objective: Vital Signs: There were no vitals taken for this visit.  Physical Exam:  Constitutional: Patient appears well-developed HEENT:  Head: Normocephalic Eyes:EOM are normal Neck: Normal range of motion Cardiovascular: Normal rate Pulmonary/chest: Effort normal Neurologic: Patient is alert Skin: Skin is warm Psychiatric: Patient has normal mood and affect  Ortho Exam: Ortho exam demonstrates left shoulder with 40 degrees X rotation, 90 degrees abduction, 170 degrees forward flexion.  She has excellent rotator cuff strength of supra, infra, subscap rated 5/5.  Tenderness over the bicipital groove.  Positive O'Brien sign.  Full active range of motion equivalent to passive range of motion.  Intact EPL, FPL, finger abduction, grip strength testing, pronation/supination, bicep, tricep, deltoid.  Axillary nerve is intact with deltoid firing.  Specialty Comments:  MRI CERVICAL SPINE WITHOUT CONTRAST   TECHNIQUE: Multiplanar, multisequence MR imaging of the cervical spine was performed. No intravenous contrast was administered.   COMPARISON:  Prior radiograph from 06/07/2021.   FINDINGS: Alignment: Mild straightening of the normal cervical lordosis. No listhesis.   Vertebrae: Vertebral body height maintained without acute or chronic fracture. Bone marrow signal intensity mildly heterogeneous but overall within normal limits. No discrete or worrisome osseous lesions. Mild discogenic reactive endplate change present about  the C5-6 and C6-7 interspaces. No other abnormal marrow edema.   Cord: Normal signal and morphology.   Posterior Fossa, vertebral arteries, paraspinal tissues: Hazy signal abnormality within the visualized pons most consistent with chronic microvascular ischemic disease. Few superimposed small remote lacunar infarcts noted. Few small remote cerebellar infarcts partially visualized as well. Craniocervical junction normal. Paraspinous soft tissues normal. Normal flow voids seen within the vertebral arteries bilaterally.   Disc levels:   C2-C3: Normal interspace. Left-sided facet hypertrophy with associated ankylosis. No canal or foraminal stenosis.   C3-C4: Disc desiccation with small central disc protrusion (series 5, image 18). Mild left greater than right facet hypertrophy. No spinal stenosis. Mild left C4 foraminal narrowing. Right neural foramen remains patent.   C4-C5: Disc desiccation with mild annular bulge. Mild facet hypertrophy. No spinal stenosis. Foramina remain patent.   C5-C6: Moderate intervertebral disc space narrowing with diffuse disc osteophyte complex. Flattening and partial effacement of the ventral thecal sac with resultant mild spinal stenosis. No frank cord impingement. Severe left with moderate right C6 foraminal narrowing.   C6-C7: Moderate intervertebral disc space narrowing with diffuse disc osteophyte complex. Broad posterior component flattens and partially faces the ventral thecal sac with no  more than mild spinal stenosis. No frank cord impingement. Moderate bilateral C7 foraminal stenosis.   C7-T1: Negative interspace. Mild facet hypertrophy. No canal or foraminal stenosis.   Visualized upper thoracic spine demonstrates no significant finding.   IMPRESSION: 1. Degenerative spondylosis at C5-6 and C6-7 with resultant mild spinal stenosis, with moderate to severe bilateral C6 and C7 foraminal narrowing as above. 2. Mild left C4 foraminal  stenosis related to uncovertebral and facet hypertrophy. 3. Chronic microvascular ischemic disease within the visualized pons, with multiple remote lacunar infarcts about the visualized brainstem and cerebellum.     Electronically Signed   By: Jeannine Boga M.D.   On: 07/22/2021 00:44  Imaging: No results found.   PMFS History: Patient Active Problem List   Diagnosis Date Noted   COPD exacerbation ([place]) 02/06/2022   Ventral hernia without obstruction or gangrene 01/12/2022   Gastroenteritis 01/12/2022   Hematochezia 01/12/2022   Right inguinal hernia 01/08/2022   Right lower quadrant abdominal mass 01/03/2022   Recurrent UTI 09/08/2021   Neural foraminal stenosis of cervical spine 09/08/2021   COPD (chronic obstructive pulmonary disease) ([place]) 06/21/2021   Skin cancer 10/12/2020   [place] 09/30/2020   GERD (gastroesophageal reflux disease) 09/30/2020   Cerebrovascular accident (CVA) due to embolism of left middle cerebral artery ([place]) 06/14/2020   B12 deficiency 06/14/2020   Tobacco abuse 06/14/2020   Hyperlipidemia 06/14/2020   Essential hypertension 06/14/2020   Past Medical History:  Diagnosis Date   Colitis    CVA (cerebral vascular accident) ([place]) 05/2020   Hypertension     Family History  Problem Relation Age of Onset   Lung cancer Mother    Lung cancer Father    Obesity Sister    Hypertension Sister        [name]   Lung cancer Paternal Grandfather     Past Surgical History:  Procedure Laterality Date   ABDOMINAL HYSTERECTOMY     ELBOW SURGERY Right 2011   tennis elbow   FEMORAL HERNIA REPAIR  02/2022   FOOT SURGERY Right    big toe- pinning due to fracture   HAND SURGERY Left    thumb surgery   UMBILICAL HERNIA REPAIR  02/2022   Social History   Occupational History   Not on file  Tobacco Use   Smoking status: Every Day    Packs/day: 0.50    Years: 20.00    Total pack years: 10.00    Types: Cigarettes   Smokeless tobacco:  Never  Substance and Sexual Activity   Alcohol use: Not Currently   Drug use: Never   Sexual activity: Not Currently

## 2022-05-23 ENCOUNTER — Encounter: Payer: Self-pay | Admitting: Family

## 2022-05-23 ENCOUNTER — Ambulatory Visit (INDEPENDENT_AMBULATORY_CARE_PROVIDER_SITE_OTHER): Payer: Medicaid Other | Admitting: Family

## 2022-05-23 VITALS — BP 159/80 | HR 62 | Temp 97.9°F | Resp 16 | Wt 146.0 lb

## 2022-05-23 DIAGNOSIS — I10 Essential (primary) hypertension: Secondary | ICD-10-CM

## 2022-05-23 DIAGNOSIS — I639 Cerebral infarction, unspecified: Secondary | ICD-10-CM | POA: Diagnosis not present

## 2022-05-23 DIAGNOSIS — Z87891 Personal history of nicotine dependence: Secondary | ICD-10-CM

## 2022-05-23 DIAGNOSIS — E876 Hypokalemia: Secondary | ICD-10-CM

## 2022-05-23 LAB — BASIC METABOLIC PANEL WITH GFR
BUN: 9 mg/dL (ref 6–23)
CO2: 25 meq/L (ref 19–32)
Calcium: 9.4 mg/dL (ref 8.4–10.5)
Chloride: 108 meq/L (ref 96–112)
Creatinine, Ser: 0.78 mg/dL (ref 0.40–1.20)
GFR: 76.39 mL/min
Glucose, Bld: 83 mg/dL (ref 70–99)
Potassium: 4.8 meq/L (ref 3.5–5.1)
Sodium: 142 meq/L (ref 135–145)

## 2022-05-23 NOTE — Assessment & Plan Note (Signed)
Her hctz and Kdur were stopped during her hospitalization. In the setting of acute stroke will hold for 1 more week. I then recommended that patient restart both medications in 1 week and see me in 1 month.

## 2022-05-23 NOTE — Patient Instructions (Signed)
Restart HCTZ and potassium in 1 week.

## 2022-05-23 NOTE — Assessment & Plan Note (Signed)
Quit 05/18/21.

## 2022-05-23 NOTE — Assessment & Plan Note (Signed)
MRI noted the following:   IMPRESSION:  1. Acute lacunar infarct in the right frontal white matter.  2. Advanced chronic small vessel disease.   Pt was continued on statin, aspirin was increased from '81mg'$  to '325mg'$ .  She  has also quit smoking which I commended her on.

## 2022-05-23 NOTE — Progress Notes (Signed)
Subjective:   By signing my name below, I, Dawn Lowery, attest that this documentation has been prepared under the direction and in the presence of Dawn Alar, NP. 05/23/2022   Patient ID: Dawn Lowery, female    DOB: 10-16-1950, 72 y.o.   MRN: [id number]  Chief Complaint  Patient presents with   [facility] follow up   Cerebrovascular Accident    Follow up after having a stroke 05/17/2022    HPI Patient is in today for a hospital follow up visit.   Hospital visit: She was admitted to the hospital on 05/17/2022 for a cerebrovascular accident.  She reports eating with her granddaughter and driving home but could not remember where she was. After that her daughter picked her up and taken her to the hospital. She did not need physical therapy or vocational therapy following her discharge. Her aspirin dosage was in increased. She was taken off hydrochlorothiazide and potassium supplements.  Blood pressure: She reports her blood pressure was high while in the hospital.  BP Readings from Last 3 Encounters:  05/23/22 (!) 159/80  04/13/22 (!) 148/84  04/10/22 (!) 158/78   Pulse Readings from Last 3 Encounters:  05/23/22 62  04/13/22 85  04/10/22 84   Headache: She complains of a mild headache. She os taking ibuprofen to manage it.   Fatigue: She complains of fatigue since she stopped taking potassium supplements.   Sleep: She complains of being able to sleep only 5-6 hours a night.  Smoking: She stopped smoking since her stroke.    Past Medical History:  Diagnosis Date   Colitis    CVA (cerebral vascular accident) ([place]) 05/2020   Hypertension     Past Surgical History:  Procedure Laterality Date   ABDOMINAL HYSTERECTOMY     ELBOW SURGERY Right 2011   tennis elbow   FEMORAL HERNIA REPAIR  02/2022   FOOT SURGERY Right    big toe- pinning due to fracture   HAND SURGERY Left    thumb surgery   UMBILICAL HERNIA REPAIR  02/2022    Family History   Problem Relation Age of Onset   Lung cancer Mother    Lung cancer Father    Obesity Sister    Hypertension Sister        Dawn Lowery   Lung cancer Paternal Grandfather     Social History   Socioeconomic History   Marital status: Divorced    Spouse name: Not on file   Number of children: Not on file   Years of education: Not on file   Highest education level: Not on file  Occupational History   Not on file  Tobacco Use   Smoking status: Former    Packs/day: 0.50    Years: 20.00    Total pack years: 10.00    Types: Cigarettes    Quit date: 05/18/2021    Years since quitting: 1.0   Smokeless tobacco: Never  Substance and Sexual Activity   Alcohol use: Not Currently   Drug use: Never   Sexual activity: Not Currently  Other Topics Concern   Not on file  Social History Narrative   Retired Engineer, structural   Son- lives in [place]   Daughter lives locally   5 grandchildren and 1 great grandson   Enjoys spending time with her daughter and relaxing.    2 dogs   Single   Completed HS   Social Determinants of Health   Financial Resource Strain:  Low Risk  (03/09/2021)   Overall Financial Resource Strain (CARDIA)    Difficulty of Paying Living Expenses: Not hard at all  Food Insecurity: No Food Insecurity (03/09/2021)   Hunger Vital Sign    Worried About Running Out of Food in the Last Year: Never true    Ran Out of Food in the Last Year: Never true  Transportation Needs: No Transportation Needs (03/09/2021)   PRAPARE - Hydrologist (Medical): No    Lack of Transportation (Non-Medical): No  Physical Activity: Inactive (03/09/2021)   Exercise Vital Sign    Days of Exercise per Week: 0 days    Minutes of Exercise per Session: 0 min  Stress: No Stress Concern Present (03/09/2021)   [place]    Feeling of Stress : Not at all  Social Connections: Socially Isolated  (03/09/2021)   Social Connection and Isolation Panel [NHANES]    Frequency of Communication with Friends and Family: More than three times a week    Frequency of Social Gatherings with Friends and Family: More than three times a week    Attends Religious Services: Never    Marine scientist or Organizations: No    Attends Archivist Meetings: Never    Marital Status: Divorced  Human resources officer Violence: Not At Risk (03/09/2021)   Humiliation, Afraid, Rape, and Kick questionnaire    Fear of Current or Ex-Partner: No    Emotionally Abused: No    Physically Abused: No    Sexually Abused: No    Outpatient Medications Prior to Visit  Medication Sig Dispense Refill   albuterol (VENTOLIN HFA) 108 (90 Base) MCG/ACT inhaler Inhale 2 puffs into the lungs every 6 (six) hours as needed for wheezing or shortness of breath. 8 g 3   aspirin EC 325 MG tablet Take 325 mg by mouth daily.     atorvastatin (LIPITOR) 80 MG tablet Take 1 tablet (80 mg total) by mouth daily. 90 tablet 0   estradiol (ESTRACE VAGINAL) 0.1 MG/GM vaginal cream Insert 2 g daily intravaginally for 2 weeks, followed by a maintenance dose of 1 g two 42.5 g 12   lisinopril (ZESTRIL) 20 MG tablet Take 1 tablet (20 mg total) by mouth daily. 90 tablet 0   vitamin B-12 (CYANOCOBALAMIN) 1000 MCG tablet Take 1 tablet (1,000 mcg total) by mouth daily.     hydrochlorothiazide (HYDRODIURIL) 25 MG tablet Take 1 tablet by mouth once daily (Patient not taking: Reported on 05/23/2022) 90 tablet 0   potassium chloride SA (KLOR-CON M) 20 MEQ tablet Take 1 tablet by mouth once daily (Patient not taking: Reported on 05/23/2022) 90 tablet 0   aspirin EC 81 MG tablet Take 81 mg by mouth daily. Swallow whole.     predniSONE (DELTASONE) 10 MG tablet Take 2 tabs by mouth once daily for 3 more days. 7 tablet 0   No facility-administered medications prior to visit.    Allergies  Allergen Reactions   Augmentin [Amoxicillin-Pot Clavulanate]  Hives and Itching   Ciprofloxacin     ? Chest tightness   Keflex [Cephalexin]     itching   Macrobid [Nitrofurantoin]     ? Chest tightness   Morphine And Related Nausea And Vomiting   Sulfa Antibiotics Hives    Review of Systems  Constitutional:  Positive for malaise/fatigue.  Neurological:  Positive for headaches (mild).  Psychiatric/Behavioral:  The patient has insomnia.  Objective:    Physical Exam Constitutional:      General: She is not in acute distress.    Appearance: Normal appearance. She is not ill-appearing.  HENT:     Head: Normocephalic and atraumatic.     Right Ear: External ear normal.     Left Ear: External ear normal.  Eyes:     Extraocular Movements: Extraocular movements intact.     Pupils: Pupils are equal, round, and reactive to light.  Cardiovascular:     Rate and Rhythm: Normal rate and regular rhythm.     Heart sounds: Normal heart sounds. No murmur heard.    No gallop.  Pulmonary:     Effort: Pulmonary effort is normal. No respiratory distress.     Breath sounds: Normal breath sounds. No wheezing or rales.  Musculoskeletal:     Comments: 5/5 strength in both upper and lower extremities  Skin:    General: Skin is warm and dry.  Neurological:     Mental Status: She is alert and oriented to person, place, and time.     Comments: Cranial nerves intact   Psychiatric:        Judgment: Judgment normal.     BP (!) 159/80 (BP Location: Right Arm, Patient Position: Sitting, Cuff Size: Small)   Pulse 62   Temp 97.9 F (36.6 C) (Oral)   Resp 16   Wt 146 lb (66.2 kg)   SpO2 100%   BMI 24.30 kg/m  Wt Readings from Last 3 Encounters:  05/23/22 146 lb (66.2 kg)  04/13/22 148 lb (67.1 kg)  04/10/22 148 lb (67.1 kg)       Assessment & Plan:  Hypokalemia -     Basic metabolic panel  Cerebrovascular accident (CVA), unspecified mechanism ([place]) Assessment & Plan: MRI noted the following:   IMPRESSION:  1. Acute lacunar infarct in the  right frontal white matter.  2. Advanced chronic small vessel disease.   Pt was continued on statin, aspirin was increased from '81mg'$  to '325mg'$ .  She  has also quit smoking which I commended her on.    History of tobacco abuse Assessment & Plan: Quit 05/18/21.    Essential hypertension Assessment & Plan: Her hctz and Kdur were stopped during her hospitalization. In the setting of acute stroke will hold for 1 more week. I then recommended that patient restart both medications in 1 week and see me in 1 month.      I, Nance Pear, NP, personally preformed the services described in this documentation.  All medical record entries made by the scribe were at my direction and in my presence.  I have reviewed the chart and discharge instructions (if applicable) and agree that the record reflects my personal performance and is accurate and complete. 05/23/2022   I,Dawn Lowery,acting as a Education administrator for Nance Pear, NP.,have documented all relevant documentation on the behalf of Nance Pear, NP,as directed by  Nance Pear, NP while in the presence of Nance Pear, NP.   Nance Pear, NP

## 2022-05-25 ENCOUNTER — Telehealth: Payer: Self-pay | Admitting: Surgical

## 2022-05-25 NOTE — Telephone Encounter (Signed)
FYI... Patient called in to let Lurena Joiner know the injection worked great and still feels great and will let us know when the pain starts back

## 2022-05-26 ENCOUNTER — Other Ambulatory Visit: Payer: Self-pay | Admitting: Family

## 2022-06-01 ENCOUNTER — Encounter: Payer: Medicaid Other | Admitting: Gastroenterology

## 2022-06-25 ENCOUNTER — Ambulatory Visit (INDEPENDENT_AMBULATORY_CARE_PROVIDER_SITE_OTHER): Payer: Medicaid Other | Admitting: Family

## 2022-06-25 VITALS — BP 136/70 | HR 76 | Temp 98.3°F | Resp 16 | Wt 151.0 lb

## 2022-06-25 DIAGNOSIS — I10 Essential (primary) hypertension: Secondary | ICD-10-CM | POA: Diagnosis not present

## 2022-06-25 DIAGNOSIS — Z7185 Encounter for immunization safety counseling: Secondary | ICD-10-CM | POA: Diagnosis not present

## 2022-06-25 LAB — BASIC METABOLIC PANEL
BUN: 6 mg/dL (ref 6–23)
CO2: 31 mEq/L (ref 19–32)
Calcium: 10 mg/dL (ref 8.4–10.5)
Chloride: 103 mEq/L (ref 96–112)
Creatinine, Ser: 0.66 mg/dL (ref 0.40–1.20)
GFR: 88.16 mL/min (ref >60.00)
Glucose, Bld: 65 mg/dL — ABNORMAL LOW (ref 70–99)
Potassium: 5.1 mEq/L (ref 3.5–5.1)
Sodium: 141 mEq/L (ref 135–145)

## 2022-06-25 MED ORDER — LISINOPRIL 20 MG PO TABS: 20.0000 mg | ORAL_TABLET | Freq: Every day | ORAL | 0 refills | Status: DC

## 2022-06-25 MED ORDER — ATORVASTATIN CALCIUM 80 MG PO TABS: 80.0000 mg | ORAL_TABLET | Freq: Every day | ORAL | 1 refills | Status: DC

## 2022-06-25 MED ORDER — BREO ELLIPTA 100-25 MCG/ACT IN AEPB: 1.0000 | INHALATION_SPRAY | Freq: Every day | RESPIRATORY_TRACT | 5 refills | Status: DC

## 2022-06-25 MED ORDER — HYDROCHLOROTHIAZIDE 25 MG PO TABS: 25.0000 mg | ORAL_TABLET | Freq: Every day | ORAL | 1 refills | Status: DC

## 2022-06-25 MED ORDER — POTASSIUM CHLORIDE CRYS ER 20 MEQ PO TBCR: 20.0000 meq | EXTENDED_RELEASE_TABLET | Freq: Every day | ORAL | 1 refills | Status: DC

## 2022-06-25 NOTE — Progress Notes (Signed)
Subjective:     Patient ID: Dawn Lowery, female    DOB: Apr 23, 1951, 72 y.o.   MRN: [id number]  Chief Complaint  Patient presents with   Hypertension    Here for follow up    Hypertension   Patient is in today for follow up of her HTN.  Prior to last visit her hctz and Kdur were held in the setting of acute CVA.  She was advised to wait an additional week and then restart both medications.   BP Readings from Last 3 Encounters:  06/25/22 136/70  05/23/22 (!) 159/80  04/13/22 (!) 148/84   Reports that she could not afford colonoscopy.  States that medicare was not going to pay for it and she could not afford out of pocket.   Health Maintenance Due  Topic Date Due   COLONOSCOPY (Pts 45-64yr Insurance coverage will need to be confirmed)  Never done   DEXA SCAN  Never done   COVID-19 Vaccine (3 - Moderna risk series) 03/20/2021    Past Medical History:  Diagnosis Date   Colitis    [place]   [place]     Past Surgical History:  Procedure Laterality Date   ABDOMINAL HYSTERECTOMY     ELBOW SURGERY Right 2011   tennis elbow   FEMORAL HERNIA REPAIR  02/2022   FOOT SURGERY Right    big toe- pinning due to fracture   HAND SURGERY Left    thumb surgery   UMBILICAL HERNIA REPAIR  02/2022    Family History  Problem Relation Age of Onset   Lung cancer Mother    Lung cancer Father    Obesity Sister    Hypertension Sister        GJossie Ng  Lung cancer Paternal Grandfather     Social History   Socioeconomic History   Marital status: Divorced    Spouse name: Not on file   Number of children: Not on file   Years of education: Not on file   Highest education level: Not on file  Occupational History   Not on file  Tobacco Use   Smoking status: Former    Packs/day: 0.50    Years: 20.00    Total pack years: 10.00    Types: Cigarettes    Quit date: 05/18/2021    Years since quitting: 1.1   Smokeless tobacco: Never   Substance and Sexual Activity   Alcohol use: Not Currently   Drug use: Never   Sexual activity: Not Currently  Other Topics Concern   Not on file  Social History Narrative   Retired cEngineer, 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    2 dogs   Single   Completed HS   Social Determinants of Health   Financial Resource Strain: Low Risk  (03/09/2021)   Overall Financial Resource Strain (CARDIA)    Difficulty of Paying Living Expenses: Not hard at all  Food Insecurity: No Food Insecurity (03/09/2021)   Hunger Vital Sign    Worried About Running Out of Food in the Last Year: Never true    Ran Out of Food in the Last Year: Never true  Transportation Needs: No Transportation Needs (03/09/2021)   PRAPARE - THydrologist(Medical): No    Lack of Transportation (Non-Medical): No  Physical  Activity: Inactive (03/09/2021)   Exercise Vital Sign    Days of Exercise per Week: 0 days    Minutes of Exercise per Session: 0 min  Stress: No Stress Concern Present (03/09/2021)   [place]    Feeling of Stress : Not at all  Social Connections: Socially Isolated (03/09/2021)   Social Connection and Isolation Panel [NHANES]    Frequency of Communication with Friends and Family: More than three times a week    Frequency of Social Gatherings with Friends and Family: More than three times a week    Attends Religious Services: Never    Marine scientist or Organizations: No    Attends Archivist Meetings: Never    Marital Status: Divorced  Human resources officer Violence: Not At Risk (03/09/2021)   Humiliation, Afraid, Rape, and Kick questionnaire    Fear of Current or Ex-Partner: No    Emotionally Abused: No    Physically Abused: No    Sexually Abused: No    Outpatient Medications  Prior to Visit  Medication Sig Dispense Refill   albuterol (VENTOLIN HFA) 108 (90 Base) MCG/ACT inhaler Inhale 2 puffs into the lungs every 6 (six) hours as needed for wheezing or shortness of breath. 8 g 3   aspirin EC 325 MG tablet Take 325 mg by mouth daily.     estradiol (ESTRACE VAGINAL) 0.1 MG/GM vaginal cream Insert 2 g daily intravaginally for 2 weeks, followed by a maintenance dose of 1 g two 42.5 g 12   vitamin B-12 (CYANOCOBALAMIN) 1000 MCG tablet Take 1 tablet (1,000 mcg total) by mouth daily.     atorvastatin (LIPITOR) 80 MG tablet Take 1 tablet by mouth once daily 90 tablet 0   BREO ELLIPTA 100-25 MCG/ACT AEPB 1 puff daily.     hydrochlorothiazide (HYDRODIURIL) 25 MG tablet Take 1 tablet by mouth once daily 90 tablet 0   lisinopril (ZESTRIL) 20 MG tablet Take 1 tablet (20 mg total) by mouth daily. 90 tablet 0   potassium chloride SA (KLOR-CON M) 20 MEQ tablet Take 1 tablet by mouth once daily 90 tablet 0   No facility-administered medications prior to visit.    Allergies  Allergen Reactions   Augmentin [Amoxicillin-Pot Clavulanate] Hives and Itching   Ciprofloxacin     ? Chest tightness   Keflex [Cephalexin]     itching   Macrobid [Nitrofurantoin]     ? Chest tightness   Morphine And Related Nausea And Vomiting   Sulfa Antibiotics Hives    ROS See HPI    Objective:    Physical Exam Constitutional:      General: She is not in acute distress.    Appearance: Normal appearance. She is well-developed.  HENT:     Head: Normocephalic and atraumatic.     Right Ear: External ear normal.     Left Ear: External ear normal.  Eyes:     General: No scleral icterus. Neck:     Thyroid: No thyromegaly.  Cardiovascular:     Rate and Rhythm: Normal rate and regular rhythm.     Heart sounds: Normal heart sounds. No murmur heard. Pulmonary:     Effort: Pulmonary effort is normal. No respiratory distress.     Breath sounds: Normal breath sounds. No wheezing.   Musculoskeletal:     Cervical back: Neck supple.  Skin:    General: Skin is warm and dry.  Neurological:     Mental Status:  She is alert and oriented to person, place, and time.  Psychiatric:        Mood and Affect: Mood normal.        Behavior: Behavior normal.        Thought Content: Thought content normal.        Judgment: Judgment normal.     BP 136/70 (BP Location: Right Arm, Patient Position: Sitting, Cuff Size: Small)   Pulse 76   Temp 98.3 F (36.8 C) (Oral)   Resp 16   Wt 151 lb (68.5 kg)   SpO2 99%   BMI 25.13 kg/m  Wt Readings from Last 3 Encounters:  06/25/22 151 lb (68.5 kg)  05/23/22 146 lb (66.2 kg)  04/13/22 148 lb (67.1 kg)       Assessment & Plan:   Problem List Items Addressed This Visit       Unprioritized   Immunization counseling    States that covid booster was not covered by her insurance and she could not afford it. I encouraged her to reach out to the Health Department to see if they could offer her a reduced cost vaccine.       Essential hypertension - Primary    BP is stable on hctz. Continue same. Obtain follow up bmet today.       Relevant Medications   lisinopril (ZESTRIL) 20 MG tablet   hydrochlorothiazide (HYDRODIURIL) 25 MG tablet   atorvastatin (LIPITOR) 80 MG tablet   Other Relevant Orders   Basic Metabolic Panel (BMET)    I have changed Nobie Putnam. Bouse "[name]"'s Breo Ellipta, potassium chloride SA, hydrochlorothiazide, and atorvastatin. I am also having her maintain her cyanocobalamin, estradiol, albuterol, aspirin EC, and lisinopril.  Meds ordered this encounter  Medications   BREO ELLIPTA 100-25 MCG/ACT AEPB    Sig: Inhale 1 puff into the lungs daily.    Dispense:  28 each    Refill:  5    Order Specific Question:   Supervising Provider    Answer:   Penni Homans A [[id number]]   potassium chloride SA (KLOR-CON M) 20 MEQ tablet    Sig: Take 1 tablet (20 mEq total) by mouth daily.    Dispense:  90 tablet    Refill:  1     Order Specific Question:   Supervising Provider    Answer:   Penni Homans A [[id number]]   lisinopril (ZESTRIL) 20 MG tablet    Sig: Take 1 tablet (20 mg total) by mouth daily.    Dispense:  90 tablet    Refill:  0    Order Specific Question:   Supervising Provider    Answer:   Penni Homans A [[id number]]   hydrochlorothiazide (HYDRODIURIL) 25 MG tablet    Sig: Take 1 tablet (25 mg total) by mouth daily.    Dispense:  90 tablet    Refill:  1    Order Specific Question:   Supervising Provider    Answer:   Penni Homans A [[id number]]   atorvastatin (LIPITOR) 80 MG tablet    Sig: Take 1 tablet (80 mg total) by mouth daily.    Dispense:  90 tablet    Refill:  1    Order Specific Question:   Supervising Provider    Answer:   Penni Homans A [[id number]]

## 2022-06-25 NOTE — Assessment & Plan Note (Signed)
States that covid booster was not covered by her insurance and she could not afford it. I encouraged her to reach out to the Health Department to see if they could offer her a reduced cost vaccine.

## 2022-06-25 NOTE — Assessment & Plan Note (Signed)
BP is stable on hctz. Continue same. Obtain follow up bmet today.

## 2022-06-26 ENCOUNTER — Telehealth: Payer: Self-pay | Admitting: Family

## 2022-06-26 NOTE — Telephone Encounter (Signed)
Patient notified of information per provider. She verbalized understanding ad she reports she will reach out to them for appointment.

## 2022-06-26 NOTE — Telephone Encounter (Signed)
Please advise pt that I reached out to the GI team in regards to cost for her colonoscopy. They checked her insurance and said it should be covered at 100%. I would recommend that she call the GI office to reschedule colonoscopy.

## 2022-07-04 ENCOUNTER — Encounter: Payer: Self-pay | Admitting: Family

## 2022-07-04 MED ORDER — PANTOPRAZOLE SODIUM 40 MG PO TBEC: 40.0000 mg | DELAYED_RELEASE_TABLET | Freq: Every day | ORAL | 1 refills | Status: DC

## 2022-07-18 NOTE — Telephone Encounter (Signed)
I called patient.  She is going to call the office tomorrow to schedule appointment to come in and around 2 weeks to evaluate rotator cuff and to inject shoulder again

## 2022-07-19 NOTE — Telephone Encounter (Signed)
FYI

## 2022-08-01 ENCOUNTER — Encounter: Payer: Self-pay | Admitting: Surgical

## 2022-08-01 ENCOUNTER — Other Ambulatory Visit: Payer: Self-pay

## 2022-08-01 ENCOUNTER — Ambulatory Visit (INDEPENDENT_AMBULATORY_CARE_PROVIDER_SITE_OTHER): Payer: Medicaid Other | Admitting: Surgical

## 2022-08-01 DIAGNOSIS — M25512 Pain in left shoulder: Secondary | ICD-10-CM | POA: Diagnosis not present

## 2022-08-01 DIAGNOSIS — M19012 Primary osteoarthritis, left shoulder: Secondary | ICD-10-CM

## 2022-08-01 MED ORDER — BUPIVACAINE HCL 0.5 % IJ SOLN
9.0000 mL | INTRAMUSCULAR | Status: AC | PRN
  Administered 2022-08-01: 9 mL via INTRA_ARTICULAR

## 2022-08-01 MED ORDER — METHYLPREDNISOLONE ACETATE 40 MG/ML IJ SUSP
40.0000 mg | INTRAMUSCULAR | Status: AC | PRN
  Administered 2022-08-01: 40 mg via INTRA_ARTICULAR

## 2022-08-01 MED ORDER — LIDOCAINE HCL 1 % IJ SOLN
5.0000 mL | INTRAMUSCULAR | Status: AC | PRN
  Administered 2022-08-01: 5 mL

## 2022-08-01 NOTE — Progress Notes (Signed)
Follow-up Office Visit Note   Patient: Dawn Lowery           Date of Birth: [birth year]           MRN: [id number] Visit Date: 08/01/2022 Requested by: [name], NP [address] [place],  [place] [address] PCP: [name], NP  Subjective: Chief Complaint  Patient presents with   Left Shoulder - Pain    HPI: Dawn Lowery is a 72 y.o. female who returns to the office for follow-up visit.    Plan at last visit was: Dawn Lowery is a 72 y.o. female who returns to the office for follow-up visit for left shoulder pain..  Plan from last visit was noted above in HPI.  They now return with not as much relief from left shoulder subacromial injection as she had previously.  She states she continues to have shoulder pain she localizes to the lateral aspect of the shoulder.  Most of her pain was reproduced with O'Brien's sign on exam today and she did not really have much significant discomfort with stressing her rotator cuff and testing her strength today.  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  However she also has tendinosis of the bicep tendon and partial-thickness cartilage loss of the glenohumeral joint.  With lack of relief from subacromial injection, plan to try glenohumeral injection today under ultrasound guidance.  She tolerated procedure well.  Will see how this does for her and discuss next steps if she has fleeting relief.  Recommended she send [name] a message detailing how much relief she got and then we can decide where to go from there.  Another thought would be she may have referred pain from the cervical spine if she has had previous C-spine ESI that gave her good relief of neck and trapezial pain with some relief of her left shoulder pain.  She has moderate to severe bilateral C6 and C7 foraminal narrowing which may be playing a factor.   Since then, patient  notes she had near 100% relief from left shoulder pain from glenohumeral injection on 05/11/2022.  This lasted for about 2 and half months before her pain recurred.  She would like to repeat injection today.  She is also concerned about the possible progression of rotator cuff pathology compared with her prior MRI scan from about a year ago.              ROS: All systems reviewed are negative as they relate to the chief complaint within the history of present illness.  Patient denies fevers or chills.  Assessment & Plan: Visit Diagnoses:  1. Left shoulder pain, unspecified chronicity     Plan: Dawn Lowery is a 72 y.o. female who returns to the office for follow-up visit for left shoulder pain.  Plan from last visit was noted above in HPI.  They now return with return of left shoulder pain after excellent relief from glenohumeral injection on 05/11/2022.  She is having increased nighttime pain as the injection has worn off and she is concerned about the status of her rotator cuff.  She had some tendinosis and partial thickness tearing of the supraspinatus noted on MRI from about a year ago.  Ultrasound examination today demonstrates mild to moderate tendinosis of the supraspinatus tendon with no identifiable full-thickness tear.  Infraspinatus and subscapularis tendons appeared intact and unremarkable.  There  was trace glenohumeral joint effusion.  Under ultrasound guidance, glenohumeral injection was administered and patient tolerated procedure well.  She reported significant relief of her symptoms about 5 minutes after the injection.  Plan for her to follow-up with the office as needed with next possible injection about 3 to 4 months from now  Follow-Up Instructions: No follow-ups on file.   Orders:  Orders Placed This Encounter  Procedures   US Guided Needle Placement - No Linked Charges   No orders of the defined types were placed in this encounter.     Procedures: Large Joint Inj: L  glenohumeral on 08/01/2022 11:06 AM Indications: diagnostic evaluation and pain Details: 22 G 1.5 in and 3.5 in needle, ultrasound-guided posterior approach  Arthrogram: No  Medications: 9 mL bupivacaine 0.5 %; 40 mg methylPREDNISolone acetate 40 MG/ML; 5 mL lidocaine 1 % Outcome: tolerated well, no immediate complications Procedure, treatment alternatives, risks and benefits explained, specific risks discussed. Consent was given by the patient. Immediately prior to procedure a time out was called to verify the correct patient, procedure, equipment, support staff and site/side marked as required. Patient was prepped and draped in the usual sterile fashion.       Clinical Data: No additional findings.  Objective: Vital Signs: There were no vitals taken for this visit.  Physical Exam:  Constitutional: Patient appears well-developed HEENT:  Head: Normocephalic Eyes:EOM are normal Neck: Normal range of motion Cardiovascular: Normal rate Pulmonary/chest: Effort normal Neurologic: Patient is alert Skin: Skin is warm Psychiatric: Patient has normal mood and affect  Ortho Exam: Ortho exam demonstrates left shoulder with passive range of motion equivalent to active range of motion.  Patient has about 35 degrees external rotation, 90 degrees abduction, 160 degrees forward elevation.  Excellent rotator cuff strength of supra, infra, subscap rated 5/5.  She does have tenderness over the bicipital groove.  Axillary nerve intact with deltoid firing.  Specialty Comments:  MRI CERVICAL SPINE WITHOUT CONTRAST   TECHNIQUE: Multiplanar, multisequence MR imaging of the cervical spine was performed. No intravenous contrast was administered.   COMPARISON:  Prior radiograph from 06/07/2021.   FINDINGS: Alignment: Mild straightening of the normal cervical lordosis. No listhesis.   Vertebrae: Vertebral body height maintained without acute or chronic fracture. Bone marrow signal intensity mildly  heterogeneous but overall within normal limits. No discrete or worrisome osseous lesions. Mild discogenic reactive endplate change present about the C5-6 and C6-7 interspaces. No other abnormal marrow edema.   Cord: Normal signal and morphology.   Posterior Fossa, vertebral arteries, paraspinal tissues: Hazy signal abnormality within the visualized pons most consistent with chronic microvascular ischemic disease. Few superimposed small remote lacunar infarcts noted. Few small remote cerebellar infarcts partially visualized as well. Craniocervical junction normal. Paraspinous soft tissues normal. Normal flow voids seen within the vertebral arteries bilaterally.   Disc levels:   C2-C3: Normal interspace. Left-sided facet hypertrophy with associated ankylosis. No canal or foraminal stenosis.   C3-C4: Disc desiccation with small central disc protrusion (series 5, image 18). Mild left greater than right facet hypertrophy. No spinal stenosis. Mild left C4 foraminal narrowing. Right neural foramen remains patent.   C4-C5: Disc desiccation with mild annular bulge. Mild facet hypertrophy. No spinal stenosis. Foramina remain patent.   C5-C6: Moderate intervertebral disc space narrowing with diffuse disc osteophyte complex. Flattening and partial effacement of the ventral thecal sac with resultant mild spinal stenosis. No frank cord impingement. Severe left with moderate right C6 foraminal narrowing.   C6-C7: Moderate intervertebral disc  space narrowing with diffuse disc osteophyte complex. Broad posterior component flattens and partially faces the ventral thecal sac with no more than mild spinal stenosis. No frank cord impingement. Moderate bilateral C7 foraminal stenosis.   C7-T1: Negative interspace. Mild facet hypertrophy. No canal or foraminal stenosis.   Visualized upper thoracic spine demonstrates no significant finding.   IMPRESSION: 1. 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 2. Mild left C4 foraminal stenosis related to uncovertebral and facet hypertrophy. 3. Chronic microvascular ischemic disease within the visualized pons, with multiple remote lacunar infarcts about the visualized brainstem and cerebellum.     Electronically Signed   By: Jeannine Boga M.D.   On: 07/22/2021 00:44  Imaging: No results found.   PMFS History: Patient Active Problem List   Diagnosis Date Noted   Immunization counseling 06/25/2022   CVA (cerebral vascular accident) ([place]) 05/23/2022   COPD exacerbation ([place]) 02/06/2022   Ventral hernia without obstruction or gangrene 01/12/2022   Gastroenteritis 01/12/2022   Hematochezia 01/12/2022   Right inguinal hernia 01/08/2022   Right lower quadrant abdominal mass 01/03/2022   Recurrent UTI 09/08/2021   Neural foraminal stenosis of cervical spine 09/08/2021   COPD (chronic obstructive pulmonary disease) ([place]) 06/21/2021   Skin cancer 10/12/2020   [place] 09/30/2020   GERD (gastroesophageal reflux disease) 09/30/2020   Cerebrovascular accident (CVA) due to embolism of left middle cerebral artery ([place]) 06/14/2020   B12 deficiency 06/14/2020   History of tobacco abuse 06/14/2020   Hyperlipidemia 06/14/2020   Essential hypertension 06/14/2020   Past Medical History:  Diagnosis Date   Colitis    CVA (cerebral vascular accident) ([place]) 05/2020   Hypertension     Family History  Problem Relation Age of Onset   Lung cancer Mother    Lung cancer Father    Obesity Sister    Hypertension Sister        [name]   Lung cancer Paternal Grandfather     Past Surgical History:  Procedure Laterality Date   ABDOMINAL HYSTERECTOMY     ELBOW SURGERY Right 2011   tennis elbow   FEMORAL HERNIA REPAIR  02/2022   FOOT SURGERY Right    big toe- pinning due to fracture   HAND SURGERY Left    thumb surgery    UMBILICAL HERNIA REPAIR  02/2022   Social History   Occupational History   Not on file  Tobacco Use   Smoking status: Former    Packs/day: 0.50    Years: 20.00    Additional pack years: 0.00    Total pack years: 10.00    Types: Cigarettes    Quit date: 05/18/2021    Years since quitting: 1.2   Smokeless tobacco: Never  Substance and Sexual Activity   Alcohol use: Not Currently   Drug use: Never   Sexual activity: Not Currently

## 2022-11-02 ENCOUNTER — Other Ambulatory Visit: Payer: Self-pay

## 2022-11-02 ENCOUNTER — Ambulatory Visit (INDEPENDENT_AMBULATORY_CARE_PROVIDER_SITE_OTHER): Payer: Medicaid Other | Admitting: Orthopedic Surgery

## 2022-11-02 ENCOUNTER — Encounter: Payer: Self-pay | Admitting: Orthopedic Surgery

## 2022-11-02 DIAGNOSIS — M19012 Primary osteoarthritis, left shoulder: Secondary | ICD-10-CM | POA: Diagnosis not present

## 2022-11-02 DIAGNOSIS — M25512 Pain in left shoulder: Secondary | ICD-10-CM | POA: Diagnosis not present

## 2022-11-02 NOTE — Progress Notes (Unsigned)
Office Visit Note   Patient: Dawn Lowery           Date of Birth: 1950/08/15           MRN: [id number] Visit Date: 11/02/2022 Requested by: Sandford Craze, NP [id number] [place],  [place] [address] PCP: Sandford Craze, NP  Subjective: Chief Complaint  Patient presents with   Left Shoulder - Pain    HPI: Dawn Lowery is a 72 y.o. female who presents to the office reporting continued left shoulder pain.  Prior glenohumeral joint injection performed 08/01/2022 and 05/11/2022.  Did get good relief from those injections.  The pain is starting to return.  Pain is waking her from sleep at night.  She is right-hand dominant.  Has known history of left shoulder arthritis.  Takes ibuprofen with some relief..                ROS: All systems reviewed are negative as they relate to the chief complaint within the history of present illness.  Patient denies fevers or chills.  Assessment & Plan: Visit Diagnoses:  1. Left shoulder pain, unspecified chronicity     Plan: Impression is left shoulder arthritis with pretty good response to left shoulder intra-articular injections.  Patient wants to delay shoulder replacement.  Repeat glenohumeral joint injection performed today.  Tolerated well.  Follow-up as needed.  Do not really want to do any more injections this year unless absolutely necessary.  Follow-Up Instructions: No follow-ups on file.   Orders:  Orders Placed This Encounter  Procedures   US Guided Needle Placement - No Linked Charges   No orders of the defined types were placed in this encounter.     Procedures: Large Joint Inj: L glenohumeral on 11/02/2022 10:09 PM Indications: diagnostic evaluation and pain Details: 22 G 1.5 in needle, ultrasound-guided posterior approach  Arthrogram: No  Medications: 9 mL bupivacaine 0.5 %; 40 mg methylPREDNISolone acetate 40 MG/ML; 5 mL lidocaine 1 % Outcome: tolerated well, no immediate complications Procedure,  treatment alternatives, risks and benefits explained, specific risks discussed. Consent was given by the patient. Immediately prior to procedure a time out was called to verify the correct patient, procedure, equipment, support staff and site/side marked as required. Patient was prepped and draped in the usual sterile fashion.       Clinical Data: No additional findings.  Objective: Vital Signs: There were no vitals taken for this visit.  Physical Exam:  Constitutional: Patient appears well-developed HEENT:  Head: Normocephalic Eyes:EOM are normal Neck: Normal range of motion Cardiovascular: Normal rate Pulmonary/chest: Effort normal Neurologic: Patient is alert Skin: Skin is warm Psychiatric: Patient has normal mood and affect  Ortho Exam: Ortho exam demonstrates passive range of motion of 70/100/170.  Does have some rotator cuff weakness slightly to external rotation.  Some grinding is present with internal and external rotation of that left shoulder 90 degrees of abduction.  No instability.  No AC joint tenderness present.  No masses lymphadenopathy or skin changes noted in that shoulder girdle region.  Specialty Comments:  MRI CERVICAL SPINE WITHOUT CONTRAST   TECHNIQUE: Multiplanar, multisequence MR imaging of the cervical spine was performed. No intravenous contrast was administered.   COMPARISON:  Prior radiograph from 06/07/2021.   FINDINGS: Alignment: Mild straightening of the normal cervical lordosis. No listhesis.   Vertebrae: Vertebral body height maintained without acute or chronic fracture. Bone marrow signal intensity mildly heterogeneous but overall within normal limits. No discrete  or worrisome osseous lesions. Mild discogenic reactive endplate change present about the C5-6 and C6-7 interspaces. No other abnormal marrow edema.   Cord: Normal signal and morphology.   Posterior Fossa, vertebral arteries, paraspinal tissues: Hazy signal abnormality within  the visualized pons most consistent with chronic microvascular ischemic disease. Few superimposed small remote lacunar infarcts noted. Few small remote cerebellar infarcts partially visualized as well. Craniocervical junction normal. Paraspinous soft tissues normal. Normal flow voids seen within the vertebral arteries bilaterally.   Disc levels:   C2-C3: Normal interspace. Left-sided facet hypertrophy with associated ankylosis. No canal or foraminal stenosis.   C3-C4: Disc desiccation with small central disc protrusion (series 5, image 18). Mild left greater than right facet hypertrophy. No spinal stenosis. Mild left C4 foraminal narrowing. Right neural foramen remains patent.   C4-C5: Disc desiccation with mild annular bulge. Mild facet hypertrophy. No spinal stenosis. Foramina remain patent.   C5-C6: Moderate intervertebral disc space narrowing with diffuse disc osteophyte complex. Flattening and partial effacement of the ventral thecal sac with resultant mild spinal stenosis. No frank cord impingement. Severe left with moderate right C6 foraminal narrowing.   C6-C7: Moderate intervertebral disc space narrowing with diffuse disc osteophyte complex. Broad posterior component flattens and partially faces the ventral thecal sac with no more than mild spinal stenosis. No frank cord impingement. Moderate bilateral C7 foraminal stenosis.   C7-T1: Negative interspace. Mild facet hypertrophy. No canal or foraminal stenosis.   Visualized upper thoracic spine demonstrates no significant finding.   IMPRESSION: 1. Degenerative spondylosis at C5-6 and C6-7 with resultant mild spinal stenosis, with moderate to severe bilateral C6 and C7 foraminal narrowing as above. 2. Mild left C4 foraminal stenosis related to uncovertebral and facet hypertrophy. 3. Chronic microvascular ischemic disease within the visualized pons, with multiple remote lacunar infarcts about the visualized brainstem  and cerebellum.     Electronically Signed   By: Rise Mu M.D.   On: 07/22/2021 00:44  Imaging: US Guided Needle Placement - No Linked Charges  Result Date: 11/02/2022 Ultrasound imaging demonstrates needle placement into the left shoulder with extravasation of fluid and no complicating features    PMFS History: Patient Active Problem List   Diagnosis Date Noted   Immunization counseling 06/25/2022   CVA (cerebral vascular accident) (HCC) 05/23/2022   COPD exacerbation (HCC) 02/06/2022   Ventral hernia without obstruction or gangrene 01/12/2022   Gastroenteritis 01/12/2022   Hematochezia 01/12/2022   Right inguinal hernia 01/08/2022   Right lower quadrant abdominal mass 01/03/2022   Recurrent UTI 09/08/2021   Neural foraminal stenosis of cervical spine 09/08/2021   COPD (chronic obstructive pulmonary disease) (HCC) 06/21/2021   Skin cancer 10/12/2020   Nevus 09/30/2020   GERD (gastroesophageal reflux disease) 09/30/2020   Cerebrovascular accident (CVA) due to embolism of left middle cerebral artery (HCC) 06/14/2020   B12 deficiency 06/14/2020   History of tobacco abuse 06/14/2020   Hyperlipidemia 06/14/2020   Essential hypertension 06/14/2020   Past Medical History:  Diagnosis Date   Colitis    CVA (cerebral vascular accident) (HCC) 05/2020   Hypertension     Family History  Problem Relation Age of Onset   Lung cancer Mother    Lung cancer Father    Obesity Sister    Hypertension Sister        Karlene Lineman   Lung cancer Paternal Grandfather     Past Surgical History:  Procedure Laterality Date   ABDOMINAL HYSTERECTOMY     ELBOW SURGERY Right 2011  tennis elbow   FEMORAL HERNIA REPAIR  02/2022   FOOT SURGERY Right    big toe- pinning due to fracture   HAND SURGERY Left    thumb surgery   UMBILICAL HERNIA REPAIR  02/2022   Social History   Occupational History   Not on file  Tobacco Use   Smoking status: Former    Packs/day: 0.50     Years: 20.00    Additional pack years: 0.00    Total pack years: 10.00    Types: Cigarettes    Quit date: 05/18/2021    Years since quitting: 1.4   Smokeless tobacco: Never  Substance and Sexual Activity   Alcohol use: Not Currently   Drug use: Never   Sexual activity: Not Currently

## 2022-11-03 MED ORDER — BUPIVACAINE HCL 0.5 % IJ SOLN
9.0000 mL | INTRAMUSCULAR | Status: AC | PRN
  Administered 2022-11-02: 9 mL via INTRA_ARTICULAR

## 2022-11-03 MED ORDER — METHYLPREDNISOLONE ACETATE 40 MG/ML IJ SUSP
40.0000 mg | INTRAMUSCULAR | Status: AC | PRN
  Administered 2022-11-02: 40 mg via INTRA_ARTICULAR

## 2022-11-03 MED ORDER — LIDOCAINE HCL 1 % IJ SOLN
5.0000 mL | INTRAMUSCULAR | Status: AC | PRN
  Administered 2022-11-02: 5 mL

## 2022-12-20 ENCOUNTER — Other Ambulatory Visit: Payer: Self-pay | Admitting: Family

## 2022-12-26 ENCOUNTER — Other Ambulatory Visit: Payer: Self-pay | Admitting: Family

## 2023-02-05 ENCOUNTER — Encounter: Payer: Self-pay | Admitting: Family

## 2023-02-06 ENCOUNTER — Other Ambulatory Visit: Payer: Self-pay

## 2023-02-06 MED ORDER — POTASSIUM CHLORIDE CRYS ER 20 MEQ PO TBCR: 20.0000 meq | EXTENDED_RELEASE_TABLET | Freq: Every day | ORAL | 1 refills | Status: DC

## 2023-02-17 ENCOUNTER — Other Ambulatory Visit: Payer: Self-pay | Admitting: Family

## 2023-02-17 NOTE — Telephone Encounter (Signed)
Please contact pt to schedule a follow up visit.  

## 2023-02-19 NOTE — Telephone Encounter (Signed)
Pt will call back to schedule once she has active ins.

## 2023-02-20 ENCOUNTER — Other Ambulatory Visit: Payer: Self-pay | Admitting: Family

## 2023-03-15 ENCOUNTER — Telehealth: Payer: Self-pay | Admitting: Family

## 2023-03-15 NOTE — Telephone Encounter (Signed)
Pt does not have ins right now and stated it will take her 2 months to for medicare B to be eft. Scheduled her in January.

## 2023-03-15 NOTE — Telephone Encounter (Signed)
Please contact pt to schedule a follow up visit.  

## 2023-03-16 ENCOUNTER — Other Ambulatory Visit: Payer: Self-pay | Admitting: Family

## 2023-03-21 ENCOUNTER — Other Ambulatory Visit: Payer: Self-pay | Admitting: Family

## 2023-04-09 ENCOUNTER — Other Ambulatory Visit: Payer: Self-pay | Admitting: Family

## 2023-05-06 ENCOUNTER — Other Ambulatory Visit: Payer: Self-pay | Admitting: Family

## 2023-05-17 ENCOUNTER — Other Ambulatory Visit: Payer: Self-pay | Admitting: Family

## 2023-05-19 ENCOUNTER — Other Ambulatory Visit: Payer: Self-pay | Admitting: Family

## 2023-05-22 ENCOUNTER — Ambulatory Visit: Payer: Medicaid Other | Admitting: Family

## 2023-06-10 ENCOUNTER — Ambulatory Visit (INDEPENDENT_AMBULATORY_CARE_PROVIDER_SITE_OTHER): Payer: Self-pay | Admitting: Family

## 2023-06-10 VITALS — BP 138/84 | HR 62 | Temp 98.6°F | Resp 16 | Ht 65.0 in | Wt 173.0 lb

## 2023-06-10 DIAGNOSIS — J449 Chronic obstructive pulmonary disease, unspecified: Secondary | ICD-10-CM

## 2023-06-10 DIAGNOSIS — B078 Other viral warts: Secondary | ICD-10-CM | POA: Insufficient documentation

## 2023-06-10 DIAGNOSIS — I10 Essential (primary) hypertension: Secondary | ICD-10-CM

## 2023-06-10 DIAGNOSIS — K219 Gastro-esophageal reflux disease without esophagitis: Secondary | ICD-10-CM

## 2023-06-10 DIAGNOSIS — E538 Deficiency of other specified B group vitamins: Secondary | ICD-10-CM

## 2023-06-10 DIAGNOSIS — N390 Urinary tract infection, site not specified: Secondary | ICD-10-CM

## 2023-06-10 DIAGNOSIS — Z1231 Encounter for screening mammogram for malignant neoplasm of breast: Secondary | ICD-10-CM

## 2023-06-10 DIAGNOSIS — E785 Hyperlipidemia, unspecified: Secondary | ICD-10-CM

## 2023-06-10 DIAGNOSIS — I639 Cerebral infarction, unspecified: Secondary | ICD-10-CM

## 2023-06-10 DIAGNOSIS — Z780 Asymptomatic menopausal state: Secondary | ICD-10-CM

## 2023-06-10 DIAGNOSIS — Z1211 Encounter for screening for malignant neoplasm of colon: Secondary | ICD-10-CM

## 2023-06-10 DIAGNOSIS — M4802 Spinal stenosis, cervical region: Secondary | ICD-10-CM

## 2023-06-10 MED ORDER — POTASSIUM CHLORIDE CRYS ER 20 MEQ PO TBCR: 20.0000 meq | EXTENDED_RELEASE_TABLET | Freq: Every day | ORAL | 1 refills | Status: DC

## 2023-06-10 MED ORDER — HYDROCHLOROTHIAZIDE 25 MG PO TABS: 25.0000 mg | ORAL_TABLET | Freq: Every day | ORAL | 1 refills | Status: DC

## 2023-06-10 MED ORDER — ALBUTEROL SULFATE HFA 108 (90 BASE) MCG/ACT IN AERS
2.0000 | INHALATION_SPRAY | Freq: Four times a day (QID) | RESPIRATORY_TRACT | 3 refills | Status: DC | PRN
Start: 2023-06-10 — End: 2024-01-24

## 2023-06-10 MED ORDER — PANTOPRAZOLE SODIUM 40 MG PO TBEC
40.0000 mg | DELAYED_RELEASE_TABLET | Freq: Every day | ORAL | 1 refills | Status: DC
Start: 2023-06-10 — End: 2023-12-30

## 2023-06-10 MED ORDER — LISINOPRIL 20 MG PO TABS: 20.0000 mg | ORAL_TABLET | Freq: Every day | ORAL | 1 refills | Status: DC

## 2023-06-10 NOTE — Progress Notes (Signed)
k  Subjective:     Patient ID: Dawn Lowery, female    DOB: 1950-06-08, 73 y.o.   MRN: [id number]  Chief Complaint  Patient presents with   Hypertension    Here for follow up    HPI  Discussed the use of AI scribe software for clinical note transcription with the patient, who gave verbal consent to proceed.  History of Present Illness   The patient, with a history of hypertension, asthma, and gastroesophageal reflux disease, presents with a recent upper respiratory infection, described as a "little cold." She reports a mild "sniffle" and has been self-treating with over-the-counter medications. The patient also mentions occasional wheezing at night and the need to clear her throat, which she attributes to the recent cold.  The patient is currently on hydrochlorothiazide, lisinopril, and a powder inhaler (Breo), but expresses concern about the cost of the inhaler, which she reports as being around $1000. She is down to her last seven doses and is seeking a less expensive alternative.  The patient also reports a persistent wart that has been resistant to over-the-counter treatments. She describes it as "stubborn" and notes that it has been present for several years and seems to be getting larger.  In terms of preventive care, the patient is open to Cologuard testing and mammograms, but expresses some hesitation about bone density testing. She is currently taking oral B12, pantoprazole, and a full 325mg  aspirin daily for stroke prevention.  The patient has not yet received her flu shot or COVID booster for the season due to a lapse in insurance coverage, which she expects to regain in February. She plans to get both shots as soon as her insurance is active.          Health Maintenance Due  Topic Date Due   Colonoscopy  Never done   DEXA SCAN  Never done   COVID-19 Vaccine (3 - Moderna risk series) 03/20/2021   MAMMOGRAM  10/12/2022   INFLUENZA VACCINE  12/13/2022    Past Medical  History:  Diagnosis Date   Colitis    CVA (cerebral vascular accident) (HCC) 05/2020   Hypertension     Past Surgical History:  Procedure Laterality Date   ABDOMINAL HYSTERECTOMY     ELBOW SURGERY Right 2011   tennis elbow   FEMORAL HERNIA REPAIR  02/2022   FOOT SURGERY Right    big toe- pinning due to fracture   HAND SURGERY Left    thumb surgery   UMBILICAL HERNIA REPAIR  02/2022    Family History  Problem Relation Age of Onset   Lung cancer Mother    Lung cancer Father    Obesity Sister    Hypertension Sister        Karlene Lineman   Lung cancer Paternal Grandfather     Social History   Socioeconomic History   Marital status: Divorced    Spouse name: Not on file   Number of children: Not on file   Years of education: Not on file   Highest education level: 12th grade  Occupational History   Not on file  Tobacco Use   Smoking status: Former    Current packs/day: 0.00    Average packs/day: 0.5 packs/day for 20.0 years (10.0 ttl pk-yrs)    Types: Cigarettes    Start date: 05/18/2001    Quit date: 05/18/2021    Years since quitting: 2.0   Smokeless tobacco: Never  Substance and Sexual Activity   Alcohol use: Not  Currently   Drug use: Never   Sexual activity: Not Currently  Other Topics Concern   Not on file  Social History Narrative   Retired Engineering geologist   Son- lives in [place]   Daughter lives locally   5 grandchildren and 1 great grandson   Enjoys spending time with her daughter and relaxing.    2 dogs   Single   Completed HS   Social Drivers of Corporate investment banker Strain: Low Risk  (06/03/2023)   Overall Financial Resource Strain (CARDIA)    Difficulty of Paying Living Expenses: Not very hard  Food Insecurity: Food Insecurity Present (06/03/2023)   Hunger Vital Sign    Worried About Running Out of Food in the Last Year: Sometimes true    Ran Out of Food in the Last Year: Sometimes true  Transportation Needs: No Transportation Needs  (06/03/2023)   PRAPARE - Administrator, Civil Service (Medical): No    Lack of Transportation (Non-Medical): No  Physical Activity: Insufficiently Active (06/03/2023)   Exercise Vital Sign    Days of Exercise per Week: 7 days    Minutes of Exercise per Session: 20 min  Stress: No Stress Concern Present (06/03/2023)   [employer] of Occupational Health - Occupational Stress Questionnaire    Feeling of Stress : Not at all  Social Connections: Unknown (06/03/2023)   Social Connection and Isolation Panel [NHANES]    Frequency of Communication with Friends and Family: More than three times a week    Frequency of Social Gatherings with Friends and Family: Once a week    Attends Religious Services: Patient declined    Active Member of Clubs or Organizations: No    Attends Banker Meetings: Not on file    Marital Status: Divorced  Intimate Partner Violence: Low Risk  (05/18/2022)   Received from [facility] visits prior to 07/14/2022., [facility] visits prior to 07/14/2022.   Safety    How often does anyone, including family and friends, physically hurt you?: Never    How often does anyone, including family and friends, insult or talk down to you?: Never    How often does anyone, including family and friends, threaten you with harm?: Never    How often does anyone, including family and friends, scream or curse at you?: Never    Outpatient Medications Prior to Visit  Medication Sig Dispense Refill   aspirin EC 325 MG tablet Take 325 mg by mouth daily.     atorvastatin (LIPITOR) 80 MG tablet Take 1 tablet by mouth once daily 90 tablet 0   BREO ELLIPTA 100-25 MCG/ACT AEPB Inhale 1 puff by mouth once daily 60 each 0   estradiol (ESTRACE VAGINAL) 0.1 MG/GM vaginal cream Insert 2 g daily intravaginally for 2 weeks, followed by a maintenance dose of 1 g two 42.5 g 12   vitamin B-12 (CYANOCOBALAMIN) 1000 MCG tablet Take 1 tablet  (1,000 mcg total) by mouth daily.     albuterol (VENTOLIN HFA) 108 (90 Base) MCG/ACT inhaler Inhale 2 puffs into the lungs every 6 (six) hours as needed for wheezing or shortness of breath. 8 g 3   hydrochlorothiazide (HYDRODIURIL) 25 MG tablet Take 1 tablet (25 mg total) by mouth daily. 30 tablet 0   lisinopril (ZESTRIL) 20 MG tablet Take 1 tablet (20 mg total) by mouth daily. 90 tablet 0   pantoprazole (PROTONIX) 40 MG tablet Take 1 tablet by  mouth once daily 90 tablet 0   potassium chloride SA (KLOR-CON M) 20 MEQ tablet Take 1 tablet (20 mEq total) by mouth daily. 90 tablet 1   No facility-administered medications prior to visit.    Allergies  Allergen Reactions   Augmentin [Amoxicillin-Pot Clavulanate] Hives and Itching   Ciprofloxacin     ? Chest tightness   Keflex [Cephalexin]     itching   Macrobid [Nitrofurantoin]     ? Chest tightness   Morphine And Codeine Nausea And Vomiting   Sulfa Antibiotics Hives    ROS    See HPI Objective:    Physical Exam Constitutional:      General: She is not in acute distress.    Appearance: Normal appearance. She is well-developed.  HENT:     Head: Normocephalic and atraumatic.     Right Ear: External ear normal.     Left Ear: External ear normal.  Eyes:     General: No scleral icterus. Neck:     Thyroid: No thyromegaly.  Cardiovascular:     Rate and Rhythm: Normal rate and regular rhythm.     Heart sounds: Normal heart sounds. No murmur heard. Pulmonary:     Effort: Pulmonary effort is normal. No respiratory distress.     Breath sounds: Normal breath sounds. No wheezing.  Musculoskeletal:     Cervical back: Neck supple.  Skin:    General: Skin is warm and dry.  Neurological:     Mental Status: She is alert and oriented to person, place, and time.  Psychiatric:        Mood and Affect: Mood normal.        Behavior: Behavior normal.        Thought Content: Thought content normal.        Judgment: Judgment normal.       BP 138/84   Pulse 62   Temp 98.6 F (37 C) (Oral)   Resp 16   Ht 5\' 5"  (1.651 m)   Wt 173 lb (78.5 kg)   SpO2 99%   BMI 28.79 kg/m  Wt Readings from Last 3 Encounters:  06/10/23 173 lb (78.5 kg)  06/25/22 151 lb (68.5 kg)  05/23/22 146 lb (66.2 kg)       Assessment & Plan:   Problem List Items Addressed This Visit       Unprioritized   Recurrent UTI   No current symptoms. Monitor.       Neural foraminal stenosis of cervical spine   No recent back issues.       Hyperlipidemia   Lab Results  Component Value Date   CHOL 136 12/06/2021   HDL 50.40 12/06/2021   LDLCALC 66 12/06/2021   TRIG 97.0 12/06/2021   CHOLHDL 3 12/06/2021   Maintained on lipitor 80mg .  Will update lipid panel.       Relevant Medications   lisinopril (ZESTRIL) 20 MG tablet   hydrochlorothiazide (HYDRODIURIL) 25 MG tablet   GERD (gastroesophageal reflux disease)   Stable on pantoprazole, continue same.       Relevant Medications   pantoprazole (PROTONIX) 40 MG tablet   Essential hypertension   Initial bp is elevated, repeat bp ok. Continue lisinopril and hydrochlorothiazide.       Relevant Medications   potassium chloride SA (KLOR-CON M) 20 MEQ tablet   lisinopril (ZESTRIL) 20 MG tablet   hydrochlorothiazide (HYDRODIURIL) 25 MG tablet   CVA (cerebral vascular accident) (HCC)   Continue aspirin and statin for  secondary prevention.        Relevant Medications   lisinopril (ZESTRIL) 20 MG tablet   hydrochlorothiazide (HYDRODIURIL) 25 MG tablet   COPD (chronic obstructive pulmonary disease) (HCC)   She reports breo cost is >$1000. She reports that she has new insurance beginning 3/1.  I have asked her to contact her insurance to see if they have a preferred medication on formulary.        Relevant Medications   albuterol (VENTOLIN HFA) 108 (90 Base) MCG/ACT inhaler   Common wart   Left middle finger. Plan to perform freeze treatment next visit when her insurance is  active.       B12 deficiency   Continues b12 oral supplement.        Other Visit Diagnoses       Screening for colon cancer    -  Primary   Relevant Orders   Cologuard     Breast cancer screening by mammogram       Relevant Orders   MM 3D SCREENING MAMMOGRAM BILATERAL BREAST     Postmenopausal estrogen deficiency       Relevant Orders   DG Bone Density     Plan to check cmet, lipid panel and b12 next visit when her insurance is active.   I have changed Dawn Mans. Pyatt "[name]"'s pantoprazole. I am also having her maintain her cyanocobalamin, estradiol, aspirin EC, Breo Ellipta, atorvastatin, potassium chloride SA, lisinopril, hydrochlorothiazide, and albuterol.  Meds ordered this encounter  Medications   potassium chloride SA (KLOR-CON M) 20 MEQ tablet    Sig: Take 1 tablet (20 mEq total) by mouth daily.    Dispense:  90 tablet    Refill:  1    Supervising Provider:   Danise Edge A [[id number]]   pantoprazole (PROTONIX) 40 MG tablet    Sig: Take 1 tablet (40 mg total) by mouth daily.    Dispense:  90 tablet    Refill:  1    Supervising Provider:   Danise Edge A [[id number]]   lisinopril (ZESTRIL) 20 MG tablet    Sig: Take 1 tablet (20 mg total) by mouth daily.    Dispense:  90 tablet    Refill:  1    Supervising Provider:   Danise Edge A [[id number]]   hydrochlorothiazide (HYDRODIURIL) 25 MG tablet    Sig: Take 1 tablet (25 mg total) by mouth daily.    Dispense:  90 tablet    Refill:  1    Supervising Provider:   Danise Edge A [[id number]]   albuterol (VENTOLIN HFA) 108 (90 Base) MCG/ACT inhaler    Sig: Inhale 2 puffs into the lungs every 6 (six) hours as needed for wheezing or shortness of breath.    Dispense:  8 g    Refill:  3    Supervising Provider:   Danise Edge A [[id number]]

## 2023-06-10 NOTE — Assessment & Plan Note (Signed)
Lab Results  Component Value Date   CHOL 136 12/06/2021   HDL 50.40 12/06/2021   LDLCALC 66 12/06/2021   TRIG 97.0 12/06/2021   CHOLHDL 3 12/06/2021   Maintained on lipitor 80mg .  Will update lipid panel.

## 2023-06-10 NOTE — Assessment & Plan Note (Signed)
Stable on pantoprazole, continue same.

## 2023-06-10 NOTE — Assessment & Plan Note (Signed)
Initial bp is elevated, repeat bp ok. Continue lisinopril and hydrochlorothiazide.

## 2023-06-10 NOTE — Assessment & Plan Note (Signed)
No recent back issues.

## 2023-06-10 NOTE — Assessment & Plan Note (Signed)
No current symptoms. Monitor.

## 2023-06-10 NOTE — Assessment & Plan Note (Signed)
Continues b12 oral supplement.

## 2023-06-10 NOTE — Patient Instructions (Signed)
VISIT SUMMARY:  During today's visit, we discussed your recent upper respiratory infection, concerns about your asthma medication costs, and a persistent wart. We also reviewed your preventive care needs, including flu and COVID vaccinations, and upcoming screenings. Your current medications and their effectiveness were evaluated, and plans were made for follow-up care once your insurance is active.  YOUR PLAN:  -HYPERTENSION: Hypertension means high blood pressure. Your blood pressure was elevated today at 158/82. We will continue your current medications, Lisinopril and Hydrochlorothiazide, as they seem to be working well for you.  -ASTHMA/COPD: Asthma is a condition where your airways narrow and swell, causing difficulty in breathing. You mentioned the high cost of your Breo inhaler. Please contact your insurance to find a less expensive alternative and inform our office once you have identified one so we can prescribe it.  -VITAMIN B12 DEFICIENCY: Vitamin B12 deficiency means you have low levels of Vitamin B12, which is important for nerve function and the production of red blood cells. We will check your B12 levels today to ensure your current oral supplementation of daily is effective.  -PREVENTIVE CARE: Preventive care includes measures taken to prevent diseases. You are due for a flu shot, COVID booster, mammogram, and bone density scan. Please get your flu shot and COVID booster once your insurance is active in February. We will also order your mammogram and bone density scan at that time.  -DERMATOLOGY: You have a persistent wart on your foot that has not responded to over-the-counter treatments. We plan to freeze the wart at your next visit after your insurance is active.  -HYPERLIPIDEMIA: Hyperlipidemia means you have high levels of fats (lipids) in your blood, which can increase your risk of heart disease. You are currently on Lipitor 80mg  daily. We will check your cholesterol  levels at your next visit after your insurance is active.  -GASTROESOPHAGEAL REFLUX DISEASE (GERD): GERD is a condition where stomach acid frequently flows back into the tube connecting your mouth and stomach, causing irritation. Your symptoms are well controlled with Pantoprazole, so we will continue this medication.  -CARDIOVASCULAR DISEASE PREVENTION: You are taking Aspirin 325mg  daily to prevent stroke, which is a condition where blood flow to your brain is interrupted. We will continue this medication as it is important for your stroke prevention.  INSTRUCTIONS:  Please follow up in a few weeks after your insurance is active to address the issues discussed today, including the wart treatment, cholesterol check, and preventive screenings.

## 2023-06-10 NOTE — Assessment & Plan Note (Signed)
She reports breo cost is >$1000. She reports that she has new insurance beginning 3/1.  I have asked her to contact her insurance to see if they have a preferred medication on formulary.

## 2023-06-10 NOTE — Assessment & Plan Note (Signed)
Left middle finger. Plan to perform freeze treatment next visit when her insurance is active.

## 2023-06-10 NOTE — Assessment & Plan Note (Signed)
Continue aspirin and statin for secondary prevention.

## 2023-06-18 ENCOUNTER — Inpatient Hospital Stay (HOSPITAL_BASED_OUTPATIENT_CLINIC_OR_DEPARTMENT_OTHER): Admission: RE | Admit: 2023-06-18 | Payer: Self-pay | Source: Ambulatory Visit

## 2023-06-18 ENCOUNTER — Other Ambulatory Visit (HOSPITAL_BASED_OUTPATIENT_CLINIC_OR_DEPARTMENT_OTHER): Payer: Self-pay

## 2023-06-24 ENCOUNTER — Ambulatory Visit (HOSPITAL_BASED_OUTPATIENT_CLINIC_OR_DEPARTMENT_OTHER)
Admission: RE | Admit: 2023-06-24 | Discharge: 2023-06-24 | Disposition: A | Discharge status: Home / Self Care | Payer: Medicare Other | Source: Ambulatory Visit | Attending: Family | Admitting: Family

## 2023-06-24 ENCOUNTER — Encounter: Payer: Self-pay | Admitting: Family

## 2023-06-24 ENCOUNTER — Encounter (HOSPITAL_BASED_OUTPATIENT_CLINIC_OR_DEPARTMENT_OTHER): Payer: Self-pay

## 2023-06-24 ENCOUNTER — Other Ambulatory Visit: Payer: Self-pay | Admitting: Family

## 2023-06-24 ENCOUNTER — Ambulatory Visit: Payer: Medicare Other | Admitting: Family

## 2023-06-24 VITALS — BP 110/65 | HR 69 | Temp 99.5°F | Resp 16 | Ht 65.0 in | Wt 170.0 lb

## 2023-06-24 DIAGNOSIS — Z780 Asymptomatic menopausal state: Secondary | ICD-10-CM | POA: Insufficient documentation

## 2023-06-24 DIAGNOSIS — I10 Essential (primary) hypertension: Secondary | ICD-10-CM | POA: Diagnosis not present

## 2023-06-24 DIAGNOSIS — B078 Other viral warts: Secondary | ICD-10-CM | POA: Diagnosis not present

## 2023-06-24 DIAGNOSIS — Z1231 Encounter for screening mammogram for malignant neoplasm of breast: Secondary | ICD-10-CM | POA: Diagnosis present

## 2023-06-24 DIAGNOSIS — E785 Hyperlipidemia, unspecified: Secondary | ICD-10-CM

## 2023-06-24 DIAGNOSIS — M810 Age-related osteoporosis without current pathological fracture: Secondary | ICD-10-CM

## 2023-06-24 LAB — COMPREHENSIVE METABOLIC PANEL
ALT: 19 U/L (ref 0–35)
AST: 25 U/L (ref 0–37)
Albumin: 4.3 g/dL (ref 3.5–5.2)
Alkaline Phosphatase: 81 U/L (ref 39–117)
BUN: 11 mg/dL (ref 6–23)
CO2: 29 meq/L (ref 19–32)
Calcium: 9.2 mg/dL (ref 8.4–10.5)
Chloride: 100 meq/L (ref 96–112)
Creatinine, Ser: 0.73 mg/dL (ref 0.40–1.20)
GFR: 82.08 mL/min (ref >60.00)
Glucose, Bld: 81 mg/dL (ref 70–99)
Potassium: 4 meq/L (ref 3.5–5.1)
Sodium: 140 meq/L (ref 135–145)
Total Bilirubin: 0.7 mg/dL (ref 0.2–1.2)
Total Protein: 6.4 g/dL (ref 6.0–8.3)

## 2023-06-24 LAB — LIPID PANEL
Cholesterol: 138 mg/dL (ref 0–200)
HDL: 54.9 mg/dL (ref >39.00)
LDL Cholesterol: 66 mg/dL (ref 0–99)
NonHDL: 82.95
Total CHOL/HDL Ratio: 3
Triglycerides: 84 mg/dL (ref 0.0–149.0)
VLDL: 16.8 mg/dL (ref 0.0–40.0)

## 2023-06-24 MED ORDER — BUDESONIDE-FORMOTEROL FUMARATE 160-4.5 MCG/ACT IN AERO: 1.0000 | INHALATION_SPRAY | Freq: Two times a day (BID) | RESPIRATORY_TRACT | 5 refills | Status: DC

## 2023-06-24 MED ORDER — BREO ELLIPTA 100-25 MCG/ACT IN AEPB: 1.0000 | INHALATION_SPRAY | Freq: Every day | RESPIRATORY_TRACT | 2 refills | Status: DC

## 2023-06-24 NOTE — Telephone Encounter (Signed)
 Bone density shows osteoporosis.    I recommend that she start calcium  in the form of of caltrate 600mg  + D one tablet by mouth twice daily as well as fosamax  70mg  by mouth once a week in the AM- sit upright for 90 minutes following dosage.   In addition, please ensure regular weight bearing exercise such as walking.

## 2023-06-24 NOTE — Assessment & Plan Note (Signed)
 Initial bp was elevated but repeat BP OK. Continue lisinopril  and hydrochlorothiazide .

## 2023-06-24 NOTE — Assessment & Plan Note (Signed)
 Continues atorvastatin , update lipid panel.

## 2023-06-24 NOTE — Patient Instructions (Signed)
 VISIT SUMMARY:  Today, we addressed several of your health concerns, including a persistent wart on your finger, nighttime coughing related to asthma, and elevated blood pressure readings. We also discussed general health maintenance tests.  YOUR PLAN:  -WART ON FINGER: A wart is a small growth on the skin caused by a viral infection. Since over-the-counter treatments have not worked, we performed the first cryotherapy treatment today. You will need 2-3 more treatments spaced 3-4 weeks apart.  -ASTHMA: Asthma is a condition where your airways narrow and swell, causing difficulty in breathing. You have been using a rescue inhaler due to nighttime coughing and ran out of your Breo inhaler. We will submit a prescription for Breo and explore coverage options. If not covered, we will prescribe the individual components separately.  -HYPERTENSION: Hypertension, or high blood pressure, can lead to serious health issues if not managed. Although your blood pressure was normal in the office today, you reported elevated readings at home. We will continue your current medication and check your kidney function and cholesterol levels today.  -GENERAL HEALTH MAINTENANCE: We are conducting routine health maintenance tests, including a Cologuard test for colorectal cancer screening, a mammogram, and a bone density test.  INSTRUCTIONS:  Please follow up for your next cryotherapy treatment in 3-4 weeks. Continue monitoring your blood pressure at home and use your rescue inhaler as needed. We will inform you about the coverage determination for Breo. Complete the Cologuard test at home and attend your mammogram and bone density test appointments today.

## 2023-06-24 NOTE — Progress Notes (Signed)
 Subjective:     Patient ID: Dawn Lowery, female    DOB: Jan 17, 1951, 73 y.o.   MRN: [id number]  Chief Complaint  Patient presents with   Warts    Patient here for freezing of common wart. Middle finger on left hand    HPI  Discussed the use of AI scribe software for clinical note transcription with the patient, who gave verbal consent to proceed.  History of Present Illness   Dawn Lowery "[name]" is a 73 year old female who presents for treatment of a persistent wart on her finger.  She has a persistent wart on her finger that has not improved with over-the-counter treatments such as wart bandages and wart gel. She describes the wart as 'nasty' and is frustrated with its persistence. She has not had this wart frozen before but recalls having a wart surgically removed from the bottom of her foot in the past.  She experiences nighttime coughing, which she attributes to a persistent nasal issue. She uses a rescue inhaler at night, which provides temporary relief, but the coughing persists. Her nose runs intermittently, and she is unsure if this is related to her coughing. She ran out of her Breo inhaler a week ago and is concerned about the cost, which exceeds a hundred dollars per month. In the meantime, she has been using her rescue inhaler. She discusses the possibility of using three separate inhalers as a substitute for Breo if necessary.  Her blood pressure has been elevated recently despite regular medication adherence. She has been monitoring it sporadically over the past month. Additionally, she uses a sleep app on her phone that indicates when her heart rate is elevated during sleep.     Health Maintenance Due  Topic Date Due   Colonoscopy  Never done   DEXA SCAN  Never done   COVID-19 Vaccine (3 - Moderna risk series) 03/20/2021   MAMMOGRAM  10/12/2022   INFLUENZA VACCINE  12/13/2022   Medicare Annual Wellness (AWV)  03/13/2023    Past Medical History:  Diagnosis Date    Colitis    CVA (cerebral vascular accident) (HCC) 05/2020   Hypertension     Past Surgical History:  Procedure Laterality Date   ABDOMINAL HYSTERECTOMY     ELBOW SURGERY Right 2011   tennis elbow   FEMORAL HERNIA REPAIR  02/2022   FOOT SURGERY Right    big toe- pinning due to fracture   HAND SURGERY Left    thumb surgery   UMBILICAL HERNIA REPAIR  02/2022    Family History  Problem Relation Age of Onset   Lung cancer Mother    Lung cancer Father    Obesity Sister    Hypertension Sister        Dawn Lowery   Lung cancer Paternal Grandfather     Social History   Socioeconomic History   Marital status: Divorced    Spouse name: Not on file   Number of children: Not on file   Years of education: Not on file   Highest education level: 12th grade  Occupational History   Not on file  Tobacco Use   Smoking status: Former    Current packs/day: 0.00    Average packs/day: 0.5 packs/day for 20.0 years (10.0 ttl pk-yrs)    Types: Cigarettes    Start date: 05/18/2001    Quit date: 05/18/2021    Years since quitting: 2.1   Smokeless tobacco: Never  Substance and Sexual Activity  Alcohol use: Not Currently   Drug use: Never   Sexual activity: Not Currently  Other Topics Concern   Not on file  Social History Narrative   Retired Engineering geologist   Son- lives in [place]    Daughter lives locally   5 grandchildren and 1 great grandson   Enjoys spending time with her daughter and relaxing.    2 dogs   Single   Completed HS   Social Drivers of Corporate investment banker Strain: Low Risk  (06/03/2023)   Overall Financial Resource Strain (CARDIA)    Difficulty of Paying Living Expenses: Not very hard  Food Insecurity: Food Insecurity Present (06/03/2023)   Hunger Vital Sign    Worried About Running Out of Food in the Last Year: Sometimes true    Ran Out of Food in the Last Year: Sometimes true  Transportation Needs: No Transportation Needs (06/03/2023)   PRAPARE -  Administrator, Civil Service (Medical): No    Lack of Transportation (Non-Medical): No  Physical Activity: Insufficiently Active (06/03/2023)   Exercise Vital Sign    Days of Exercise per Week: 7 days    Minutes of Exercise per Session: 20 min  Stress: No Stress Concern Present (06/03/2023)   [employer] of Occupational Health - Occupational Stress Questionnaire    Feeling of Stress : Not at all  Social Connections: Unknown (06/03/2023)   Social Connection and Isolation Panel [NHANES]    Frequency of Communication with Friends and Family: More than three times a week    Frequency of Social Gatherings with Friends and Family: Once a week    Attends Religious Services: Patient declined    Active Member of Clubs or Organizations: No    Attends Banker Meetings: Not on file    Marital Status: Divorced  Intimate Partner Violence: Low Risk  (05/18/2022)   [facility] from [facility] prior to 07/14/2022., [facility] visits prior to 07/14/2022.   Safety    How often does anyone, including family and friends, physically hurt you?: Never    How often does anyone, including family and friends, insult or talk down to you?: Never    How often does anyone, including family and friends, threaten you with harm?: Never    How often does anyone, including family and friends, scream or curse at you?: Never    Outpatient Medications Prior to Visit  Medication Sig Dispense Refill   albuterol  (VENTOLIN  HFA) 108 (90 Base) MCG/ACT inhaler Inhale 2 puffs into the lungs every 6 (six) hours as needed for wheezing or shortness of breath. 8 g 3   aspirin EC 325 MG tablet Take 325 mg by mouth daily.     atorvastatin  (LIPITOR) 80 MG tablet Take 1 tablet by mouth once daily 90 tablet 0   estradiol  (ESTRACE  VAGINAL) 0.1 MG/GM vaginal cream Insert 2 g daily intravaginally for 2 weeks, followed by a maintenance dose of 1 g two 42.5 g 12    hydrochlorothiazide  (HYDRODIURIL ) 25 MG tablet Take 1 tablet (25 mg total) by mouth daily. 90 tablet 1   lisinopril  (ZESTRIL ) 20 MG tablet Take 1 tablet (20 mg total) by mouth daily. 90 tablet 1   pantoprazole  (PROTONIX ) 40 MG tablet Take 1 tablet (40 mg total) by mouth daily. 90 tablet 1   potassium chloride  SA (KLOR-CON  M) 20 MEQ tablet Take 1 tablet (20 mEq total) by mouth daily. 90 tablet 1   vitamin  B-12 (CYANOCOBALAMIN) 1000 MCG tablet Take 1 tablet (1,000 mcg total) by mouth daily.     BREO ELLIPTA  100-25 MCG/ACT AEPB Inhale 1 puff by mouth once daily 60 each 0   No facility-administered medications prior to visit.    Allergies  Allergen Reactions   Augmentin  [Amoxicillin -Pot Clavulanate] Hives and Itching   Ciprofloxacin      ? Chest tightness   Keflex  [Cephalexin ]     itching   Macrobid  [Nitrofurantoin ]     ? Chest tightness   Morphine And Codeine Nausea And Vomiting   Sulfa Antibiotics Hives    ROS See HPI    Objective:    Physical Exam Constitutional:      General: She is not in acute distress.    Appearance: Normal appearance. She is well-developed.  HENT:     Head: Normocephalic and atraumatic.     Right Ear: External ear normal.     Left Ear: External ear normal.  Eyes:     General: No scleral icterus. Neck:     Thyroid: No thyromegaly.  Cardiovascular:     Rate and Rhythm: Normal rate and regular rhythm.     Heart sounds: Normal heart sounds. No murmur heard. Pulmonary:     Effort: Pulmonary effort is normal. No respiratory distress.     Breath sounds: Normal breath sounds. No wheezing.  Musculoskeletal:     Cervical back: Neck supple.  Skin:    General: Skin is warm and dry.     Comments: Raised wart noted left middle finger  Neurological:     Mental Status: She is alert and oriented to person, place, and time.  Psychiatric:        Mood and Affect: Mood normal.        Behavior: Behavior normal.        Thought Content: Thought content normal.         Judgment: Judgment normal.       BP 110/65   Pulse 69   Temp 99.5 F (37.5 C) (Oral)   Resp 16   Ht 5\' 5"  (1.651 m)   Wt 170 lb (77.1 kg)   SpO2 100%   BMI 28.29 kg/m  Wt Readings from Last 3 Encounters:  06/24/23 170 lb (77.1 kg)  06/10/23 173 lb (78.5 kg)  06/25/22 151 lb (68.5 kg)       Assessment & Plan:   Problem List Items Addressed This Visit       Unprioritized   Hyperlipidemia - Primary   Continues atorvastatin , update lipid panel.       Relevant Orders   Lipid panel   Essential hypertension   Initial bp was elevated but repeat BP OK. Continue lisinopril  and hydrochlorothiazide .       Relevant Orders   Comp Met (CMET)   Common wart   After verbal consent was obtained, wart treated with liquid nitrogen x 3 (freeze/thaw). Plan to repeat in 3 weeks.       I have changed Earleen Glazier. Haskew "[name]"'s Breo Ellipta . I am also having her maintain her cyanocobalamin, estradiol , aspirin EC, atorvastatin , potassium chloride  SA, pantoprazole , lisinopril , hydrochlorothiazide , and albuterol .  Meds ordered this encounter  Medications   BREO ELLIPTA  100-25 MCG/ACT AEPB    Sig: Inhale 1 puff into the lungs daily.    Dispense:  60 each    Refill:  2    Coverage Determination    Supervising Provider:   Randie Bustle A [[id number]]

## 2023-06-24 NOTE — Assessment & Plan Note (Addendum)
 After verbal consent was obtained, wart treated with liquid nitrogen x 3 (freeze/thaw). Plan to repeat in 3 weeks.

## 2023-06-25 ENCOUNTER — Encounter: Payer: Self-pay | Admitting: Family

## 2023-06-25 MED ORDER — FLUTICASONE-SALMETEROL 250-50 MCG/ACT IN AEPB: 1.0000 | INHALATION_SPRAY | Freq: Two times a day (BID) | RESPIRATORY_TRACT | 5 refills | Status: DC

## 2023-06-25 NOTE — Telephone Encounter (Signed)
Spoke to pt. Recommended that she try good rx for Wixela at [facility] which should be around $50. Rx sent.

## 2023-06-25 NOTE — Addendum Note (Signed)
Addended by: Sandford Craze on: 06/25/2023 01:06 PM   Modules accepted: Orders

## 2023-06-26 MED ORDER — FLUTICASONE-SALMETEROL 250-50 MCG/ACT IN AEPB: 1.0000 | INHALATION_SPRAY | Freq: Two times a day (BID) | RESPIRATORY_TRACT | 5 refills | Status: DC

## 2023-06-26 NOTE — Addendum Note (Signed)
Addended byConrad Gifford D on: 06/26/2023 02:59 PM   Modules accepted: Orders

## 2023-07-01 MED ORDER — CALCIUM CARB-CHOLECALCIFEROL 600-20 MG-MCG PO TABS: 1.0000 | ORAL_TABLET | Freq: Two times a day (BID) | ORAL | Status: AC

## 2023-07-01 MED ORDER — ALENDRONATE SODIUM 70 MG PO TABS: 70.0000 mg | ORAL_TABLET | ORAL | 11 refills | Status: AC

## 2023-07-01 NOTE — Telephone Encounter (Signed)
Patient notified of results and new prescriptions

## 2023-07-09 ENCOUNTER — Encounter: Payer: Self-pay | Admitting: Family

## 2023-07-14 LAB — COLOGUARD: COLOGUARD: NEGATIVE

## 2023-07-19 ENCOUNTER — Ambulatory Visit: Payer: Medicare Other | Admitting: Family

## 2023-07-19 ENCOUNTER — Encounter: Payer: Self-pay | Admitting: Family

## 2023-07-19 VITALS — BP 160/90 | HR 63 | Resp 16 | Ht 65.0 in | Wt 173.0 lb

## 2023-07-19 DIAGNOSIS — B078 Other viral warts: Secondary | ICD-10-CM | POA: Diagnosis not present

## 2023-07-19 DIAGNOSIS — I10 Essential (primary) hypertension: Secondary | ICD-10-CM | POA: Diagnosis not present

## 2023-07-19 NOTE — Assessment & Plan Note (Signed)
 BP is high today. Was great last visit. I have asked the pt to recheck her BP this evening and send me her reading via mychart. If still elevated at home, will adjust meds.

## 2023-07-19 NOTE — Progress Notes (Signed)
 Subjective:     Patient ID: Dawn Lowery, female    DOB: [birth year], 73 y.o.   MRN: [id number]  Chief Complaint  Patient presents with   Warts    Here for follow up on wart, middle finger of left hand    HPI  Discussed the use of AI scribe software for clinical note transcription with the patient, who gave verbal consent to proceed.  History of Present Illness   Patient presents today for retreatment of wart on her left middle finger.  Health Maintenance Due  Topic Date Due   Medicare Annual Wellness (AWV)  03/13/2023    Past Medical History:  Diagnosis Date   Colitis    CVA (cerebral vascular accident) (HCC) 05/2020   Hypertension     Past Surgical History:  Procedure Laterality Date   ABDOMINAL HYSTERECTOMY     ELBOW SURGERY Right 2011   tennis elbow   FEMORAL HERNIA REPAIR  02/2022   FOOT SURGERY Right    big toe- pinning due to fracture   HAND SURGERY Left    thumb surgery   UMBILICAL HERNIA REPAIR  02/2022    Family History  Problem Relation Age of Onset   Lung cancer Mother    Lung cancer Father    Obesity Sister    Hypertension Sister        Karlene Lineman   Lung cancer Paternal Grandfather     Social History   Socioeconomic History   Marital status: Divorced    Spouse name: Not on file   Number of children: Not on file   Years of education: Not on file   Highest education level: 12th grade  Occupational History   Not on file  Tobacco Use   Smoking status: Former    Current packs/day: 0.00    Average packs/day: 0.5 packs/day for 20.0 years (10.0 ttl pk-yrs)    Types: Cigarettes    Start date: 05/18/2001    Quit date: 05/18/2021    Years since quitting: 2.1   Smokeless tobacco: Never  Substance and Sexual Activity   Alcohol use: Not Currently   Drug use: Never   Sexual activity: Not Currently  Other Topics Concern   Not on file  Social History Narrative   Retired Engineering geologist   Son- lives in [place]   Daughter lives locally    5 grandchildren and 1 great grandson   Enjoys spending time with her daughter and relaxing.    2 dogs   Single   Completed HS   Social Drivers of Corporate investment banker Strain: Low Risk  (06/03/2023)   Overall Financial Resource Strain (CARDIA)    Difficulty of Paying Living Expenses: Not very hard  Food Insecurity: Food Insecurity Present (06/03/2023)   Hunger Vital Sign    Worried About Running Out of Food in the Last Year: Sometimes true    Ran Out of Food in the Last Year: Sometimes true  Transportation Needs: No Transportation Needs (06/03/2023)   PRAPARE - Administrator, Civil Service (Medical): No    Lack of Transportation (Non-Medical): No  Physical Activity: Insufficiently Active (06/03/2023)   Exercise Vital Sign    Days of Exercise per Week: 7 days    Minutes of Exercise per Session: 20 min  Stress: No Stress Concern Present (06/03/2023)   [employer] of Occupational Health - Occupational Stress Questionnaire    Feeling of Stress : Not at all  Social Connections: Unknown (06/03/2023)  Social Connection and Isolation Panel [NHANES]    Frequency of Communication with Friends and Family: More than three times a week    Frequency of Social Gatherings with Friends and Family: Once a week    Attends Religious Services: Patient declined    Active Member of Clubs or Organizations: No    Attends Banker Meetings: Not on file    Marital Status: Divorced  Intimate Partner Violence: Low Risk  (05/18/2022)   [facility] prior to 07/14/2022., [facility] visits prior to 07/14/2022.   Safety    How often does anyone, including family and friends, physically hurt you?: Never    How often does anyone, including family and friends, insult or talk down to you?: Never    How often does anyone, including family and friends, threaten you with harm?: Never    How often does anyone, including  family and friends, scream or curse at you?: Never    Outpatient Medications Prior to Visit  Medication Sig Dispense Refill   albuterol (VENTOLIN HFA) 108 (90 Base) MCG/ACT inhaler Inhale 2 puffs into the lungs every 6 (six) hours as needed for wheezing or shortness of breath. 8 g 3   alendronate (FOSAMAX) 70 MG tablet Take 1 tablet (70 mg total) by mouth every 7 (seven) days. Take with a full glass of water on an empty stomach. 4 tablet 11   aspirin EC 325 MG tablet Take 325 mg by mouth daily.     atorvastatin (LIPITOR) 80 MG tablet Take 1 tablet by mouth once daily 90 tablet 0   Calcium Carb-Cholecalciferol (CALTRATE 600+D3) 600-20 MG-MCG TABS Take 1 tablet by mouth 2 (two) times daily.     estradiol (ESTRACE VAGINAL) 0.1 MG/GM vaginal cream Insert 2 g daily intravaginally for 2 weeks, followed by a maintenance dose of 1 g two 42.5 g 12   fluticasone-salmeterol (WIXELA INHUB) 250-50 MCG/ACT AEPB Inhale 1 puff into the lungs in the morning and at bedtime. 60 each 5   hydrochlorothiazide (HYDRODIURIL) 25 MG tablet Take 1 tablet (25 mg total) by mouth daily. 90 tablet 1   lisinopril (ZESTRIL) 20 MG tablet Take 1 tablet (20 mg total) by mouth daily. 90 tablet 1   pantoprazole (PROTONIX) 40 MG tablet Take 1 tablet (40 mg total) by mouth daily. 90 tablet 1   potassium chloride SA (KLOR-CON M) 20 MEQ tablet Take 1 tablet (20 mEq total) by mouth daily. 90 tablet 1   vitamin B-12 (CYANOCOBALAMIN) 1000 MCG tablet Take 1 tablet (1,000 mcg total) by mouth daily.     No facility-administered medications prior to visit.    Allergies  Allergen Reactions   Augmentin [Amoxicillin-Pot Clavulanate] Hives and Itching   Ciprofloxacin     ? Chest tightness   Keflex [Cephalexin]     itching   Macrobid [Nitrofurantoin]     ? Chest tightness   Morphine And Codeine Nausea And Vomiting   Sulfa Antibiotics Hives    ROS    See HPI Objective:    Physical Exam Constitutional:      Appearance: Normal  appearance.  Cardiovascular:     Rate and Rhythm: Normal rate.  Pulmonary:     Effort: Pulmonary effort is normal.  Skin:    General: Skin is warm and dry.     Comments: Dry raised wart noted left dorsal middle finger overlying MIP  Neurological:     Mental Status: She is alert  and oriented to person, place, and time.  Psychiatric:        Mood and Affect: Mood normal.        Behavior: Behavior normal.        Thought Content: Thought content normal.        Judgment: Judgment normal.      BP (!) 160/90   Pulse 63   Resp 16   Ht 5\' 5"  (1.651 m)   Wt 173 lb (78.5 kg)   SpO2 100%   BMI 28.79 kg/m  Wt Readings from Last 3 Encounters:  07/19/23 173 lb (78.5 kg)  06/24/23 170 lb (77.1 kg)  06/10/23 173 lb (78.5 kg)       Assessment & Plan:   Problem List Items Addressed This Visit       Unprioritized   Essential hypertension   BP is high today. Was great last visit. I have asked the pt to recheck her BP this evening and send me her reading via mychart. If still elevated at home, will adjust meds.        Common wart - Primary   Unchanged. After verbal consent wart was debrided with dermal blade down to health skin. Then wart was frozen/thawed x 3 using liquid nitrogen. Pt tolerated procedure well.  Advised her apply compound w nightly and use pumace to remove dead skin.  Follow back up in 3 weeks for retreatment.        I am having Megan Mans. Kitchings "[name]" maintain her cyanocobalamin, estradiol, aspirin EC, atorvastatin, potassium chloride SA, pantoprazole, lisinopril, hydrochlorothiazide, albuterol, alendronate, Calcium Carb-Cholecalciferol, and fluticasone-salmeterol.  No orders of the defined types were placed in this encounter.

## 2023-07-19 NOTE — Assessment & Plan Note (Signed)
 Unchanged. After verbal consent wart was debrided with dermal blade down to health skin. Then wart was frozen/thawed x 3 using liquid nitrogen. Pt tolerated procedure well.  Advised her apply compound w nightly and use pumace to remove dead skin.  Follow back up in 3 weeks for retreatment.

## 2023-07-20 ENCOUNTER — Telehealth: Payer: Self-pay | Admitting: Family

## 2023-07-20 NOTE — Telephone Encounter (Signed)
 See mychart.

## 2023-07-22 MED ORDER — AMLODIPINE BESYLATE 2.5 MG PO TABS: 2.5000 mg | ORAL_TABLET | Freq: Every day | ORAL | 0 refills | Status: DC

## 2023-08-09 ENCOUNTER — Ambulatory Visit: Admitting: Family

## 2023-08-09 VITALS — BP 132/77 | HR 60 | Temp 98.7°F | Resp 16 | Ht 65.0 in | Wt 170.0 lb

## 2023-08-09 DIAGNOSIS — I10 Essential (primary) hypertension: Secondary | ICD-10-CM | POA: Diagnosis not present

## 2023-08-09 DIAGNOSIS — B078 Other viral warts: Secondary | ICD-10-CM | POA: Diagnosis not present

## 2023-08-09 NOTE — Progress Notes (Signed)
   Established Patient Office Visit  Subjective   Patient ID: Dawn Lowery, female    DOB: 04-Jun-1950  Age: 73 y.o. MRN: [id number]  Chief Complaint  Patient presents with   Hypertension    Here for follow up after adding Amlodipine 2.5 mg    Very pleasant 73 yo patient returns to the office today for BP and wart recheck. Patient's blood pressures at home have been stable. Patient states that she is tolerating her antihypertensive with no issues. She continues to be concerned about the wart on her left 3rd finger.   Hypertension      ROS  See HPI    Objective:     BP 132/77 (BP Location: Right Arm, Patient Position: Sitting, Cuff Size: Normal)   Pulse 60   Temp 98.7 F (37.1 C) (Oral)   Resp 16   Ht 5\' 5"  (1.651 m)   Wt 170 lb (77.1 kg)   SpO2 100%   BMI 28.29 kg/m    Physical Exam Vitals reviewed.  Constitutional:      Appearance: Normal appearance.  HENT:     Head: Normocephalic and atraumatic.     Right Ear: External ear normal.     Left Ear: External ear normal.     Nose: No congestion or rhinorrhea.  Cardiovascular:     Rate and Rhythm: Normal rate and regular rhythm.     Pulses: Normal pulses.     Heart sounds: Normal heart sounds.  Pulmonary:     Effort: Pulmonary effort is normal.     Breath sounds: Normal breath sounds.  Skin:    General: Skin is warm and dry.     Capillary Refill: Capillary refill takes less than 2 seconds.     Comments: Wart noted to left 3rd finger.   Neurological:     Mental Status: She is alert and oriented to person, place, and time.  Psychiatric:        Mood and Affect: Mood normal.        Behavior: Behavior normal.      Assessment & Plan:   -Hypertension - improved with the addition of amlodipine. Continue amlodipine, hydrochlorothiazide, and lisinopril.   Cristopher Peru, RN

## 2023-08-09 NOTE — Progress Notes (Signed)
 Subjective:     Patient ID: Dawn Lowery, female    DOB: 04-09-1951, 73 y.o.   MRN: [id number]  Chief Complaint  Patient presents with   Hypertension    Here for follow up after adding Amlodipine 2.5 mg    HPI  Discussed the use of AI scribe software for clinical note transcription with the patient, who gave verbal consent to proceed.  History of Present Illness Dawn Lowery "[name]" is a 73 year old female who presents with a persistent wart.  She has had a persistent wart for several years, which has been increasing in size over time. She has attempted various treatments, including acid application, covering it with a Band-Aid at night, and trimming around it. Recently, she applied Neosporin to soften the area, but finds it difficult to completely remove the wart.  She describes the wart as stubborn and notes a history of similar issues, including a plantar wart on her foot that required surgical removal. She recalls a similar experience during college when she and her roommates contracted warts, which she attributes to shared living conditions.  She is currently using amlodipine, which has improved her blood work. No adverse effects from this medication are reported.      Health Maintenance Due  Topic Date Due   Medicare Annual Wellness (AWV)  03/13/2023    Past Medical History:  Diagnosis Date   Colitis    CVA (cerebral vascular accident) (HCC) 05/2020   Hypertension     Past Surgical History:  Procedure Laterality Date   ABDOMINAL HYSTERECTOMY     ELBOW SURGERY Right 2011   tennis elbow   FEMORAL HERNIA REPAIR  02/2022   FOOT SURGERY Right    big toe- pinning due to fracture   HAND SURGERY Left    thumb surgery   UMBILICAL HERNIA REPAIR  02/2022    Family History  Problem Relation Age of Onset   Lung cancer Mother    Lung cancer Father    Obesity Sister    Hypertension Sister        Karlene Lineman   Lung cancer Paternal Grandfather     Social  History   Socioeconomic History   Marital status: Divorced    Spouse name: Not on file   Number of children: Not on file   Years of education: Not on file   Highest education level: 12th grade  Occupational History   Not on file  Tobacco Use   Smoking status: Former    Current packs/day: 0.00    Average packs/day: 0.5 packs/day for 20.0 years (10.0 ttl pk-yrs)    Types: Cigarettes    Start date: 05/18/2001    Quit date: 05/18/2021    Years since quitting: 2.2   Smokeless tobacco: Never  Substance and Sexual Activity   Alcohol use: Not Currently   Drug use: Never   Sexual activity: Not Currently  Other Topics Concern   Not on file  Social History Narrative   Retired Engineering geologist   Son- lives in [place]   Daughter lives locally   5 grandchildren and 1 great grandson   Enjoys spending time with her daughter and relaxing.    2 dogs   Single   Completed HS   Social Drivers of Corporate investment banker Strain: Low Risk  (06/03/2023)   Overall Financial Resource Strain (CARDIA)    Difficulty of Paying Living Expenses: Not very hard  Food Insecurity: Food Insecurity Present (06/03/2023)  Hunger Vital Sign    Worried About Running Out of Food in the Last Year: Sometimes true    Ran Out of Food in the Last Year: Sometimes true  Transportation Needs: No Transportation Needs (06/03/2023)   PRAPARE - Administrator, Civil Service (Medical): No    Lack of Transportation (Non-Medical): No  Physical Activity: Insufficiently Active (06/03/2023)   Exercise Vital Sign    Days of Exercise per Week: 7 days    Minutes of Exercise per Session: 20 min  Stress: No Stress Concern Present (06/03/2023)   [employer] of Occupational Health - Occupational Stress Questionnaire    Feeling of Stress : Not at all  Social Connections: Unknown (06/03/2023)   Social Connection and Isolation Panel [NHANES]    Frequency of Communication with Friends and Family: More than three times a  week    Frequency of Social Gatherings with Friends and Family: Once a week    Attends Religious Services: Patient declined    Active Member of Clubs or Organizations: No    Attends Banker Meetings: Not on file    Marital Status: Divorced  Intimate Partner Violence: Low Risk  (05/18/2022)   Received from [facility] visits prior to 07/14/2022., [facility] visits prior to 07/14/2022.   Safety    How often does anyone, including family and friends, physically hurt you?: Never    How often does anyone, including family and friends, insult or talk down to you?: Never    How often does anyone, including family and friends, threaten you with harm?: Never    How often does anyone, including family and friends, scream or curse at you?: Never    Outpatient Medications Prior to Visit  Medication Sig Dispense Refill   albuterol (VENTOLIN HFA) 108 (90 Base) MCG/ACT inhaler Inhale 2 puffs into the lungs every 6 (six) hours as needed for wheezing or shortness of breath. 8 g 3   alendronate (FOSAMAX) 70 MG tablet Take 1 tablet (70 mg total) by mouth every 7 (seven) days. Take with a full glass of water on an empty stomach. 4 tablet 11   amLODipine (NORVASC) 2.5 MG tablet Take 1 tablet (2.5 mg total) by mouth daily. 90 tablet 0   aspirin EC 325 MG tablet Take 325 mg by mouth daily.     atorvastatin (LIPITOR) 80 MG tablet Take 1 tablet by mouth once daily 90 tablet 0   Calcium Carb-Cholecalciferol (CALTRATE 600+D3) 600-20 MG-MCG TABS Take 1 tablet by mouth 2 (two) times daily.     estradiol (ESTRACE VAGINAL) 0.1 MG/GM vaginal cream Insert 2 g daily intravaginally for 2 weeks, followed by a maintenance dose of 1 g two 42.5 g 12   fluticasone-salmeterol (WIXELA INHUB) 250-50 MCG/ACT AEPB Inhale 1 puff into the lungs in the morning and at bedtime. 60 each 5   hydrochlorothiazide (HYDRODIURIL) 25 MG tablet Take 1 tablet (25 mg total) by mouth daily. 90 tablet  1   lisinopril (ZESTRIL) 20 MG tablet Take 1 tablet (20 mg total) by mouth daily. 90 tablet 1   pantoprazole (PROTONIX) 40 MG tablet Take 1 tablet (40 mg total) by mouth daily. 90 tablet 1   potassium chloride SA (KLOR-CON M) 20 MEQ tablet Take 1 tablet (20 mEq total) by mouth daily. 90 tablet 1   vitamin B-12 (CYANOCOBALAMIN) 1000 MCG tablet Take 1 tablet (1,000 mcg total) by mouth daily.     No facility-administered medications  prior to visit.    Allergies  Allergen Reactions   Augmentin [Amoxicillin-Pot Clavulanate] Hives and Itching   Ciprofloxacin     ? Chest tightness   Keflex [Cephalexin]     itching   Macrobid [Nitrofurantoin]     ? Chest tightness   Morphine And Codeine Nausea And Vomiting   Sulfa Antibiotics Hives    ROS See HPI    Objective:    Physical Exam Constitutional:      Appearance: Normal appearance.  Cardiovascular:     Rate and Rhythm: Normal rate.  Pulmonary:     Effort: Pulmonary effort is normal.  Musculoskeletal:        General: No swelling.  Skin:    Comments: Decrease in wart size noted overlying left dorsal MIP joint middle finger  Neurological:     Mental Status: She is alert and oriented to person, place, and time.  Psychiatric:        Mood and Affect: Mood normal.        Behavior: Behavior normal.        Thought Content: Thought content normal.        Judgment: Judgment normal.      BP 132/77 (BP Location: Right Arm, Patient Position: Sitting, Cuff Size: Normal)   Pulse 60   Temp 98.7 F (37.1 C) (Oral)   Resp 16   Ht 5\' 5"  (1.651 m)   Wt 170 lb (77.1 kg)   SpO2 100%   BMI 28.29 kg/m  Wt Readings from Last 3 Encounters:  08/09/23 170 lb (77.1 kg)  07/19/23 173 lb (78.5 kg)  06/24/23 170 lb (77.1 kg)       Assessment & Plan:   Problem List Items Addressed This Visit       Unprioritized   Essential hypertension   BP is better with the addition of amlodipine 2.5mg  once daily. Continue amlodipne, hydrochlorothiazide,  and lisinopril.       Common wart - Primary   After verbal consent was obtained, wart was sharply debrided using sterile scalpel and then frozen using liquid nitrogen (freeze/thaw x 3). Pt tolerated procedure well. She will continue to apply compound W nightly and return in 3 weeks for re-treatment.        I am having Megan Mans. Armbrister "[name]" maintain her cyanocobalamin, estradiol, aspirin EC, atorvastatin, potassium chloride SA, pantoprazole, lisinopril, hydrochlorothiazide, albuterol, alendronate, Calcium Carb-Cholecalciferol, fluticasone-salmeterol, and amLODipine.  No orders of the defined types were placed in this encounter.

## 2023-08-09 NOTE — Patient Instructions (Signed)
 VISIT SUMMARY:  Today, you were seen for a persistent wart that has been increasing in size over the years. You have tried various treatments, and we discussed your history of similar issues. We also reviewed your current medication for hypertension.  YOUR PLAN:  -VERRUCA (WART): A verruca is a wart caused by a viral infection in the skin. Your long-standing wart has been resistant to treatment but is reducing in size with your current therapy. Continue using the topical acid treatment and applying a Band-Aid at night. Today, we debrided the wart and applied cryotherapy with liquid nitrogen. We will reassess in 3 weeks to monitor progress.  -HYPERTENSION: Hypertension is high blood pressure. Your condition is being managed well with amlodipine, and your recent blood pressure. Continue taking amlodipine as prescribed.  INSTRUCTIONS:  Please return for a follow-up appointment in 3 weeks to reassess the wart. Continue your current treatments and medication as discussed.

## 2023-08-09 NOTE — Assessment & Plan Note (Signed)
 After verbal consent was obtained, wart was sharply debrided using sterile scalpel and then frozen using liquid nitrogen (freeze/thaw x 3). Pt tolerated procedure well. She will continue to apply compound W nightly and return in 3 weeks for re-treatment.

## 2023-08-09 NOTE — Assessment & Plan Note (Addendum)
 BP is better with the addition of amlodipine 2.5mg  once daily. Continue amlodipne, hydrochlorothiazide, and lisinopril.

## 2023-08-14 ENCOUNTER — Other Ambulatory Visit: Payer: Self-pay | Admitting: Family

## 2023-08-29 ENCOUNTER — Encounter: Payer: Self-pay | Admitting: Family

## 2023-09-03 ENCOUNTER — Ambulatory Visit: Admitting: Family

## 2023-09-04 ENCOUNTER — Ambulatory Visit (INDEPENDENT_AMBULATORY_CARE_PROVIDER_SITE_OTHER)

## 2023-09-04 VITALS — BP 132/77 | Ht 65.0 in | Wt 165.0 lb

## 2023-09-04 DIAGNOSIS — Z0000 Encounter for general adult medical examination without abnormal findings: Secondary | ICD-10-CM

## 2023-09-04 NOTE — Progress Notes (Signed)
 Because this visit was a virtual/telehealth visit,  certain criteria was not obtained, such a blood pressure, CBG if applicable, and timed get up and go. Any medications not marked as "taking" were not mentioned during the medication reconciliation part of the visit. Any vitals not documented were not able to be obtained due to this being a telehealth visit or patient was unable to self-report a recent blood pressure reading due to a lack of equipment at home via telehealth. Vitals that have been documented are verbally provided by the patient.  Subjective:   Dawn Lowery is a 73 y.o. who presents for a Medicare Wellness preventive visit.  Visit Complete: Virtual I connected with  Dawn Lowery on 09/04/23 by a audio enabled telemedicine application and verified that I am speaking with the correct person using two identifiers.  Patient Location: Home  Provider Location: Home Office  I discussed the limitations of evaluation and management by telemedicine. The patient expressed understanding and agreed to proceed.  Vital Signs: Because this visit was a virtual/telehealth visit, some criteria may be missing or patient reported. Any vitals not documented were not able to be obtained and vitals that have been documented are patient reported.  VideoDeclined- This patient declined Librarian, academic. Therefore the visit was completed with audio only.  Persons Participating in Visit: Patient.  AWV Lowery: No: Patient Medicare AWV Lowery was not completed prior to this visit.  Cardiac Risk Factors include: advanced age (>30men, >77 women);hypertension;Other (see comment);dyslipidemia, Risk factor comments: copd     Objective:    Today's Vitals   09/04/23 1527  BP: 132/77  Weight: 165 lb (74.8 kg)  Height: 5\' 5"  (1.651 m)   Body mass index is 27.46 kg/m.     09/04/2023    3:27 PM 03/12/2022    9:02 AM 05/06/2021    9:26 AM 03/09/2021    9:07  AM 12/12/2017    3:19 PM  Advanced Directives  Does Patient Have a Medical Advance Directive? No No No No Yes  Type of Agricultural consultant;Living will  Would patient like information on creating a medical advance directive? No - Patient declined No - Patient declined No - Patient declined No - Patient declined     Current Medications (verified) Outpatient Encounter Medications as of 09/04/2023  Medication Sig   albuterol  (VENTOLIN  HFA) 108 (90 Base) MCG/ACT inhaler Inhale 2 puffs into the lungs every 6 (six) hours as needed for wheezing or shortness of breath.   alendronate  (FOSAMAX ) 70 MG tablet Take 1 tablet (70 mg total) by mouth every 7 (seven) days. Take with a full glass of water on an empty stomach.   amLODipine  (NORVASC ) 2.5 MG tablet Take 1 tablet (2.5 mg total) by mouth daily.   aspirin EC 325 MG tablet Take 325 mg by mouth daily.   atorvastatin  (LIPITOR) 80 MG tablet Take 1 tablet by mouth once daily   Calcium  Carb-Cholecalciferol  (CALTRATE 600+D3) 600-20 MG-MCG TABS Take 1 tablet by mouth 2 (two) times daily.   estradiol  (ESTRACE  VAGINAL) 0.1 MG/GM vaginal cream Insert 2 g daily intravaginally for 2 weeks, followed by a maintenance dose of 1 g two   fluticasone -salmeterol (WIXELA INHUB) 250-50 MCG/ACT AEPB Inhale 1 puff into the lungs in the morning and at bedtime.   hydrochlorothiazide  (HYDRODIURIL ) 25 MG tablet Take 1 tablet (25 mg total) by mouth daily.   lisinopril  (ZESTRIL ) 20 MG tablet Take 1 tablet (20  mg total) by mouth daily.   pantoprazole  (PROTONIX ) 40 MG tablet Take 1 tablet (40 mg total) by mouth daily.   potassium chloride  SA (KLOR-CON  M) 20 MEQ tablet Take 1 tablet (20 mEq total) by mouth daily.   vitamin B-12 (CYANOCOBALAMIN) 1000 MCG tablet Take 1 tablet (1,000 mcg total) by mouth daily.   No facility-administered encounter medications on file as of 09/04/2023.    Allergies (verified) Augmentin  [amoxicillin -pot clavulanate],  Ciprofloxacin , Keflex  [cephalexin ], Macrobid  [nitrofurantoin ], Morphine and codeine, and Sulfa antibiotics   History: Past Medical History:  Diagnosis Date   Colitis    CVA (cerebral vascular accident) (HCC) 05/2020   Hypertension    Past Surgical History:  Procedure Laterality Date   ABDOMINAL HYSTERECTOMY     ELBOW SURGERY Right 2011   tennis elbow   FEMORAL HERNIA REPAIR  02/2022   FOOT SURGERY Right    big toe- pinning due to fracture   HAND SURGERY Left    thumb surgery   UMBILICAL HERNIA REPAIR  02/2022   Family History  Problem Relation Age of Onset   Lung cancer Mother    Lung cancer Father    Obesity Sister    Hypertension Sister        Dawn Lowery   Lung cancer Paternal Grandfather    Social History   Socioeconomic History   Marital status: Divorced    Spouse name: Not on file   Number of children: Not on file   Years of education: Not on file   Highest education level: 12th grade  Occupational History   Not on file  Tobacco Use   Smoking status: Former    Current packs/day: 0.00    Average packs/day: 0.5 packs/day for 20.0 years (10.0 ttl pk-yrs)    Types: Cigarettes    Start date: 05/18/2001    Quit date: 05/18/2021    Years since quitting: 2.2   Smokeless tobacco: Never  Substance and Sexual Activity   Alcohol use: Not Currently   Drug use: Never   Sexual activity: Not Currently  Other Topics Concern   Not on file  Social History Narrative   Retired Engineering geologist   Son- lives in [place]    Daughter lives locally   5 grandchildren and 1 great grandson   Enjoys spending time with her daughter and relaxing.    2 dogs   Single   Completed HS   Social Drivers of Corporate investment banker Strain: Low Risk  (09/04/2023)   Overall Financial Resource Strain (CARDIA)    Difficulty of Paying Living Expenses: Not very hard  Food Insecurity: Food Insecurity Present (09/04/2023)   Hunger Vital Sign    Worried About Running Out of Food in the  Last Year: Sometimes true    Ran Out of Food in the Last Year: Sometimes true  Transportation Needs: No Transportation Needs (09/04/2023)   PRAPARE - Administrator, Civil Service (Medical): No    Lack of Transportation (Non-Medical): No  Physical Activity: Sufficiently Active (09/04/2023)   Exercise Vital Sign    Days of Exercise per Week: 7 days    Minutes of Exercise per Session: 40 min  Stress: No Stress Concern Present (09/04/2023)   Dawn Lowery    Feeling of Stress : Not at all  Social Connections: Socially Isolated (09/04/2023)   Social Connection and Isolation Panel [NHANES]    Frequency of Communication with Friends and Family: More than three  times a week    Frequency of Social Gatherings with Friends and Family: Twice a week    Attends Religious Services: Never    Database administrator or Organizations: No    Attends Engineer, structural: Never    Marital Status: Divorced    Tobacco Counseling Counseling given: Not Answered    Clinical Intake:  Pre-visit preparation completed: Yes  Pain : No/denies pain     BMI - recorded: 27.46 Nutritional Status: BMI 25 -29 Overweight Nutritional Risks: None Diabetes: No  No results found for: "HGBA1C"   How often do you need to have someone help you when you read instructions, pamphlets, or other written materials from your doctor or pharmacy?: 1 - Never What is the last grade level you completed in school?: hs grad  Interpreter Needed?: No  Information entered by :: [employer]   Activities of Daily Living     09/04/2023    3:30 PM  In your present state of health, do you have any difficulty performing the following activities:  Hearing? 0  Vision? 0  Difficulty concentrating or making decisions? 0  Walking or climbing stairs? 0  Dressing or bathing? 0  Doing errands, shopping? 0  Preparing Food and eating ? N  Using  the Toilet? N  In the past six months, have you accidently leaked urine? N  Do you have problems with loss of bowel control? N  Managing your Medications? N  Managing your Finances? N  Housekeeping or managing your Housekeeping? N    Patient Care Team: Dorrene Gaucher, NP as PCP - General (Internal Medicine)  Indicate any recent Medical Services you may have received from other than [facility] providers in the past year (date may be approximate).     Assessment:   This is a routine wellness examination for Dawn Lowery.  Hearing/Vision screen Hearing Screening - Comments:: Patient states no hearing issues Vision Screening - Comments:: Patient wears contacts   Goals Addressed             This Visit's Progress    Patient Stated   On track    Would like to walk more       Depression Screen     09/04/2023    3:32 PM 06/10/2023    9:01 AM 03/12/2022    9:04 AM 03/09/2021    9:10 AM 03/06/2021    9:55 AM 01/29/2020    2:09 PM  PHQ 2/9 Scores  PHQ - 2 Score 0 0 0 0 0 0  PHQ- 9 Score 0 0    0    Fall Risk     09/04/2023    3:29 PM 06/10/2023    9:01 AM 03/12/2022    9:02 AM 03/09/2021    9:09 AM  Fall Risk   Falls in the past year? 0 0 1 0  Number falls in past yr: 0 0 1 0  Injury with Fall? 0 0 0 0  Risk for fall due to : No Fall Risks No Fall Risks History of fall(s)   Follow up Falls prevention discussed;Falls evaluation completed Falls evaluation completed Falls evaluation completed Falls prevention discussed    MEDICARE RISK AT HOME:  Medicare Risk at Home Any stairs in or around the home?: Yes If so, are there any without handrails?: No Home free of loose throw rugs in walkways, pet beds, electrical cords, etc?: Yes Adequate lighting in your home to reduce risk of falls?: Yes Life alert?: No  Use of a cane, walker or w/c?: No Grab bars in the bathroom?: No Shower chair or bench in shower?: No Elevated toilet seat or a handicapped toilet?: No  TIMED UP AND  GO:  Was the test performed?  No  Cognitive Function: 6CIT completed        09/04/2023    3:28 PM 03/12/2022    9:09 AM  6CIT Screen  What Year? 0 points 0 points  What month? 0 points 0 points  What time? 0 points 0 points  Count back from 20 0 points 0 points  Months in reverse 0 points 2 points  Repeat phrase 4 points 0 points  Total Score 4 points 2 points    Immunizations Immunization History  Administered Date(s) Administered   Fluad Quad(high Dose 65+) 03/06/2021, 03/06/2022, 06/24/2023   Influenza, High Dose Seasonal PF 06/23/2023   Moderna Covid-19 Fall Seasonal Vaccine 64yrs & older 06/24/2023   Moderna SARS-COV2 Booster Vaccination 08/23/2020   Moderna Sars-Covid-2 Vaccination 01/29/2020, 02/26/2020   PNEUMOCOCCAL CONJUGATE-20 12/06/2021   Pfizer Covid-19 Vaccine Bivalent Booster 65yrs & up 02/20/2021   Pfizer Sars-cov-2 Pediatric Vaccine(2mos to <24yrs) 02/20/2021   Pfizer(Comirnaty)Fall Seasonal Vaccine 12 years and older 06/23/2023   Pneumococcal Polysaccharide-23 09/02/2020   Respiratory Syncytial Virus Vaccine,Recomb Aduvanted(Arexvy) 09/27/2022   Tdap 09/02/2020, 12/02/2022   Zoster Recombinant(Shingrix ) 09/02/2020, 02/02/2021    Screening Tests Health Maintenance  Topic Date Due   INFLUENZA VACCINE  12/13/2023   COVID-19 Vaccine (4 - Mixed Product risk 2024-25 season) 12/22/2023   Medicare Annual Wellness (AWV)  09/03/2024   MAMMOGRAM  06/23/2025   Fecal DNA (Cologuard)  07/09/2026   DTaP/Tdap/Td (3 - Td or Tdap) 12/01/2032   Pneumonia Vaccine 50+ Years old  Completed   DEXA SCAN  Completed   Zoster Vaccines- Shingrix   Completed   HPV VACCINES  Aged Out   Meningococcal B Vaccine  Aged Out   Hepatitis C Screening  Discontinued    Health Maintenance  There are no preventive care reminders to display for this patient. Health Maintenance Items Addressed:  Additional Screening:  Vision Screening: Recommended annual ophthalmology exams for  early detection of glaucoma and other disorders of the eye.  Dental Screening: Recommended annual dental exams for proper oral hygiene  Community Resource Referral / Chronic Care Management: CRR required this visit?  No   CCM required this visit?  No     Plan:     I have personally reviewed and noted the following in the patient's chart:   Medical and social history Use of alcohol, tobacco or illicit drugs  Current medications and supplements including opioid prescriptions. Patient is not currently taking opioid prescriptions. Functional ability and status Nutritional status Physical activity Advanced directives List of other physicians Hospitalizations, surgeries, and ER visits in previous 12 months Vitals Screenings to include cognitive, depression, and falls Referrals and appointments  In addition, I have reviewed and discussed with patient certain preventive protocols, quality metrics, and best practice recommendations. A written personalized care plan for preventive services as well as general preventive health recommendations were provided to patient.     Freeda Jerry, [place]   09/04/2023   After Visit Summary: (MyChart) Due to this being a telephonic visit, the after visit summary with patients personalized plan was offered to patient via MyChart   Notes: Nothing significant to report at this time.

## 2023-09-20 ENCOUNTER — Ambulatory Visit: Payer: Medicare Other | Admitting: Family

## 2023-10-14 ENCOUNTER — Encounter: Payer: Self-pay | Admitting: Family

## 2023-10-15 ENCOUNTER — Other Ambulatory Visit: Payer: Self-pay

## 2023-10-15 MED ORDER — AMLODIPINE BESYLATE 2.5 MG PO TABS: 2.5000 mg | ORAL_TABLET | Freq: Every day | ORAL | 0 refills | Status: DC

## 2023-11-20 ENCOUNTER — Other Ambulatory Visit: Payer: Self-pay | Admitting: Family

## 2023-12-28 ENCOUNTER — Other Ambulatory Visit: Payer: Self-pay | Admitting: Family

## 2023-12-28 DIAGNOSIS — K219 Gastro-esophageal reflux disease without esophagitis: Secondary | ICD-10-CM

## 2024-01-10 ENCOUNTER — Other Ambulatory Visit: Payer: Self-pay | Admitting: Family

## 2024-01-15 ENCOUNTER — Other Ambulatory Visit: Payer: Self-pay | Admitting: Family

## 2024-01-15 DIAGNOSIS — I10 Essential (primary) hypertension: Secondary | ICD-10-CM

## 2024-01-16 ENCOUNTER — Other Ambulatory Visit: Payer: Self-pay | Admitting: Family

## 2024-01-16 DIAGNOSIS — I10 Essential (primary) hypertension: Secondary | ICD-10-CM

## 2024-01-24 ENCOUNTER — Ambulatory Visit: Payer: Self-pay

## 2024-01-24 ENCOUNTER — Other Ambulatory Visit: Payer: Self-pay | Admitting: Family

## 2024-01-24 ENCOUNTER — Ambulatory Visit (INDEPENDENT_AMBULATORY_CARE_PROVIDER_SITE_OTHER): Admitting: Family

## 2024-01-24 VITALS — BP 145/95 | HR 56 | Temp 98.8°F | Resp 16 | Ht 65.0 in | Wt 170.0 lb

## 2024-01-24 DIAGNOSIS — N390 Urinary tract infection, site not specified: Secondary | ICD-10-CM

## 2024-01-24 DIAGNOSIS — E538 Deficiency of other specified B group vitamins: Secondary | ICD-10-CM

## 2024-01-24 DIAGNOSIS — E785 Hyperlipidemia, unspecified: Secondary | ICD-10-CM

## 2024-01-24 DIAGNOSIS — R8290 Unspecified abnormal findings in urine: Secondary | ICD-10-CM | POA: Diagnosis not present

## 2024-01-24 DIAGNOSIS — B078 Other viral warts: Secondary | ICD-10-CM | POA: Diagnosis not present

## 2024-01-24 DIAGNOSIS — I639 Cerebral infarction, unspecified: Secondary | ICD-10-CM

## 2024-01-24 DIAGNOSIS — Z23 Encounter for immunization: Secondary | ICD-10-CM | POA: Diagnosis not present

## 2024-01-24 DIAGNOSIS — K219 Gastro-esophageal reflux disease without esophagitis: Secondary | ICD-10-CM

## 2024-01-24 DIAGNOSIS — J449 Chronic obstructive pulmonary disease, unspecified: Secondary | ICD-10-CM

## 2024-01-24 DIAGNOSIS — I10 Essential (primary) hypertension: Secondary | ICD-10-CM

## 2024-01-24 LAB — BASIC METABOLIC PANEL WITH GFR
BUN: 9 mg/dL (ref 6–23)
CO2: 33 meq/L — ABNORMAL HIGH (ref 19–32)
Calcium: 9.9 mg/dL (ref 8.4–10.5)
Chloride: 98 meq/L (ref 96–112)
Creatinine, Ser: 0.77 mg/dL (ref 0.40–1.20)
GFR: 76.67 mL/min (ref >60.00)
Glucose, Bld: 74 mg/dL (ref 70–99)
Potassium: 4 meq/L (ref 3.5–5.1)
Sodium: 140 meq/L (ref 135–145)

## 2024-01-24 LAB — POC URINALSYSI DIPSTICK (AUTOMATED)
Blood, UA: NEGATIVE
Glucose, UA: NEGATIVE
Ketones, UA: NEGATIVE
Nitrite, UA: NEGATIVE
Protein, UA: NEGATIVE
Spec Grav, UA: <=1.005 — AB (ref 1.010–1.025)
Urobilinogen, UA: 0.2 U/dL
pH, UA: 7.5 (ref 5.0–8.0)

## 2024-01-24 MED ORDER — ALBUTEROL SULFATE HFA 108 (90 BASE) MCG/ACT IN AERS: 2.0000 | INHALATION_SPRAY | Freq: Four times a day (QID) | RESPIRATORY_TRACT | 3 refills | Status: AC | PRN

## 2024-01-24 MED ORDER — HYDROCHLOROTHIAZIDE 25 MG PO TABS: 25.0000 mg | ORAL_TABLET | Freq: Every day | ORAL | 1 refills | Status: DC

## 2024-01-24 MED ORDER — CEFDINIR 300 MG PO CAPS: 300.0000 mg | ORAL_CAPSULE | Freq: Two times a day (BID) | ORAL | 0 refills | Status: DC

## 2024-01-24 MED ORDER — POTASSIUM CHLORIDE CRYS ER 20 MEQ PO TBCR: 20.0000 meq | EXTENDED_RELEASE_TABLET | Freq: Every day | ORAL | 1 refills | Status: AC

## 2024-01-24 MED ORDER — ESTRADIOL 0.1 MG/GM VA CREA: TOPICAL_CREAM | VAGINAL | 12 refills | Status: DC

## 2024-01-24 MED ORDER — PANTOPRAZOLE SODIUM 40 MG PO TBEC: 40.0000 mg | DELAYED_RELEASE_TABLET | Freq: Every day | ORAL | 1 refills | Status: DC

## 2024-01-24 MED ORDER — AMLODIPINE BESYLATE 2.5 MG PO TABS: 2.5000 mg | ORAL_TABLET | Freq: Every day | ORAL | 0 refills | Status: DC

## 2024-01-24 MED ORDER — ATORVASTATIN CALCIUM 80 MG PO TABS: 80.0000 mg | ORAL_TABLET | Freq: Every day | ORAL | 0 refills | Status: DC

## 2024-01-24 MED ORDER — LISINOPRIL 20 MG PO TABS: 20.0000 mg | ORAL_TABLET | Freq: Every day | ORAL | 0 refills | Status: DC

## 2024-01-24 MED ORDER — FLUTICASONE-SALMETEROL 250-50 MCG/ACT IN AEPB: 1.0000 | INHALATION_SPRAY | Freq: Two times a day (BID) | RESPIRATORY_TRACT | 5 refills | Status: AC

## 2024-01-24 NOTE — Telephone Encounter (Signed)
 I have again sent the estradiol  prescription clarification request to [facility] high priority.

## 2024-01-24 NOTE — Assessment & Plan Note (Signed)
 UA notes moderate Leuks.  Will send for culture and begin omnicef  which she has tolerated in the past.  Recommended that she start estrace  cream as well to see if this helps prevent future urinary tract infections.

## 2024-01-24 NOTE — Assessment & Plan Note (Signed)
 Stable on prn albuterol .  Has not needed to use advair .

## 2024-01-24 NOTE — Assessment & Plan Note (Signed)
Continues b12 supplement.  ?

## 2024-01-24 NOTE — Telephone Encounter (Signed)
 FYI Only or Action Required?: Action required by provider: Direction Clarification for medication.  Patient was last seen in primary care on 01/24/2024 by Daryl Setter, NP.  Called Nurse Triage reporting Medication Problem.  Symptoms began today.  Interventions attempted: Other: n/a.  Symptoms are: n/a.  Triage Disposition: Information or Advice Only Call  Patient/caregiver understands and will follow disposition?: Yes Reason for Disposition  General information question, no triage required and triager able to answer question  Answer Assessment - Initial Assessment Questions Called CAL and spoke to Kaylin, stated they sent the message over to [facility] today, but she is in clinic and has not seen her messages yet, stated she will hopefully address it before she leaves today or over the weekend.   1. REASON FOR CALL: What is the main reason for your call? or How can I best help you?     Augustin from [employer] calling for clarification directions for estradiol  (ESTRACE  VAGINAL) 0.1 MG/GM vaginal cream.  Directions are as followed : Insert 2 g daily intravaginally for 2 weeks, followed by a maintenance dose of 1 g two  Protocols used: Information Only Call - No Triage-A-AH  Copied from CRM [id number]. Topic: Clinical - Medication Question >> Jan 24, 2024  4:37 PM Drema MATSU wrote: Reason for CRM: Augustin with [facility] is needing clarification on the directions for the estradiol  (ESTRACE  VAGINAL) 0.1 MG/GM vaginal cream.  Patient is at pharmacy.

## 2024-01-24 NOTE — Assessment & Plan Note (Signed)
 Stable on pantoprazole, continue same.

## 2024-01-24 NOTE — Assessment & Plan Note (Signed)
 BP up today but she ran out of amlodipine  and plans to restart today. Continue amlodipine , lisinopril , hydrochlorothiazide .

## 2024-01-24 NOTE — Telephone Encounter (Signed)
 Pharmacy calling to check status of prescription. They are needing clarification.

## 2024-01-24 NOTE — Assessment & Plan Note (Signed)
 Lab Results  Component Value Date   CHOL 138 06/24/2023   HDL 54.90 06/24/2023   LDLCALC 66 06/24/2023   TRIG 84.0 06/24/2023   CHOLHDL 3 06/24/2023   Stable on atorvastatin , continue same.

## 2024-01-24 NOTE — Telephone Encounter (Signed)
 Pharmacy comment: Please clarify the quantity prescribed for this prescription. maintenance dose of 1 gram how often?

## 2024-01-24 NOTE — Assessment & Plan Note (Addendum)
 Left middle finger. After verbal consent, wart was frozen/thawed x 3 using liquid nitrogen. Pt tolerated procedure well.

## 2024-01-24 NOTE — Patient Instructions (Signed)
 VISIT SUMMARY:  During your visit, we addressed your recurrent urinary tract infections, menopausal symptoms, high blood pressure, and a few other health concerns. We have prescribed medications and provided instructions to help manage these issues.  YOUR PLAN:  URINARY TRACT INFECTION: You have a recurrent urinary tract infection with symptoms of foul-smelling urine and lower abdominal pain. -We have sent your urine for culture to identify the best antibiotic. -Start taking cefdinir  300 mg immediately as you have tolerated it well in the past. -We may need to change the antibiotic based on the culture results.  MENOPAUSAL ATROPHIC VAGINITIS: Post-menopausal changes are contributing to your recurrent UTIs. -Restart using the topical estrogen cream with 2 grams daily for one week, then 1 gram twice a week. -We have sent a prescription for the estrogen cream to your neighborhood [place].  HYPERTENSION: Your blood pressure is not well-controlled, likely due to running out of amlodipine . -We have resent your prescription for amlodipine . -Check with your pharmacy regarding any refill issues. -Consider paying cash if your insurance does not cover an early refill. -We will recheck your potassium levels.  ASTHMA: Your asthma is well-controlled with no recent need for your inhaler.  BENIGN SKIN LESION: You have a benign skin lesion that has increased in size and is itching on your abdomen. -We will schedule a separate appointment to remove the lesion.  VERRUCA (WART): You have a persistent wart with partial improvement. -We performed cryotherapy on the wart today. -You can take Tylenol for any discomfort after the procedure.

## 2024-01-24 NOTE — Assessment & Plan Note (Signed)
 Continues aspirin and atorvastatin  for secondary stroke prevention.

## 2024-01-24 NOTE — Progress Notes (Signed)
 Subjective:     Patient ID: Dawn Lowery, female    DOB: 14-Aug-1950, 73 y.o.   MRN: [id number]  Chief Complaint  Patient presents with   Abdominal Pain    Patient complains of suprapubic pain with malodorous urine   Nevus    Patient reports mole on abdominal area     Abdominal Pain    Discussed the use of AI scribe software for clinical note transcription with the patient, who gave verbal consent to proceed.  History of Present Illness  Dawn Lowery is a 73 year old female with recurrent urinary tract infections who presents with foul-smelling urine and lower abdominal pain.  She experiences foul-smelling urine and lower abdominal pain that began this morning, with symptoms intensifying and causing nausea. She has recurrent urinary tract infections, treated previously with cefdinir  300 mg. She is concerned about recurrence despite proper hygiene and suspects menopause as a factor. She has an rx for topical estrogen cream but is not currently using.  She feels discomfort in the lower abdomen and lower back. Her medications include aspirin 325 mg, atorvastatin , a B12 supplement, a heart medication, and Caltrate. She has been out of amlodipine  for over a week, which may contribute to elevated blood pressure. She manages her medications with a daily system.      Health Maintenance Due  Topic Date Due   COVID-19 Vaccine (4 - Mixed Product risk 2024-25 season) 01/13/2024    Past Medical History:  Diagnosis Date   Colitis    CVA (cerebral vascular accident) (HCC) 05/2020   Hypertension     Past Surgical History:  Procedure Laterality Date   ABDOMINAL HYSTERECTOMY     ELBOW SURGERY Right 2011   tennis elbow   FEMORAL HERNIA REPAIR  02/2022   FOOT SURGERY Right    big toe- pinning due to fracture   HAND SURGERY Left    thumb surgery   UMBILICAL HERNIA REPAIR  02/2022    Family History  Problem Relation Age of Onset   Lung cancer Mother    Lung cancer Father     Obesity Sister    Hypertension Sister        Illene Birch   Lung cancer Paternal Grandfather     Social History   Socioeconomic History   Marital status: Divorced    Spouse name: Not on file   Number of children: Not on file   Years of education: Not on file   Highest education level: 12th grade  Occupational History   Not on file  Tobacco Use   Smoking status: Former    Current packs/day: 0.00    Average packs/day: 0.5 packs/day for 20.0 years (10.0 ttl pk-yrs)    Types: Cigarettes    Start date: 05/18/2001    Quit date: 05/18/2021    Years since quitting: 2.6   Smokeless tobacco: Never  Substance and Sexual Activity   Alcohol use: Not Currently   Drug use: Never   Sexual activity: Not Currently  Other Topics Concern   Not on file  Social History Narrative   Retired Engineering geologist   Son- lives in [place]    Daughter lives locally   5 grandchildren and 1 great grandson   Enjoys spending time with her daughter and relaxing.    2 dogs   Single   Completed HS   Social Drivers of Corporate investment banker Strain: Low Risk  (01/17/2024)   Overall Financial Resource Strain (CARDIA)  Difficulty of Paying Living Expenses: Not hard at all  Food Insecurity: No Food Insecurity (01/17/2024)   Hunger Vital Sign    Worried About Running Out of Food in the Last Year: Never true    Ran Out of Food in the Last Year: Never true  Transportation Needs: No Transportation Needs (01/17/2024)   PRAPARE - Administrator, Civil Service (Medical): No    Lack of Transportation (Non-Medical): No  Physical Activity: Sufficiently Active (01/17/2024)   Exercise Vital Sign    Days of Exercise per Week: 7 days    Minutes of Exercise per Session: 30 min  Stress: No Stress Concern Present (01/17/2024)   [employer] of Occupational Health - Occupational Stress Questionnaire    Feeling of Stress: Not at all  Social Connections: Socially Isolated (01/17/2024)   Social Connection  and Isolation Panel    Frequency of Communication with Friends and Family: More than three times a week    Frequency of Social Gatherings with Friends and Family: Three times a week    Attends Religious Services: Never    Active Member of Clubs or Organizations: No    Attends Banker Meetings: Not on file    Marital Status: Divorced  Intimate Partner Violence: Not At Risk (09/04/2023)   Humiliation, Afraid, Rape, and Kick questionnaire    Fear of Current or Ex-Partner: No    Emotionally Abused: No    Physically Abused: No    Sexually Abused: No    Outpatient Medications Prior to Visit  Medication Sig Dispense Refill   alendronate  (FOSAMAX ) 70 MG tablet Take 1 tablet (70 mg total) by mouth every 7 (seven) days. Take with a full glass of water on an empty stomach. 4 tablet 11   aspirin EC 325 MG tablet Take 325 mg by mouth daily.     Calcium  Carb-Cholecalciferol  (CALTRATE 600+D3) 600-20 MG-MCG TABS Take 1 tablet by mouth 2 (two) times daily.     vitamin B-12 (CYANOCOBALAMIN) 1000 MCG tablet Take 1 tablet (1,000 mcg total) by mouth daily.     albuterol  (VENTOLIN  HFA) 108 (90 Base) MCG/ACT inhaler Inhale 2 puffs into the lungs every 6 (six) hours as needed for wheezing or shortness of breath. 8 g 3   amLODipine  (NORVASC ) 2.5 MG tablet Take 1 tablet by mouth once daily 90 tablet 0   atorvastatin  (LIPITOR) 80 MG tablet Take 1 tablet by mouth once daily 90 tablet 0   estradiol  (ESTRACE  VAGINAL) 0.1 MG/GM vaginal cream Insert 2 g daily intravaginally for 2 weeks, followed by a maintenance dose of 1 g two 42.5 g 12   fluticasone -salmeterol (WIXELA INHUB) 250-50 MCG/ACT AEPB Inhale 1 puff into the lungs in the morning and at bedtime. 60 each 5   hydrochlorothiazide  (HYDRODIURIL ) 25 MG tablet Take 1 tablet (25 mg total) by mouth daily. 90 tablet 1   lisinopril  (ZESTRIL ) 20 MG tablet Take 1 tablet by mouth once daily 90 tablet 0   pantoprazole  (PROTONIX ) 40 MG tablet Take 1 tablet by  mouth once daily 90 tablet 0   potassium chloride  SA (KLOR-CON  M) 20 MEQ tablet Take 1 tablet (20 mEq total) by mouth daily. Needs appt 30 tablet 0   No facility-administered medications prior to visit.    Allergies  Allergen Reactions   Augmentin  [Amoxicillin -Pot Clavulanate] Hives and Itching   Ciprofloxacin      ? Chest tightness   Keflex  [Cephalexin ]     itching   Macrobid  [Nitrofurantoin ]     ?  Chest tightness   Morphine And Codeine Nausea And Vomiting   Sulfa Antibiotics Hives    Review of Systems  Gastrointestinal:  Positive for abdominal pain.       Objective:    Physical Exam Constitutional:      General: She is not in acute distress.    Appearance: Normal appearance. She is well-developed.  HENT:     Head: Normocephalic and atraumatic.     Right Ear: External ear normal.     Left Ear: External ear normal.  Eyes:     General: No scleral icterus. Neck:     Thyroid: No thyromegaly.  Cardiovascular:     Rate and Rhythm: Normal rate and regular rhythm.     Heart sounds: Normal heart sounds. No murmur heard. Pulmonary:     Effort: Pulmonary effort is normal. No respiratory distress.     Breath sounds: Normal breath sounds. No wheezing.  Musculoskeletal:     Cervical back: Neck supple.  Skin:    General: Skin is warm and dry.     Comments: Large raised darkened lesion noted left side of abdomen, approximately 1.5 cm wide.  Neurological:     Mental Status: She is alert and oriented to person, place, and time.  Psychiatric:        Mood and Affect: Mood normal.        Behavior: Behavior normal.        Thought Content: Thought content normal.        Judgment: Judgment normal.      BP (!) 145/95   Pulse (!) 56   Temp 98.8 F (37.1 C) (Oral)   Resp 16   Ht 5' 5 (1.651 m)   Wt 170 lb (77.1 kg)   SpO2 100%   BMI 28.29 kg/m  Wt Readings from Last 3 Encounters:  01/24/24 170 lb (77.1 kg)  09/04/23 165 lb (74.8 kg)  08/09/23 170 lb (77.1 kg)        Assessment & Plan:   Problem List Items Addressed This Visit       Unprioritized   Recurrent UTI   UA notes moderate Leuks.  Will send for culture and begin omnicef  which she has tolerated in the past.  Recommended that she start estrace  cream as well to see if this helps prevent future urinary tract infections.      Relevant Medications   estradiol  (ESTRACE  VAGINAL) 0.1 MG/GM vaginal cream   cefdinir  (OMNICEF ) 300 MG capsule   Hyperlipidemia   Lab Results  Component Value Date   CHOL 138 06/24/2023   HDL 54.90 06/24/2023   LDLCALC 66 06/24/2023   TRIG 84.0 06/24/2023   CHOLHDL 3 06/24/2023   Stable on atorvastatin , continue same.       Relevant Medications   lisinopril  (ZESTRIL ) 20 MG tablet   hydrochlorothiazide  (HYDRODIURIL ) 25 MG tablet   atorvastatin  (LIPITOR) 80 MG tablet   amLODipine  (NORVASC ) 2.5 MG tablet   GERD (gastroesophageal reflux disease)   Stable on pantoprazole , continue same.      Relevant Medications   pantoprazole  (PROTONIX ) 40 MG tablet   Essential hypertension   BP up today but she ran out of amlodipine  and plans to restart today. Continue amlodipine , lisinopril , hydrochlorothiazide .      Relevant Medications   potassium chloride  SA (KLOR-CON  M) 20 MEQ tablet   lisinopril  (ZESTRIL ) 20 MG tablet   hydrochlorothiazide  (HYDRODIURIL ) 25 MG tablet   atorvastatin  (LIPITOR) 80 MG tablet   amLODipine  (NORVASC ) 2.5 MG  tablet   Other Relevant Orders   Basic Metabolic Panel (BMET)   CVA (cerebral vascular accident) (HCC)   Continues aspirin and atorvastatin  for secondary stroke prevention.       Relevant Medications   lisinopril  (ZESTRIL ) 20 MG tablet   hydrochlorothiazide  (HYDRODIURIL ) 25 MG tablet   atorvastatin  (LIPITOR) 80 MG tablet   amLODipine  (NORVASC ) 2.5 MG tablet   COPD (chronic obstructive pulmonary disease) (HCC)   Stable on prn albuterol .  Has not needed to use advair .       Relevant Medications   albuterol  (VENTOLIN  HFA) 108  (90 Base) MCG/ACT inhaler   fluticasone -salmeterol (WIXELA INHUB) 250-50 MCG/ACT AEPB   Common wart   Left middle finger. After verbal consent, wart was frozen/thawed x 3 using liquid nitrogen. Pt tolerated procedure well.       Relevant Medications   cefdinir  (OMNICEF ) 300 MG capsule   B12 deficiency   Continues b12 supplement.       Other Visit Diagnoses       Bad odor of urine    -  Primary   Relevant Orders   POCT Urinalysis Dipstick (Automated) (Completed)   Urine Culture     Needs flu shot       Relevant Orders   Flu vaccine HIGH DOSE PF(Fluzone Trivalent) (Completed)       I have changed Sharlet PARAS. Cade [name]'s lisinopril , pantoprazole , atorvastatin , and amLODipine . I am also having her start on cefdinir . Additionally, I am having her maintain her cyanocobalamin, aspirin EC, alendronate , Calcium  Carb-Cholecalciferol , estradiol , albuterol , potassium chloride  SA, hydrochlorothiazide , and fluticasone -salmeterol.  Meds ordered this encounter  Medications   estradiol  (ESTRACE  VAGINAL) 0.1 MG/GM vaginal cream    Sig: Insert 2 g daily intravaginally for 2 weeks, followed by a maintenance dose of 1 g two    Dispense:  42.5 g    Refill:  12    Supervising Provider:   DOMENICA BLACKBIRD A [[id number]]   albuterol  (VENTOLIN  HFA) 108 (90 Base) MCG/ACT inhaler    Sig: Inhale 2 puffs into the lungs every 6 (six) hours as needed for wheezing or shortness of breath.    Dispense:  8 g    Refill:  3    Supervising Provider:   DOMENICA BLACKBIRD A [[id number]]   potassium chloride  SA (KLOR-CON  M) 20 MEQ tablet    Sig: Take 1 tablet (20 mEq total) by mouth daily. Needs appt    Dispense:  90 tablet    Refill:  1    Supervising Provider:   DOMENICA BLACKBIRD A [[id number]]   lisinopril  (ZESTRIL ) 20 MG tablet    Sig: Take 1 tablet (20 mg total) by mouth daily.    Dispense:  90 tablet    Refill:  0    Supervising Provider:   DOMENICA BLACKBIRD A [[id number]]   hydrochlorothiazide  (HYDRODIURIL ) 25 MG tablet    Sig: Take 1  tablet (25 mg total) by mouth daily.    Dispense:  90 tablet    Refill:  1    Supervising Provider:   DOMENICA BLACKBIRD A [[id number]]   pantoprazole  (PROTONIX ) 40 MG tablet    Sig: Take 1 tablet (40 mg total) by mouth daily.    Dispense:  90 tablet    Refill:  1    Supervising Provider:   DOMENICA BLACKBIRD A [[id number]]   fluticasone -salmeterol (WIXELA INHUB) 250-50 MCG/ACT AEPB    Sig: Inhale 1 puff into the lungs in the morning and at bedtime.    Dispense:  60 each    Refill:  5    Supervising Provider:   DOMENICA BLACKBIRD A [[id number]]   atorvastatin  (LIPITOR) 80 MG tablet    Sig: Take 1 tablet (80 mg total) by mouth daily.    Dispense:  90 tablet    Refill:  0    Supervising Provider:   DOMENICA BLACKBIRD A [[id number]]   amLODipine  (NORVASC ) 2.5 MG tablet    Sig: Take 1 tablet (2.5 mg total) by mouth daily.    Dispense:  90 tablet    Refill:  0    Supervising Provider:   DOMENICA BLACKBIRD A [[id number]]   cefdinir  (OMNICEF ) 300 MG capsule    Sig: Take 1 capsule (300 mg total) by mouth 2 (two) times daily.    Dispense:  10 capsule    Refill:  0    Supervising Provider:   DOMENICA BLACKBIRD A [[id number]]

## 2024-01-26 LAB — URINE CULTURE
MICRO NUMBER:: 16960849
SPECIMEN QUALITY:: ADEQUATE

## 2024-01-27 ENCOUNTER — Ambulatory Visit: Payer: Self-pay | Admitting: Family

## 2024-01-31 ENCOUNTER — Other Ambulatory Visit: Payer: Self-pay | Admitting: Family

## 2024-01-31 ENCOUNTER — Ambulatory Visit (INDEPENDENT_AMBULATORY_CARE_PROVIDER_SITE_OTHER): Admitting: Family

## 2024-01-31 ENCOUNTER — Encounter: Payer: Self-pay | Admitting: Family

## 2024-01-31 VITALS — BP 139/81 | HR 58 | Ht 65.0 in | Wt 170.2 lb

## 2024-01-31 DIAGNOSIS — D229 Melanocytic nevi, unspecified: Secondary | ICD-10-CM

## 2024-01-31 DIAGNOSIS — I10 Essential (primary) hypertension: Secondary | ICD-10-CM

## 2024-01-31 DIAGNOSIS — L820 Inflamed seborrheic keratosis: Secondary | ICD-10-CM | POA: Diagnosis not present

## 2024-01-31 NOTE — Progress Notes (Addendum)
 Subjective:     Patient ID: Dawn Lowery, female    DOB: 05-28-1950, 73 y.o.   MRN: [id number]  Chief Complaint  Patient presents with   Follow-up    HPI  Discussed the use of AI scribe software for clinical note transcription with the patient, who gave verbal consent to proceed.  History of Present Illness  Dawn Lowery is a 73 year old female who presents for follow-up of hypertension and mole removal.  Her blood pressure was elevated at the last visit due to running out of amlodipine , but she has since restarted the medication after resolving a pharmacy issue. She was under the impression that her mole removal was scheduled for today, but there was a scheduling misunderstanding. She recently received a flu shot, which caused mild fever and malaise for two days, but she recovered quickly. Her granddaughter and husband had COVID, and she is awaiting the COVID vaccine at her pharmacy.     There are no preventive care reminders to display for this patient.   Past Medical History:  Diagnosis Date   Colitis    COPD (chronic obstructive pulmonary disease) (HCC)    CVA (cerebral vascular accident) (HCC) 05/2020   Hypertension    Osteoporosis     Past Surgical History:  Procedure Laterality Date   ABDOMINAL HYSTERECTOMY     ELBOW SURGERY Right 2011   tennis elbow   FEMORAL HERNIA REPAIR  02/2022   FOOT SURGERY Right    big toe- pinning due to fracture   HAND SURGERY Left    thumb surgery   UMBILICAL HERNIA REPAIR  02/2022    Family History  Problem Relation Age of Onset   Lung cancer Mother    Lung cancer Father    Obesity Sister    Hypertension Sister        Illene Birch   Lung cancer Paternal Grandfather     Social History   Socioeconomic History   Marital status: Divorced    Spouse name: Not on file   Number of children: Not on file   Years of education: Not on file   Highest education level: 12th grade  Occupational History   Not on file   Tobacco Use   Smoking status: Former    Current packs/day: 0.00    Average packs/day: 0.5 packs/day for 20.0 years (10.0 ttl pk-yrs)    Types: Cigarettes    Start date: 05/18/2001    Quit date: 05/18/2021    Years since quitting: 2.7   Smokeless tobacco: Never  Substance and Sexual Activity   Alcohol use: Not Currently   Drug use: Never   Sexual activity: Not Currently  Other Topics Concern   Not on file  Social History Narrative   Retired Engineering geologist   Son- lives in [place]    Daughter lives locally   5 grandchildren and 1 great grandson   Enjoys spending time with her daughter and relaxing.    2 dogs   Single   Completed HS   Social Drivers of Corporate investment banker Strain: Low Risk  (01/17/2024)   Overall Financial Resource Strain (CARDIA)    Difficulty of Paying Living Expenses: Not hard at all  Food Insecurity: No Food Insecurity (01/17/2024)   Hunger Vital Sign    Worried About Running Out of Food in the Last Year: Never true    Ran Out of Food in the Last Year: Never true  Transportation Needs: No Transportation Needs (  01/17/2024)   PRAPARE - Administrator, Civil Service (Medical): No    Lack of Transportation (Non-Medical): No  Physical Activity: Sufficiently Active (01/17/2024)   Exercise Vital Sign    Days of Exercise per Week: 7 days    Minutes of Exercise per Session: 30 min  Stress: No Stress Concern Present (01/17/2024)   [employer] of Occupational Health - Occupational Stress Questionnaire    Feeling of Stress: Not at all  Social Connections: Socially Isolated (01/17/2024)   Social Connection and Isolation Panel    Frequency of Communication with Friends and Family: More than three times a week    Frequency of Social Gatherings with Friends and Family: Three times a week    Attends Religious Services: Never    Active Member of Clubs or Organizations: No    Attends Banker Meetings: Not on file    Marital Status: Divorced   Intimate Partner Violence: Not At Risk (09/04/2023)   Humiliation, Afraid, Rape, and Kick questionnaire    Fear of Current or Ex-Partner: No    Emotionally Abused: No    Physically Abused: No    Sexually Abused: No    Outpatient Medications Prior to Visit  Medication Sig Dispense Refill   albuterol  (VENTOLIN  HFA) 108 (90 Base) MCG/ACT inhaler Inhale 2 puffs into the lungs every 6 (six) hours as needed for wheezing or shortness of breath. 8 g 3   alendronate  (FOSAMAX ) 70 MG tablet Take 1 tablet (70 mg total) by mouth every 7 (seven) days. Take with a full glass of water on an empty stomach. 4 tablet 11   amLODipine  (NORVASC ) 2.5 MG tablet Take 1 tablet (2.5 mg total) by mouth daily. 90 tablet 0   aspirin EC 325 MG tablet Take 325 mg by mouth daily.     atorvastatin  (LIPITOR) 80 MG tablet Take 1 tablet (80 mg total) by mouth daily. 90 tablet 0   Calcium  Carb-Cholecalciferol  (CALTRATE 600+D3) 600-20 MG-MCG TABS Take 1 tablet by mouth 2 (two) times daily.     cefdinir  (OMNICEF ) 300 MG capsule Take 1 capsule (300 mg total) by mouth 2 (two) times daily. 10 capsule 0   estradiol  (ESTRACE ) 0.1 MG/GM vaginal cream INSERT 2 GRAMS DAILY INTRAVAGINALLY FOR 2 WEEKS, FOLLOWED BY A MAINTENANCE DOSE OF 1 GRAM twice weekly 43 g 12   fluticasone -salmeterol (WIXELA INHUB) 250-50 MCG/ACT AEPB Inhale 1 puff into the lungs in the morning and at bedtime. 60 each 5   hydrochlorothiazide  (HYDRODIURIL ) 25 MG tablet Take 1 tablet (25 mg total) by mouth daily. 90 tablet 1   lisinopril  (ZESTRIL ) 20 MG tablet Take 1 tablet (20 mg total) by mouth daily. 90 tablet 0   pantoprazole  (PROTONIX ) 40 MG tablet Take 1 tablet (40 mg total) by mouth daily. 90 tablet 1   potassium chloride  SA (KLOR-CON  M) 20 MEQ tablet Take 1 tablet (20 mEq total) by mouth daily. Needs appt 90 tablet 1   vitamin B-12 (CYANOCOBALAMIN) 1000 MCG tablet Take 1 tablet (1,000 mcg total) by mouth daily.     No facility-administered medications prior to  visit.    Allergies  Allergen Reactions   Augmentin  [Amoxicillin -Pot Clavulanate] Hives and Itching   Ciprofloxacin      ? Chest tightness   Keflex  [Cephalexin ]     itching   Macrobid  [Nitrofurantoin ]     ? Chest tightness   Morphine And Codeine Nausea And Vomiting   Sulfa Antibiotics Hives    ROS  See HPI Objective:    Physical Exam Constitutional:      General: She is not in acute distress.    Appearance: Normal appearance. She is well-developed.  HENT:     Head: Normocephalic and atraumatic.     Right Ear: External ear normal.     Left Ear: External ear normal.  Eyes:     General: No scleral icterus. Neck:     Thyroid: No thyromegaly.  Cardiovascular:     Rate and Rhythm: Normal rate and regular rhythm.     Heart sounds: Normal heart sounds. No murmur heard. Pulmonary:     Effort: Pulmonary effort is normal. No respiratory distress.     Breath sounds: Normal breath sounds. No wheezing.  Musculoskeletal:     Cervical back: Neck supple.  Skin:    General: Skin is warm and dry.     Comments: Large dark nevus left side of trunk, 1 cm wide lesion  Neurological:     Mental Status: She is alert and oriented to person, place, and time.  Psychiatric:        Mood and Affect: Mood normal.        Behavior: Behavior normal.        Thought Content: Thought content normal.        Judgment: Judgment normal.   Skin excision  Date/Time: 01/31/2024 3:32 PM  Performed by: Daryl Setter, NP Authorized by: Daryl Setter, NP   Number of Lesions: 1 Lesion 1:    Body area: trunk   Trunk location: abdomen   BP 139/81 (BP Location: Left Arm, Patient Position: Sitting, Cuff Size: Normal)   Pulse (!) 58   Ht 5' 5 (1.651 m)   Wt 170 lb 3.2 oz (77.2 kg)   SpO2 98%   BMI 28.32 kg/m  Wt Readings from Last 3 Encounters:  01/31/24 170 lb 3.2 oz (77.2 kg)  01/24/24 170 lb (77.1 kg)  09/04/23 165 lb (74.8 kg)       Assessment & Plan:   Problem List Items  Addressed This Visit       Unprioritized   Primary hypertension - Primary   BP Readings from Last 3 Encounters:  01/31/24 139/81  01/24/24 (!) 145/95  09/04/23 132/77   BP stable on amlodipine  and lisinopril . Continue same.      Relevant Orders   Basic Metabolic Panel (BMET) (Completed)   Nevus   Indication for removal- itching/irritation. Procedure including risks/benefits explained to patient.  Questions were answered. After informed consent was obtained and a time out completed, site was cleansed with betadine and then alcohol. 1% Lidocaine  with epinephrine was injected under lesion and then shave biopsy was performed. Area was cauterized to obtain hemostasis.  Pt tolerated procedure well.  Specimen sent for pathology review.  Pt instructed to keep the area dry for 24 hours and to contact us  if he develops redness, drainage or swelling at the site.  Pt may use tylenol as needed for discomfort today.         Relevant Orders   Skin excision (Completed)   Surgical pathology( [facility]/ [facility])    I am having Sharlet DOROTHA Litten [name] maintain her cyanocobalamin, aspirin EC, alendronate , Calcium  Carb-Cholecalciferol , albuterol , potassium chloride  SA, lisinopril , hydrochlorothiazide , pantoprazole , fluticasone -salmeterol, atorvastatin , amLODipine , cefdinir , and estradiol .  No orders of the defined types were placed in this encounter.

## 2024-02-01 ENCOUNTER — Ambulatory Visit: Payer: Self-pay | Admitting: Family

## 2024-02-01 ENCOUNTER — Encounter: Payer: Self-pay | Admitting: Family

## 2024-02-01 LAB — BASIC METABOLIC PANEL WITH GFR
BUN/Creatinine Ratio: 7 (calc) (ref 6–22)
BUN: 6 mg/dL — ABNORMAL LOW (ref 7–25)
CO2: 27 mmol/L (ref 20–32)
Calcium: 9.4 mg/dL (ref 8.6–10.4)
Chloride: 102 mmol/L (ref 98–110)
Creat: 0.83 mg/dL (ref 0.60–1.00)
Glucose, Bld: 90 mg/dL (ref 65–99)
Potassium: 4.2 mmol/L (ref 3.5–5.3)
Sodium: 137 mmol/L (ref 135–146)
eGFR: 74 mL/min/1.73m2 (ref >=60)

## 2024-02-02 NOTE — Assessment & Plan Note (Signed)
 BP Readings from Last 3 Encounters:  01/31/24 139/81  01/24/24 (!) 145/95  09/04/23 132/77   BP stable on amlodipine  and lisinopril . Continue same.

## 2024-02-02 NOTE — Assessment & Plan Note (Signed)
 SABRA

## 2024-02-02 NOTE — Assessment & Plan Note (Addendum)
 Indication for removal- itching/irritation. Procedure including risks/benefits explained to patient.  Questions were answered. After informed consent was obtained and a time out completed, site was cleansed with betadine and then alcohol. 1% Lidocaine  with epinephrine was injected under lesion and then shave biopsy was performed. Area was cauterized to obtain hemostasis.  Pt tolerated procedure well.  Specimen sent for pathology review.  Pt instructed to keep the area dry for 24 hours and to contact us  if he develops redness, drainage or swelling at the site.  Pt may use tylenol as needed for discomfort today.

## 2024-02-02 NOTE — Patient Instructions (Signed)
 VISIT SUMMARY:  Today, we addressed your hypertension and scheduled your mole removal. Your blood pressure has improved since you restarted your medication. We also discussed your recent flu shot and upcoming COVID-19 vaccination.  YOUR PLAN:  SKIN LESION (MOLE): You have a mole that needs to be removed. -We have arranged for the mole removal today. Please coordinate with Harlene for the procedure, which will involve using a shaver and lidocaine . -We will use hypercater during the procedure. -Please visit the lab during the preparation for the procedure.  HYPERTENSION: Your blood pressure was high at your last visit because you ran out of your medication, but it has improved since you restarted amlodipine . -We will check your potassium levels with a lab test.  GENERAL HEALTH MAINTENANCE: We discussed the importance of the COVID-19 vaccination, especially for individuals over 65. -We recommend you get the COVID-19 vaccination at the pharmacy downstairs.

## 2024-02-04 LAB — DERMATOLOGY PATHOLOGY

## 2024-02-06 ENCOUNTER — Ambulatory Visit: Payer: Self-pay | Admitting: Family

## 2024-02-11 ENCOUNTER — Encounter: Payer: Self-pay | Admitting: Family

## 2024-02-19 ENCOUNTER — Encounter: Payer: Self-pay | Admitting: Family

## 2024-02-19 DIAGNOSIS — R300 Dysuria: Secondary | ICD-10-CM

## 2024-02-19 DIAGNOSIS — N390 Urinary tract infection, site not specified: Secondary | ICD-10-CM

## 2024-02-19 MED ORDER — CEFDINIR 300 MG PO CAPS: 300.0000 mg | ORAL_CAPSULE | Freq: Two times a day (BID) | ORAL | 0 refills | Status: AC

## 2024-02-19 NOTE — Telephone Encounter (Signed)
 See mychart message.  Please contact pt to schedule a lab appointment today.

## 2024-02-19 NOTE — Telephone Encounter (Signed)
 Patient contacted and scheduled to come in tomorrow at 9:30

## 2024-02-20 ENCOUNTER — Other Ambulatory Visit (INDEPENDENT_AMBULATORY_CARE_PROVIDER_SITE_OTHER)

## 2024-02-20 DIAGNOSIS — R300 Dysuria: Secondary | ICD-10-CM

## 2024-02-20 LAB — URINALYSIS, ROUTINE W REFLEX MICROSCOPIC
Bilirubin Urine: NEGATIVE
Hgb urine dipstick: NEGATIVE
Ketones, ur: NEGATIVE
Nitrite: POSITIVE — AB
Specific Gravity, Urine: 1.02 (ref 1.000–1.030)
Urine Glucose: NEGATIVE
Urobilinogen, UA: 0.2 (ref 0.0–1.0)
pH: 6 (ref 5.0–8.0)

## 2024-02-23 LAB — URINE CULTURE
MICRO NUMBER:: 17078725
SPECIMEN QUALITY:: ADEQUATE

## 2024-02-24 ENCOUNTER — Ambulatory Visit: Payer: Self-pay | Admitting: Family

## 2024-02-27 ENCOUNTER — Encounter: Payer: Self-pay | Admitting: Family

## 2024-02-27 NOTE — Telephone Encounter (Signed)
 Patient notified of provider's comments and recommendations. She verbalized understanding and confirms she will go to ED this morning.

## 2024-04-17 ENCOUNTER — Other Ambulatory Visit: Payer: Self-pay | Admitting: Family

## 2024-04-17 DIAGNOSIS — I10 Essential (primary) hypertension: Secondary | ICD-10-CM

## 2024-05-14 ENCOUNTER — Other Ambulatory Visit: Payer: Self-pay | Admitting: Family

## 2024-05-14 DIAGNOSIS — E785 Hyperlipidemia, unspecified: Secondary | ICD-10-CM

## 2024-05-26 ENCOUNTER — Other Ambulatory Visit: Payer: Self-pay

## 2024-05-26 DIAGNOSIS — I10 Essential (primary) hypertension: Secondary | ICD-10-CM

## 2024-05-26 DIAGNOSIS — K219 Gastro-esophageal reflux disease without esophagitis: Secondary | ICD-10-CM

## 2024-05-26 MED ORDER — PANTOPRAZOLE SODIUM 40 MG PO TBEC: 40.0000 mg | DELAYED_RELEASE_TABLET | Freq: Every day | ORAL | 1 refills | Status: AC

## 2024-05-26 MED ORDER — HYDROCHLOROTHIAZIDE 25 MG PO TABS: 25.0000 mg | ORAL_TABLET | Freq: Every day | ORAL | 1 refills | Status: AC

## 2024-06-10 ENCOUNTER — Other Ambulatory Visit: Payer: Self-pay

## 2024-06-10 DIAGNOSIS — I10 Essential (primary) hypertension: Secondary | ICD-10-CM

## 2024-06-10 MED ORDER — LISINOPRIL 20 MG PO TABS: 20.0000 mg | ORAL_TABLET | Freq: Every day | ORAL | 0 refills | Status: AC

## 2024-09-09 ENCOUNTER — Ambulatory Visit
